# Patient Record
Sex: Male | Born: 1960 | Race: White | Hispanic: No | Marital: Married | Smoking: Never smoker
Health system: Southern US, Community
[De-identification: ages and names within clinical notes are randomized; demographics above are authoritative.]

## PROBLEM LIST (undated history)

## (undated) DIAGNOSIS — F419 Anxiety disorder, unspecified: Secondary | ICD-10-CM

## (undated) DIAGNOSIS — R12 Heartburn: Secondary | ICD-10-CM

## (undated) DIAGNOSIS — I214 Non-ST elevation (NSTEMI) myocardial infarction: Secondary | ICD-10-CM

## (undated) DIAGNOSIS — I6529 Occlusion and stenosis of unspecified carotid artery: Secondary | ICD-10-CM

## (undated) DIAGNOSIS — R0789 Other chest pain: Secondary | ICD-10-CM

## (undated) DIAGNOSIS — K219 Gastro-esophageal reflux disease without esophagitis: Secondary | ICD-10-CM

## (undated) DIAGNOSIS — E7849 Other hyperlipidemia: Secondary | ICD-10-CM

## (undated) DIAGNOSIS — E785 Hyperlipidemia, unspecified: Secondary | ICD-10-CM

## (undated) HISTORY — DX: Other chest pain: R07.89

## (undated) HISTORY — DX: Gastro-esophageal reflux disease without esophagitis: K21.9

## (undated) HISTORY — PX: HAND SURGERY: SHX662

## (undated) HISTORY — PX: TONSILLECTOMY: SUR1361

## (undated) HISTORY — DX: Occlusion and stenosis of unspecified carotid artery: I65.29

## (undated) HISTORY — DX: Anxiety disorder, unspecified: F41.9

## (undated) HISTORY — DX: Non-ST elevation (NSTEMI) myocardial infarction: I21.4

## (undated) HISTORY — PX: CERVICAL SPINE SURGERY: SHX589

---

## 2008-04-10 ENCOUNTER — Emergency Department (HOSPITAL_COMMUNITY): Admission: EM | Admit: 2008-04-10 | Discharge: 2008-04-10 | Payer: Self-pay | Admitting: Emergency Medicine

## 2009-09-24 ENCOUNTER — Encounter: Admission: RE | Admit: 2009-09-24 | Discharge: 2009-09-24 | Payer: Self-pay | Admitting: Chiropractor

## 2009-10-05 ENCOUNTER — Ambulatory Visit (HOSPITAL_COMMUNITY): Admission: RE | Admit: 2009-10-05 | Discharge: 2009-10-06 | Payer: Self-pay | Admitting: Neurological Surgery

## 2010-02-20 ENCOUNTER — Encounter: Payer: Self-pay | Admitting: Chiropractor

## 2010-04-14 LAB — COMPREHENSIVE METABOLIC PANEL
ALT: 71 U/L — ABNORMAL HIGH (ref 0–53)
Alkaline Phosphatase: 45 U/L (ref 39–117)
BUN: 16 mg/dL (ref 6–23)
CO2: 26 mEq/L (ref 19–32)
Chloride: 103 mEq/L (ref 96–112)
Creatinine, Ser: 1.05 mg/dL (ref 0.4–1.5)
Glucose, Bld: 95 mg/dL (ref 70–99)
Sodium: 136 mEq/L (ref 135–145)
Total Bilirubin: 1 mg/dL (ref 0.3–1.2)
Total Protein: 6.5 g/dL (ref 6.0–8.3)

## 2010-04-14 LAB — CBC
Hemoglobin: 15.6 g/dL (ref 13.0–17.0)
Platelets: 302 10*3/uL (ref 150–400)
RBC: 5.14 MIL/uL (ref 4.22–5.81)
RDW: 12.9 % (ref 11.5–15.5)
WBC: 10.5 10*3/uL (ref 4.0–10.5)

## 2010-05-12 LAB — COMPREHENSIVE METABOLIC PANEL
AST: 33 U/L (ref 0–37)
Albumin: 4.3 g/dL (ref 3.5–5.2)
Alkaline Phosphatase: 64 U/L (ref 39–117)
BUN: 20 mg/dL (ref 6–23)
CO2: 27 mEq/L (ref 19–32)
Calcium: 9.5 mg/dL (ref 8.4–10.5)
Chloride: 104 mEq/L (ref 96–112)
GFR calc Af Amer: >60 mL/min (ref >60)
GFR calc non Af Amer: >60 mL/min (ref >60)
Glucose, Bld: 113 mg/dL — ABNORMAL HIGH (ref 70–99)
Potassium: 4.4 mEq/L (ref 3.5–5.1)
Total Bilirubin: 0.9 mg/dL (ref 0.3–1.2)
Total Protein: 6.9 g/dL (ref 6.0–8.3)

## 2010-05-12 LAB — CBC
HCT: 44.5 % (ref 39.0–52.0)
Platelets: 270 10*3/uL (ref 150–400)
RBC: 5 MIL/uL (ref 4.22–5.81)
WBC: 5 10*3/uL (ref 4.0–10.5)

## 2010-05-12 LAB — DIFFERENTIAL
Eosinophils Absolute: 0.1 10*3/uL (ref 0.0–0.7)
Lymphs Abs: 1.6 10*3/uL (ref 0.7–4.0)
Monocytes Relative: 9 % (ref 3–12)
Neutro Abs: 2.8 10*3/uL (ref 1.7–7.7)
Neutrophils Relative %: 57 % (ref 43–77)

## 2013-01-10 ENCOUNTER — Encounter (HOSPITAL_COMMUNITY): Payer: Self-pay | Admitting: Emergency Medicine

## 2013-01-10 ENCOUNTER — Inpatient Hospital Stay (HOSPITAL_COMMUNITY)
Admission: EM | Admit: 2013-01-10 | Discharge: 2013-01-20 | DRG: 234 | Disposition: A | Discharge status: Home / Self Care | Payer: BC Managed Care – PPO | Attending: Cardiothoracic Surgery | Admitting: Cardiothoracic Surgery

## 2013-01-10 ENCOUNTER — Emergency Department (HOSPITAL_COMMUNITY): Payer: BC Managed Care – PPO

## 2013-01-10 DIAGNOSIS — I200 Unstable angina: Secondary | ICD-10-CM

## 2013-01-10 DIAGNOSIS — F411 Generalized anxiety disorder: Secondary | ICD-10-CM | POA: Diagnosis present

## 2013-01-10 DIAGNOSIS — E871 Hypo-osmolality and hyponatremia: Secondary | ICD-10-CM | POA: Diagnosis present

## 2013-01-10 DIAGNOSIS — D62 Acute posthemorrhagic anemia: Secondary | ICD-10-CM | POA: Diagnosis not present

## 2013-01-10 DIAGNOSIS — R0789 Other chest pain: Secondary | ICD-10-CM

## 2013-01-10 DIAGNOSIS — Z79899 Other long term (current) drug therapy: Secondary | ICD-10-CM

## 2013-01-10 DIAGNOSIS — D696 Thrombocytopenia, unspecified: Secondary | ICD-10-CM | POA: Diagnosis not present

## 2013-01-10 DIAGNOSIS — Z951 Presence of aortocoronary bypass graft: Secondary | ICD-10-CM

## 2013-01-10 DIAGNOSIS — I214 Non-ST elevation (NSTEMI) myocardial infarction: Principal | ICD-10-CM | POA: Diagnosis present

## 2013-01-10 DIAGNOSIS — F419 Anxiety disorder, unspecified: Secondary | ICD-10-CM

## 2013-01-10 DIAGNOSIS — I2510 Atherosclerotic heart disease of native coronary artery without angina pectoris: Secondary | ICD-10-CM | POA: Diagnosis present

## 2013-01-10 DIAGNOSIS — E8779 Other fluid overload: Secondary | ICD-10-CM | POA: Diagnosis not present

## 2013-01-10 DIAGNOSIS — K219 Gastro-esophageal reflux disease without esophagitis: Secondary | ICD-10-CM | POA: Diagnosis present

## 2013-01-10 DIAGNOSIS — E7849 Other hyperlipidemia: Secondary | ICD-10-CM

## 2013-01-10 DIAGNOSIS — E785 Hyperlipidemia, unspecified: Secondary | ICD-10-CM | POA: Diagnosis present

## 2013-01-10 DIAGNOSIS — Z789 Other specified health status: Secondary | ICD-10-CM

## 2013-01-10 DIAGNOSIS — Z7982 Long term (current) use of aspirin: Secondary | ICD-10-CM

## 2013-01-10 DIAGNOSIS — A6000 Herpesviral infection of urogenital system, unspecified: Secondary | ICD-10-CM | POA: Diagnosis present

## 2013-01-10 HISTORY — DX: Heartburn: R12

## 2013-01-10 HISTORY — DX: Other hyperlipidemia: E78.49

## 2013-01-10 HISTORY — DX: Other specified health status: Z78.9

## 2013-01-10 HISTORY — DX: Hyperlipidemia, unspecified: E78.5

## 2013-01-10 LAB — BASIC METABOLIC PANEL
BUN: 17 mg/dL (ref 6–23)
GFR calc Af Amer: 76 mL/min — ABNORMAL LOW (ref >90)

## 2013-01-10 LAB — CBC
MCV: 88 fL (ref 78.0–100.0)
WBC: 7.3 10*3/uL (ref 4.0–10.5)

## 2013-01-10 LAB — PRO B NATRIURETIC PEPTIDE: Pro B Natriuretic peptide (BNP): 25.7 pg/mL (ref 0–125)

## 2013-01-10 LAB — TROPONIN I: Troponin I: 1.62 ng/mL (ref <0.30)

## 2013-01-10 LAB — PROTIME-INR
INR: 1.03 (ref 0.00–1.49)
Prothrombin Time: 13.3 seconds (ref 11.6–15.2)

## 2013-01-10 LAB — POCT I-STAT TROPONIN I: Troponin i, poc: 0.03 ng/mL (ref 0.00–0.08)

## 2013-01-10 MED ORDER — ASPIRIN 81 MG PO CHEW
324.0000 mg | CHEWABLE_TABLET | Freq: Once | ORAL | Status: AC
  Administered 2013-01-10: 324 mg via ORAL
  Filled 2013-01-10: qty 4

## 2013-01-10 MED ORDER — ASPIRIN EC 325 MG PO TBEC
325.0000 mg | DELAYED_RELEASE_TABLET | Freq: Every day | ORAL | Status: DC
  Administered 2013-01-11: 325 mg via ORAL
  Filled 2013-01-10: qty 1

## 2013-01-10 MED ORDER — HEPARIN (PORCINE) IN NACL 100-0.45 UNIT/ML-% IJ SOLN
1200.0000 [IU]/h | INTRAMUSCULAR | Status: DC
  Administered 2013-01-11: 1200 [IU]/h via INTRAVENOUS
  Filled 2013-01-10 (×2): qty 250

## 2013-01-10 MED ORDER — ONDANSETRON HCL 4 MG PO TABS: 4.0000 mg | ORAL_TABLET | Freq: Four times a day (QID) | ORAL | Status: DC | PRN

## 2013-01-10 MED ORDER — HEPARIN BOLUS VIA INFUSION
4000.0000 [IU] | Freq: Once | INTRAVENOUS | Status: AC
  Administered 2013-01-10: 4000 [IU] via INTRAVENOUS
  Filled 2013-01-10: qty 4000

## 2013-01-10 MED ORDER — NITROGLYCERIN 0.4 MG SL SUBL
0.4000 mg | SUBLINGUAL_TABLET | SUBLINGUAL | Status: DC | PRN
  Administered 2013-01-10: 0.4 mg via SUBLINGUAL
  Filled 2013-01-10: qty 25

## 2013-01-10 MED ORDER — LORATADINE 10 MG PO TABS
10.0000 mg | ORAL_TABLET | Freq: Every day | ORAL | Status: DC
  Administered 2013-01-11 – 2013-01-14 (×4): 10 mg via ORAL
  Filled 2013-01-10 (×5): qty 1

## 2013-01-10 MED ORDER — SODIUM CHLORIDE 0.9 % IJ SOLN
3.0000 mL | Freq: Two times a day (BID) | INTRAMUSCULAR | Status: DC
  Administered 2013-01-13: 3 mL via INTRAVENOUS

## 2013-01-10 MED ORDER — ACYCLOVIR 200 MG PO CAPS
400.0000 mg | ORAL_CAPSULE | Freq: Every day | ORAL | Status: DC
  Administered 2013-01-11 – 2013-01-15 (×5): 400 mg via ORAL
  Filled 2013-01-10 (×7): qty 2

## 2013-01-10 MED ORDER — ENOXAPARIN SODIUM 40 MG/0.4ML ~~LOC~~ SOLN: 40.0000 mg | SUBCUTANEOUS | Status: DC

## 2013-01-10 MED ORDER — SODIUM CHLORIDE 0.9 % IV SOLN: 250.0000 mL | INTRAVENOUS | Status: DC | PRN

## 2013-01-10 MED ORDER — CLONAZEPAM 0.5 MG PO TABS
0.5000 mg | ORAL_TABLET | Freq: Every day | ORAL | Status: DC
  Administered 2013-01-11 – 2013-01-15 (×5): 0.5 mg via ORAL
  Filled 2013-01-10 (×5): qty 1

## 2013-01-10 MED ORDER — PANTOPRAZOLE SODIUM 40 MG PO TBEC
40.0000 mg | DELAYED_RELEASE_TABLET | Freq: Every day | ORAL | Status: DC
  Administered 2013-01-10 – 2013-01-14 (×5): 40 mg via ORAL
  Filled 2013-01-10 (×5): qty 1

## 2013-01-10 MED ORDER — ONDANSETRON HCL 4 MG/2ML IJ SOLN: 4.0000 mg | Freq: Four times a day (QID) | INTRAMUSCULAR | Status: DC | PRN

## 2013-01-10 MED ORDER — MORPHINE SULFATE 2 MG/ML IJ SOLN
2.0000 mg | INTRAMUSCULAR | Status: DC | PRN
  Administered 2013-01-13: 2 mg via INTRAVENOUS
  Filled 2013-01-10: qty 1

## 2013-01-10 MED ORDER — SODIUM CHLORIDE 0.9 % IJ SOLN
3.0000 mL | INTRAMUSCULAR | Status: DC | PRN
  Administered 2013-01-12 (×2): 3 mL via INTRAVENOUS

## 2013-01-10 MED ORDER — SODIUM CHLORIDE 0.9 % IJ SOLN
3.0000 mL | Freq: Two times a day (BID) | INTRAMUSCULAR | Status: DC
  Administered 2013-01-12 – 2013-01-13 (×2): 3 mL via INTRAVENOUS
  Administered 2013-01-14: 09:00:00 via INTRAVENOUS

## 2013-01-10 NOTE — Progress Notes (Signed)
CRITICAL VALUE ALERT  Critical value received:  Troponin 1.62  Date of notification:  01/10/13  Time of notification:  1025  Critical value read back:yes  Nurse who received alert:  Brylie Sneath, Lavone Orn, RN  MD notified (1st page):  Lynch  Time of first page:  1027  MD notified (2nd page):  Time of second page:  Responding MD:  Burnadette Peter  Time MD responded:  1029

## 2013-01-10 NOTE — Consult Note (Signed)
Admit date: 01/10/2013 Referring Physician  Dr. Blinda Leatherwood Primary Physician  Dr. Cam Hai   Primary Cardiologist  None Reason for Consultation  Chest pain and SOB  HPI: This is a 52yo WM with a history of heartburn who presented to the ER with complaints of chest pain.  His discomfort started at 3pm while out riding his bike on a mountain bike trail. He said that about 20 minutes into it he started to feel bad with SOB but he rode another 25 minutes but his chest started burning with some pain and it became  worse and he finally had to stop.  He sat down and the chest burning continued and actually got worse.  He described it has a burning sensation across his chest and felt he had a lot of acid and was spitting a lot.  This burning sensation moved into his left arm along with some numbness.  He says that for about 2-3 weeks he has had some pressure on his chest with cough when lying down in certain positions.  He has known GERD treated by ENT Dr. Pollyann Kennedy.  He denies any SOB or DOE prior to today.  He is very active and is an avid biker.  He was seen by his PCP a few weeks ago and they though he was having pleurisy and was coughing some phlegm.       PMH:   Past Medical History  Diagnosis Date  . Heart burn Dyslipidemia      PSH:   Past Surgical History  Procedure Laterality Date  . Cervical spine surgery  approx 2011    C3,C4, C5 diskectomy  . Tonsillectomy      Allergies:  Review of patient's allergies indicates no known allergies. Prior to Admit Meds:   (Not in a hospital admission) Fam HX:    Family History  Problem Relation Age of Onset  . Heart attack Father   . Heart disease Father   . Hypertension Father   . Hyperlipidemia Father    Social HX:    History   Social History  . Marital Status: Married    Spouse Name: N/A    Number of Children: N/A  . Years of Education: N/A   Occupational History  . Not on file.   Social History Main Topics  . Smoking status:  Never Smoker   . Smokeless tobacco: Never Used  . Alcohol Use: Yes     Comment: occasional  . Drug Use: No  . Sexual Activity: Not on file   Other Topics Concern  . Not on file   Social History Narrative  . No narrative on file     ROS:  All 11 ROS were addressed and are negative except what is stated in the HPI  Physical Exam: Blood pressure 126/82, pulse 59, temperature 98 F (36.7 C), temperature source Oral, resp. rate 14, SpO2 99.00%.    General: Well developed, well nourished, in no acute distress Head: Eyes PERRLA, No xanthomas.   Normal cephalic and atramatic  Lungs:   Clear bilaterally to auscultation and percussion. Heart:   HRRR S1 S2 Pulses are 2+ & equal.            No carotid bruit. No JVD.  No abdominal bruits. No femoral bruits. Abdomen: Bowel sounds are positive, abdomen soft and non-tender without masses  Extremities:   No clubbing, cyanosis or edema.  DP +1 Neuro: Alert and oriented X 3. Psych:  Good affect, responds appropriately  Labs:   Lab Results  Component Value Date   WBC 7.3 01/10/2013   HGB 14.7 01/10/2013   HCT 42.4 01/10/2013   MCV 88.0 01/10/2013   PLT 268 01/10/2013     Recent Labs Lab 01/10/13 1707  NA 132*  K 3.9  CL 95*  CO2 25  BUN 17  CREATININE 1.22  CALCIUM 9.4  GLUCOSE 125*       Radiology:  Dg Chest Port 1 View  01/10/2013   CLINICAL DATA:  Chest pain  EXAM: PORTABLE CHEST - 1 VIEW  COMPARISON:  12/23/2012  FINDINGS: The heart size and mediastinal contours are within normal limits. Both lungs are clear. The visualized skeletal structures are unremarkable.  IMPRESSION: No active disease.   Electronically Signed   By: Alcide Clever M.D.   On: 01/10/2013 17:31    EKG: NSR with diffuse ST segment depression on initial   ASSESSMENT:  1.  Unstable angina with negative troponin x 1.  His initial EKG showed diffuse ST depression but second EKG showed resolution of ST changes - CRF include age, male sex, dyslipidemia  and family history of CAD.  Other differential diagnosis includes pericarditis, GERD or musculoskeletal pain.  His pain was somewhat exacerbated by bending forward and was improved with SL NTG and he has a history of GERD with chronic cough.   2.  dyslipidemia  PLAN:   1.  Continue to cycle cardiac enzymes 2.  IV Heparin gtt until rule out complete 3.  2D echo in am to assess LVF and rule out pericardial effusion 4.  NPO after midnight 5.  If he rules out for MI then Stress myoview in am 6.  Increase Nexium to 40mg  BID  Quintella Reichert, MD  01/10/2013  8:30 PM

## 2013-01-10 NOTE — Progress Notes (Signed)
ANTICOAGULATION CONSULT NOTE - Initial Consult  Pharmacy Consult for IV Heparin Indication: chest pain/ACS  No Known Allergies  Patient Measurements: Height: 5\' 11"  (180.3 cm) Weight: 166 lb 14.2 oz (75.7 kg) IBW/kg (Calculated) : 75.3 Heparin Dosing Weight: 75.7 kg  Vital Signs: Temp: 98.7 F (37.1 C) (12/12 2125) Temp src: Oral (12/12 2125) BP: 142/90 mmHg (12/12 2125) Pulse Rate: 59 (12/12 2125)  Labs:  Recent Labs  01/10/13 1707  HGB 14.7  HCT 42.4  PLT 268  CREATININE 1.22    Estimated Creatinine Clearance: 73.7 ml/min (by C-G formula based on Cr of 1.22).   Medical History: Past Medical History  Diagnosis Date  . Heart burn   . Hyperlipidemia     statin intolerant    Medications:  Scheduled:  . acyclovir  400 mg Oral QHS  . [START ON 01/11/2013] aspirin EC  325 mg Oral Daily  . clonazePAM  0.5 mg Oral QHS  . heparin  4,000 Units Intravenous Once  . [START ON 01/11/2013] loratadine  10 mg Oral Daily  . pantoprazole  40 mg Oral Daily  . sodium chloride  3 mL Intravenous Q12H  . sodium chloride  3 mL Intravenous Q12H   Infusions:  . heparin      Assessment: 52 yo hx heartburn c/o chest pain.  Initial troponin= 0.03.  Cardiology ordering IV heparin per Rx until rule out complete. Goal of Therapy:  Heparin level 0.3-0.7 units/ml Monitor platelets by anticoagulation protocol: Yes   Plan:   Baseline coags/wt/ht stat  Heparin 4000 units IV x1  Start drip @ 900 units/hr  Daily CBC/HL  Check 1st HL 0500 12/13  Lorenza Evangelist 01/10/2013,9:33 PM

## 2013-01-10 NOTE — ED Notes (Signed)
Attempted to call report to floor, accepting nurse in patient room Call back number given

## 2013-01-10 NOTE — H&P (Signed)
[facility] History and Physical  Andrew Sampson:[id number] DOB: 1961-01-10    PCP  Cam Hai.  Chief Complaint: chest pain radiating to left shoulder.  HPI: Andrew Sampson is an 52 y.o. male with hx of GERD, cervical diskectomy, hx of hyperlipidemia with statin intolerance, genital herpes, presents to the ER with slight substernal pressure and left shoulder radiation.  He didn't have shortness of breath.  There was no pleuritic component to his chest pain.  He denied any history of exertional chest pain and has been very active with riding his mountain bike three times per week.  He denied tobacco use nor drug use, but has a stressful job as an Pensions consultant.  Work up in the ER included a clear CXR, EKG with slight ST depression in the precordial lead, and T wave inversion on the inferolateral leads.  His initial troponin was negative.  His renal fx was normal.  Cardiology was consulted, and hospitalist was asked to admit him.  Rewiew of Systems:  Constitutional: Negative for malaise, fever and chills. No significant weight loss or weight gain Eyes: Negative for eye pain, redness and discharge, diplopia, visual changes, or flashes of light. ENMT: Negative for ear pain, hoarseness, nasal congestion, sinus pressure and sore throat. No headaches; tinnitus, drooling, or problem swallowing. Cardiovascular: Negative for palpitations, diaphoresis, dyspnea and peripheral edema. ; No orthopnea, PND Respiratory: Negative for cough, hemoptysis, wheezing and stridor. No pleuritic chestpain. Gastrointestinal: Negative for nausea, vomiting, diarrhea, constipation, abdominal pain, melena, blood in stool, hematemesis, jaundice and rectal bleeding.    Genitourinary: Negative for frequency, dysuria, incontinence,flank pain and hematuria; Musculoskeletal: Negative for back pain and neck pain. Negative for swelling and trauma.;  Skin: . Negative for pruritus, rash, abrasions, bruising and skin lesion.;  ulcerations Neuro: Negative for headache, lightheadedness and neck stiffness. Negative for weakness, altered level of consciousness , altered mental status, extremity weakness, burning feet, involuntary movement, seizure and syncope.  Psych: negative for  depression, insomnia, tearfulness, panic attacks, hallucinations, paranoia, suicidal or homicidal ideation    Past Medical History  Diagnosis Date  . Heart burn     Past Surgical History  Procedure Laterality Date  . Cervical spine surgery  approx 2011    C3,C4, C5 diskectomy    Medications:  HOME MEDS: Prior to Admission medications   Medication Sig Start Date End Date Taking? Authorizing Provider  acyclovir (ZOVIRAX) 200 MG capsule Take 400 mg by mouth at bedtime.   Yes Historical Provider, MD  azithromycin (ZITHROMAX) 250 MG tablet Take 250 mg by mouth daily. z-pack. 01/04/13  Yes Historical Provider, MD  clonazePAM (KLONOPIN) 0.5 MG tablet Take 0.5 mg by mouth at bedtime.   Yes Historical Provider, MD  esomeprazole (NEXIUM) 40 MG capsule Take 40 mg by mouth daily at 12 noon.   Yes Historical Provider, MD  loratadine (CLARITIN) 10 MG tablet Take 10 mg by mouth daily.   Yes Historical Provider, MD     Allergies:  No Known Allergies  Social History:   reports that he has never smoked. He has never used smokeless tobacco. He reports that he drinks alcohol. He reports that he does not use illicit drugs.  Family History: History reviewed. No pertinent family history.   Physical Exam: Filed Vitals:   01/10/13 1642 01/10/13 1933 01/10/13 1937  BP: 163/107 126/82   Pulse: 75 59   Temp: 97.8 F (36.6 C)  98 F (36.7 C)  TempSrc: Oral    Resp: 16 14  SpO2: 99% 99%    Blood pressure 126/82, pulse 59, temperature 98 F (36.7 C), temperature source Oral, resp. rate 14, SpO2 99.00%.  GEN:  Pleasant patient lying in the stretcher in no acute distress; cooperative with exam. PSYCH:  alert and oriented x4; does not appear  anxious or depressed; affect is appropriate. HEENT: Mucous membranes pink and anicteric; PERRLA; EOM intact; no cervical lymphadenopathy nor thyromegaly or carotid bruit; no JVD; There were no stridor. Neck is very supple. Breasts:: Not examined CHEST WALL: No tenderness CHEST: Normal respiration, clear to auscultation bilaterally.  HEART: Regular rate and rhythm.  There are no murmur, rub, or gallops.   BACK: No kyphosis or scoliosis; no CVA tenderness ABDOMEN: soft and non-tender; no masses, no organomegaly, normal abdominal bowel sounds; no pannus; no intertriginous candida. There is no rebound and no distention. Rectal Exam: Not done EXTREMITIES: No bone or joint deformity; age-appropriate arthropathy of the hands and knees; no edema; no ulcerations.  There is no calf tenderness. Genitalia: not examined PULSES: 2+ and symmetric SKIN: Normal hydration no rash or ulceration CNS: Cranial nerves 2-12 grossly intact no focal lateralizing neurologic deficit.  Speech is fluent; uvula elevated with phonation, facial symmetry and tongue midline. DTR are normal bilaterally, cerebella exam is intact, barbinski is negative and strengths are equaled bilaterally.  No sensory loss.   Labs on Admission:  Basic Metabolic Panel:  Recent Labs Lab 01/10/13 1707  NA 132*  K 3.9  CL 95*  CO2 25  GLUCOSE 125*  BUN 17  CREATININE 1.22  CALCIUM 9.4   Liver Function Tests: No results found for this basename: AST, ALT, ALKPHOS, BILITOT, PROT, ALBUMIN,  in the last 168 hours No results found for this basename: LIPASE, AMYLASE,  in the last 168 hours No results found for this basename: AMMONIA,  in the last 168 hours CBC:  Recent Labs Lab 01/10/13 1707  WBC 7.3  HGB 14.7  HCT 42.4  MCV 88.0  PLT 268   Cardiac Enzymes: No results found for this basename: CKTOTAL, CKMB, CKMBINDEX, TROPONINI,  in the last 168 hours  CBG: No results found for this basename: GLUCAP,  in the last 168  hours   Radiological Exams on Admission: Dg Chest Port 1 View  01/10/2013   CLINICAL DATA:  Chest pain  EXAM: PORTABLE CHEST - 1 VIEW  COMPARISON:  12/23/2012  FINDINGS: The heart size and mediastinal contours are within normal limits. Both lungs are clear. The visualized skeletal structures are unremarkable.  IMPRESSION: No active disease.   Electronically Signed   By: Alcide Clever M.D.   On: 01/10/2013 17:31    EKG: Independently reviewed. NRS with nonspecific ST-T changes.   Assessment/Plan Present on Admission:  . Atypical chest pain . Statin intolerance . GERD (gastroesophageal reflux disease) . Anxiety  PLAN:  Will admit him for atypical chest pain with increase CRFs including stressful life style and hx of hyperlipidemia, and slight EKG change.  His BP and HR is in good range at this time.  He is pain free.  Will give ASA, along with continuation of his PPI.  Cardiology was consulted and Dr Mayford Knife is seeing patient now. I suspect he would benefit getting a cardiac catherization, but will defer to cardiology.  I have ordered an ECHO and fasting lipid profile.  He is stable, full code, and was admitted to [facility] service.  I did discuss cardiac risk modifications.  Thank you for asking me to participate in his care.  Other plans as per orders.  Code Status: FULL Unk Lightning, MD. [facility] Pager [phone] 7pm to 7am.  01/10/2013, 8:21 PM

## 2013-01-10 NOTE — ED Notes (Signed)
Assumed care of patient Introduced self to patient and pt's family Patient reports that left chest pain/discomfort was relieved after admin of 1 tab SL NTG, see MAR Patient currently denies pain, but states "I can tell a slight difference between my left side and my right." Patient states that he believes symptoms could be r/t "I didn't take my Nexium today." LCTA and patient appears in NAD VS updated and stable Patient denies further needs at this time Side rails up, call bell in reach

## 2013-01-10 NOTE — ED Notes (Signed)
Pt was riding his mountain bike today and started having shob and left side chest pain and numbness.  Pt states that he normally is an easy ride for him.  Pt states that he has been having intermittent chest pain for couple weeks and seen his PCP about it and think related to heartburn and been taking his meds as prescribed.

## 2013-01-10 NOTE — ED Provider Notes (Addendum)
CSN: [id number]     Arrival date & time 01/10/13  1635 History   First MD Initiated Contact with Patient 01/10/13 1708     Chief Complaint  Patient presents with  . Chest Pain  . Shortness of Breath   (Consider location/radiation/quality/duration/timing/severity/associated sxs/prior Treatment) HPI Comments: Patient presents to the ER for evaluation of difficulty breathing and chest pain. Patient reports that he was smiling when the symptoms began. Patient is very active and bikes 3 times a week. In the last couple of weeks, however, he has not been feeling well. He has been having some intermittent episodes of chest discomfort which was felt to be secondary to heartburn. He has been riding much because of these symptoms. He went out today and about 40 minutes into his ride, which he normally does for 2 hours, he started feeling very short of breath. This was followed by pain in the left side of his chest which went to the left arm. It improved when he stopped biking. He has never had these symptoms before.  Patient reports previous history of high cholesterol, untreated because he does not tolerate statins. He has a family history of heart disease in both his parents, but in later years. He also reports that he does not have a history of high blood pressure, except the last couple of weeks his blood pressure has been somewhat high.  Patient is a 52 y.o. male presenting with chest pain and shortness of breath.  Chest Pain Associated symptoms: shortness of breath   Shortness of Breath Associated symptoms: chest pain     Past Medical History  Diagnosis Date  . Heart burn    Past Surgical History  Procedure Laterality Date  . Back surgery     No family history on file. History  Substance Use Topics  . Smoking status: Never Smoker   . Smokeless tobacco: Never Used  . Alcohol Use: No    Review of Systems  Respiratory: Positive for shortness of breath.   Cardiovascular: Positive for  chest pain.  All other systems reviewed and are negative.    Allergies  Review of patient's allergies indicates no known allergies.  Home Medications  No current outpatient prescriptions on file. BP 163/107  Pulse 75  Temp(Src) 97.8 F (36.6 C) (Oral)  Resp 16  SpO2 99% Physical Exam  Constitutional: He is oriented to person, place, and time. He appears well-developed and well-nourished. No distress.  HENT:  Head: Normocephalic and atraumatic.  Right Ear: Hearing normal.  Left Ear: Hearing normal.  Nose: Nose normal.  Mouth/Throat: Oropharynx is clear and moist and mucous membranes are normal.  Eyes: Conjunctivae and EOM are normal. Pupils are equal, round, and reactive to light.  Neck: Normal range of motion. Neck supple.  Cardiovascular: Regular rhythm, S1 normal and S2 normal.  Exam reveals no gallop and no friction rub.   No murmur heard. Pulmonary/Chest: Effort normal and breath sounds normal. No respiratory distress. He exhibits no tenderness.  Abdominal: Soft. Normal appearance and bowel sounds are normal. There is no hepatosplenomegaly. There is no tenderness. There is no rebound, no guarding, no tenderness at McBurney's point and negative Murphy's sign. No hernia.  Musculoskeletal: Normal range of motion.  Neurological: He is alert and oriented to person, place, and time. He has normal strength. No cranial nerve deficit or sensory deficit. Coordination normal. GCS eye subscore is 4. GCS verbal subscore is 5. GCS motor subscore is 6.  Skin: Skin is warm, dry and  intact. No rash noted. No cyanosis.  Psychiatric: He has a normal mood and affect. His speech is normal and behavior is normal. Thought content normal.    ED Course  Procedures (including critical care time) Labs Review Labs Reviewed  BASIC METABOLIC PANEL - Abnormal; Notable for the following:    Sodium 132 (*)    Chloride 95 (*)    Glucose, Bld 125 (*)    GFR calc non Af Amer 66 (*)    GFR calc Af Amer  76 (*)    All other components within normal limits  CBC  PRO B NATRIURETIC PEPTIDE  POCT I-STAT TROPONIN I   Imaging Review Dg Chest Port 1 View  01/10/2013   CLINICAL DATA:  Chest pain  EXAM: PORTABLE CHEST - 1 VIEW  COMPARISON:  12/23/2012  FINDINGS: The heart size and mediastinal contours are within normal limits. Both lungs are clear. The visualized skeletal structures are unremarkable.  IMPRESSION: No active disease.   Electronically Signed   By: Alcide Clever M.D.   On: 01/10/2013 17:31    EKG Interpretation    Date/Time:  Friday January 10 2013 16:58:53 EST Ventricular Rate:  75 PR Interval:  121 QRS Duration: 89 QT Interval:  383 QTC Calculation: 428 R Axis:   32 Text Interpretation:  Ectopic atrial rhythm ST segment depression in inf-lateral leads            EKG Interpretation    Date/Time:  Friday January 10 2013 18:33:49 EST Ventricular Rate:  68 PR Interval:  130 QRS Duration: 83 QT Interval:  398 QTC Calculation: 423 R Axis:   32 Text Interpretation:  Ectopic atrial rhythm ST segments now at baseline, compared to prior depression Confirmed by Samie Barclift  MD, Maeola Mchaney ([id number]) on 01/10/2013 6:43:23 PM            MDM  Diagnosis: Chest Pain  Patient presents to the ER for evaluation of exertional chest pain. Patient had not been feeling well for the last several weeks, thinking that he had problems with acid reflux. He was bicycling today and had onset of shortness of breath followed by left-sided chest pain, including radiation to the left arm. The symptoms improve with rest. He is extremely active, not mics 3 times a week for 2 hours at a time. He has never had similar symptoms with exertion before. Patient does have cardiac risk factors. He has history and also untreated hypercholesterolemia. The patient's EKG arrival did show inferior lateral sagging T-wave depressions. His troponin was negative. After the patient was pain-free, repeat EKG shows  normal ST segments. Patient will require further evaluation for possible cardiac etiology to his chest pain.    Gilda Crease, MD 01/10/13 1842  Gilda Crease, MD 01/10/13 [phone]

## 2013-01-11 ENCOUNTER — Encounter (HOSPITAL_COMMUNITY)
Admission: EM | Disposition: A | Discharge status: Home / Self Care | Payer: Self-pay | Source: Home / Self Care | Attending: Cardiothoracic Surgery

## 2013-01-11 DIAGNOSIS — E871 Hypo-osmolality and hyponatremia: Secondary | ICD-10-CM

## 2013-01-11 DIAGNOSIS — Z888 Allergy status to other drugs, medicaments and biological substances status: Secondary | ICD-10-CM

## 2013-01-11 DIAGNOSIS — I359 Nonrheumatic aortic valve disorder, unspecified: Secondary | ICD-10-CM

## 2013-01-11 DIAGNOSIS — I214 Non-ST elevation (NSTEMI) myocardial infarction: Secondary | ICD-10-CM

## 2013-01-11 DIAGNOSIS — I2510 Atherosclerotic heart disease of native coronary artery without angina pectoris: Secondary | ICD-10-CM

## 2013-01-11 HISTORY — PX: LEFT HEART CATH: SHX5478

## 2013-01-11 LAB — TROPONIN I
Troponin I: 8.31 ng/mL (ref <0.30)
Troponin I: 14.58 ng/mL (ref <0.30)

## 2013-01-11 LAB — LIPID PANEL
Cholesterol: 273 mg/dL — ABNORMAL HIGH (ref 0–200)
HDL: 17 mg/dL — ABNORMAL LOW (ref >39)
LDL Cholesterol: 214 mg/dL — ABNORMAL HIGH (ref 0–99)
Triglycerides: 209 mg/dL — ABNORMAL HIGH (ref <150)

## 2013-01-11 LAB — TSH: TSH: 0.729 u[IU]/mL (ref 0.350–4.500)

## 2013-01-11 LAB — MRSA PCR SCREENING: MRSA by PCR: NEGATIVE

## 2013-01-11 SURGERY — LEFT HEART CATH
Anesthesia: LOCAL

## 2013-01-11 MED ORDER — METOPROLOL TARTRATE 12.5 MG HALF TABLET
12.5000 mg | ORAL_TABLET | ORAL | Status: DC
  Filled 2013-01-11: qty 1

## 2013-01-11 MED ORDER — FENTANYL CITRATE 0.05 MG/ML IJ SOLN
INTRAMUSCULAR | Status: AC
  Filled 2013-01-11: qty 2

## 2013-01-11 MED ORDER — SODIUM CHLORIDE 0.9 % IJ SOLN: 3.0000 mL | INTRAMUSCULAR | Status: DC | PRN

## 2013-01-11 MED ORDER — PRASUGREL HCL 10 MG PO TABS: 10.0000 mg | ORAL_TABLET | Freq: Every day | ORAL | Status: DC

## 2013-01-11 MED ORDER — ASPIRIN EC 81 MG PO TBEC
81.0000 mg | DELAYED_RELEASE_TABLET | Freq: Every day | ORAL | Status: DC
  Administered 2013-01-12 – 2013-01-14 (×3): 81 mg via ORAL
  Filled 2013-01-11 (×4): qty 1

## 2013-01-11 MED ORDER — MIDAZOLAM HCL 2 MG/2ML IJ SOLN
INTRAMUSCULAR | Status: AC
  Filled 2013-01-11: qty 2

## 2013-01-11 MED ORDER — ACETAMINOPHEN 325 MG PO TABS
650.0000 mg | ORAL_TABLET | ORAL | Status: DC | PRN
  Administered 2013-01-11 – 2013-01-14 (×5): 650 mg via ORAL
  Filled 2013-01-11 (×5): qty 2

## 2013-01-11 MED ORDER — IBUPROFEN 400 MG PO TABS
400.0000 mg | ORAL_TABLET | Freq: Once | ORAL | Status: AC
  Administered 2013-01-11: 400 mg via ORAL
  Filled 2013-01-11: qty 1

## 2013-01-11 MED ORDER — SODIUM CHLORIDE 0.9 % IV SOLN: INTRAVENOUS | Status: AC

## 2013-01-11 MED ORDER — DIPHENHYDRAMINE HCL 50 MG/ML IJ SOLN
INTRAMUSCULAR | Status: AC
  Filled 2013-01-11: qty 1

## 2013-01-11 MED ORDER — BIVALIRUDIN 250 MG IV SOLR
INTRAVENOUS | Status: AC
  Filled 2013-01-11: qty 250

## 2013-01-11 MED ORDER — HEPARIN (PORCINE) IN NACL 2-0.9 UNIT/ML-% IJ SOLN
INTRAMUSCULAR | Status: AC
  Filled 2013-01-11: qty 1000

## 2013-01-11 MED ORDER — HEPARIN (PORCINE) IN NACL 100-0.45 UNIT/ML-% IJ SOLN
1200.0000 [IU]/h | INTRAMUSCULAR | Status: DC
  Administered 2013-01-12: 1200 [IU]/h via INTRAVENOUS
  Filled 2013-01-11: qty 250

## 2013-01-11 MED ORDER — SODIUM CHLORIDE 0.9 % IV SOLN: 250.0000 mL | INTRAVENOUS | Status: DC | PRN

## 2013-01-11 MED ORDER — HEPARIN BOLUS VIA INFUSION
2300.0000 [IU] | Freq: Once | INTRAVENOUS | Status: AC
  Administered 2013-01-11: 2300 [IU] via INTRAVENOUS
  Filled 2013-01-11: qty 2300

## 2013-01-11 MED ORDER — NITROGLYCERIN 0.2 MG/ML ON CALL CATH LAB
INTRAVENOUS | Status: AC
  Filled 2013-01-11: qty 1

## 2013-01-11 MED ORDER — PRASUGREL HCL 10 MG PO TABS
60.0000 mg | ORAL_TABLET | Freq: Once | ORAL | Status: AC
  Administered 2013-01-11: 60 mg via ORAL
  Filled 2013-01-11: qty 6

## 2013-01-11 MED ORDER — CARVEDILOL 3.125 MG PO TABS
3.1250 mg | ORAL_TABLET | Freq: Two times a day (BID) | ORAL | Status: DC
  Administered 2013-01-11 – 2013-01-14 (×8): 3.125 mg via ORAL
  Filled 2013-01-11 (×12): qty 1

## 2013-01-11 MED ORDER — LIDOCAINE HCL (PF) 1 % IJ SOLN
INTRAMUSCULAR | Status: AC
  Filled 2013-01-11: qty 30

## 2013-01-11 MED ORDER — SODIUM CHLORIDE 0.9 % IJ SOLN: 3.0000 mL | Freq: Two times a day (BID) | INTRAMUSCULAR | Status: DC

## 2013-01-11 MED ORDER — ASPIRIN 81 MG PO CHEW: 81.0000 mg | CHEWABLE_TABLET | ORAL | Status: DC

## 2013-01-11 NOTE — Interval H&P Note (Signed)
History and Physical Interval Note:  01/11/2013 3:39 PM  Andrew Sampson  has presented today for cardiac cath  with the diagnosis of NSTEMI. The various methods of treatment have been discussed with the patient and family. After consideration of risks, benefits and other options for treatment, the patient has consented to  Procedure(s): LEFT HEART CATH (N/A) as a surgical intervention .  The patient's history has been reviewed, patient examined, no change in status, stable for surgery.  I have reviewed the patient's chart and labs.  Questions were answered to the patient's satisfaction.    Cath Lab Visit (complete for each Cath Lab visit)  Clinical Evaluation Leading to the Procedure:   ACS: yes  Non-ACS:    Anginal Classification: CCS IV  Anti-ischemic medical therapy: No Therapy  Non-Invasive Test Results: No non-invasive testing performed  Prior CABG: No previous CABG         [name]

## 2013-01-11 NOTE — Progress Notes (Signed)
Back from the cath lab awake and alert. Instructed not to bend right knee and bedrest emphasized.

## 2013-01-11 NOTE — Progress Notes (Signed)
To the cath lab by bed. 

## 2013-01-11 NOTE — Progress Notes (Signed)
Patient ID: Andrew Sampson, male   DOB: [birth year], 52 y.o.   MRN: [id number]    Subjective:  Denies SSCP, palpitations or Dyspnea Discussed positive troponins with him  Objective:  Filed Vitals:   01/10/13 1933 01/10/13 1937 01/10/13 2125 01/11/13 0726  BP: 126/82  142/90 110/61  Pulse: 59  59 72  Temp:  98 F (36.7 C) 98.7 F (37.1 C) 98.3 F (36.8 C)  TempSrc:   Oral Oral  Resp: 14  16 16   Height:   5\' 11"  (1.803 m)   Weight:   166 lb 14.2 oz (75.7 kg)   SpO2: 99%  100% 97%    Intake/Output from previous day:  Intake/Output Summary (Last 24 hours) at 01/11/13 7829 Last data filed at 01/11/13 5621  Gross per 24 hour  Intake  70.95 ml  Output    620 ml  Net -549.05 ml    Physical Exam: Affect appropriate Healthy:  appears stated age HEENT: normal Neck supple with no adenopathy JVP normal no bruits no thyromegaly Lungs clear with no wheezing and good diaphragmatic motion Heart:  S1/S2 no murmur, no rub, gallop or click PMI normal Abdomen: benighn, BS positve, no tenderness, no AAA no bruit.  No HSM or HJR Distal pulses intact with no bruits No edema Neuro non-focal Skin warm and dry No muscular weakness   Lab Results: Basic Metabolic Panel:  Recent Labs  30/86/57 1707  NA 132*  K 3.9  CL 95*  CO2 25  GLUCOSE 125*  BUN 17  CREATININE 1.22  CALCIUM 9.4   CBC:  Recent Labs  01/10/13 1707  WBC 7.3  HGB 14.7  HCT 42.4  MCV 88.0  PLT 268   Cardiac Enzymes:  Recent Labs  01/10/13 2108 01/11/13 0246  TROPONINI 1.62* 8.31*   Fasting Lipid Panel:  Recent Labs  01/11/13 0246  CHOL 273*  HDL 17*  LDLCALC 214*  TRIG 209*  CHOLHDL 16.1   Thyroid Function Tests:  Recent Labs  01/10/13 2108  TSH 0.729    Imaging: Dg Chest Port 1 View  01/10/2013   CLINICAL DATA:  Chest pain  EXAM: PORTABLE CHEST - 1 VIEW  COMPARISON:  12/23/2012  FINDINGS: The heart size and mediastinal contours are within normal limits. Both lungs are clear. The  visualized skeletal structures are unremarkable.  IMPRESSION: No active disease.   Electronically Signed   By: Alcide Clever M.D.   On: 01/10/2013 17:31    Cardiac Studies:  ECG:  SR normal    Telemetry:  NSR 65-72  Echo: pending  Medications:   . acyclovir  400 mg Oral QHS  . aspirin EC  325 mg Oral Daily  . carvedilol  3.125 mg Oral BID WC  . clonazePAM  0.5 mg Oral QHS  . loratadine  10 mg Oral Daily  . pantoprazole  40 mg Oral Daily  . prasugrel  10 mg Oral Daily  . prasugrel  60 mg Oral Once  . sodium chloride  3 mL Intravenous Q12H  . sodium chloride  3 mL Intravenous Q12H     . heparin 900 Units/hr (01/10/13 2259)    Assessment/Plan:  Chest pain:  Troponin over 8 Transfer to [facility] Continue heparin.  Start beta blocker and effient cath on Monday GERD:  Continue pantoprazole  Discussed cath with patient He is anxious orders written and placed on board at [facility] 01/11/2013, 8:24 AM

## 2013-01-11 NOTE — Progress Notes (Signed)
[facility] PROGRESS NOTE  Andrew Sampson GEX:[id number] DOB: 1960/08/21 DOA: 01/10/2013 PCP: [name], MD  Assessment/Plan: . Non-ST elevation MI/chest pain -Troponins elevated 1.62, 8.3, 14.58 -Continue aspirin, heparin drip, as well as carvedilol and Effient started this a.m. per cardiology -[facility] as per cardiology recommendation; H. and discussed with Dr. Clent Ridges was the accepting hospitalist at [facility] -He is chest pain-free  -Discussed patient's request for cath this weekend with Dr.Nishan  and called [facility]-Talked with Dr. Anner Crete on-call who states their facility does not do non-emergent/elective caths on weekends either>> discussed with patient and he is now agreeable to being transferred to [facility]. -Patient is intolerant to statins so will avoid . GERD (gastroesophageal reflux disease) -Continue PPI  . Anxiety -Continue clonazepam when necessary. . Hyponatremia -mild, Follow recheck in a.m. and further treat accordingly Code Status: full Family Communication: directly with pt at bedside Disposition Plan: transfer to [facility]   Consultants:  Cardiology  Procedures:  Cath planned per cardiology on Monday  Antibiotics:  NONE  HPI/Subjective: Pt denies chest pain and no shortness of breath. Requesting his cath can be done this weekend or if he can be transferred to [facility] it to be done this weekend.  Objective: Filed Vitals:   01/11/13 0726  BP: 110/61  Pulse: 72  Temp: 98.3 F (36.8 C)  Resp: 16    Intake/Output Summary (Last 24 hours) at 01/11/13 1156 Last data filed at 01/11/13 0102  Gross per 24 hour  Intake  70.95 ml  Output    620 ml  Net -549.05 ml   Filed Weights   01/10/13 2125  Weight: 75.7 kg (166 lb 14.2 oz)    Exam:  General: alert & oriented x 3 In NAD Cardiovascular: RRR, nl S1 s2 Respiratory: CTAB Abdomen: soft +BS NT/ND, no masses palpable Extremities: No cyanosis and no edema     Data  Reviewed: Basic Metabolic Panel:  Recent Labs Lab 01/10/13 1707  NA 132*  K 3.9  CL 95*  CO2 25  GLUCOSE 125*  BUN 17  CREATININE 1.22  CALCIUM 9.4   Liver Function Tests: No results found for this basename: AST, ALT, ALKPHOS, BILITOT, PROT, ALBUMIN,  in the last 168 hours No results found for this basename: LIPASE, AMYLASE,  in the last 168 hours No results found for this basename: AMMONIA,  in the last 168 hours CBC:  Recent Labs Lab 01/10/13 1707  WBC 7.3  HGB 14.7  HCT 42.4  MCV 88.0  PLT 268   Cardiac Enzymes:  Recent Labs Lab 01/10/13 2108 01/11/13 0246 01/11/13 0843  TROPONINI 1.62* 8.31* 14.58*   BNP (last 3 results)  Recent Labs  01/10/13 1642  PROBNP 25.7   CBG: No results found for this basename: GLUCAP,  in the last 168 hours  No results found for this or any previous visit (from the past 240 hour(s)).   Studies: Dg Chest Port 1 View  01/10/2013   CLINICAL DATA:  Chest pain  EXAM: PORTABLE CHEST - 1 VIEW  COMPARISON:  12/23/2012  FINDINGS: The heart size and mediastinal contours are within normal limits. Both lungs are clear. The visualized skeletal structures are unremarkable.  IMPRESSION: No active disease.   Electronically Signed   By: Alcide Clever M.D.   On: 01/10/2013 17:31    Scheduled Meds: . acyclovir  400 mg Oral QHS  . aspirin EC  325 mg Oral Daily  . carvedilol  3.125 mg Oral BID WC  .  clonazePAM  0.5 mg Oral QHS  . loratadine  10 mg Oral Daily  . pantoprazole  40 mg Oral Daily  . [START ON 01/12/2013] prasugrel  10 mg Oral Daily  . sodium chloride  3 mL Intravenous Q12H  . sodium chloride  3 mL Intravenous Q12H   Continuous Infusions: . heparin 1,200 Units/hr (01/11/13 1016)    Principal Problem:   Atypical chest pain Active Problems:   Hyperlipidemia   Statin intolerance   GERD (gastroesophageal reflux disease)   Anxiety   SEMI (subendocardial myocardial infarction)    Time spent: 35    [facility]  C  [facility] Pager [phone]. If 7PM-7AM, please contact night-coverage at [email or web address], password [facility] 01/11/2013, 11:56 AM  LOS: 1 day

## 2013-01-11 NOTE — CV Procedure (Signed)
Cardiac Catheterization Operative Report  Andrew Sampson [id number] 12/13/20143:49 PM [name], MD  Procedure Performed:  1. Left Heart Catheterization 2. Selective Coronary Angiography 3. Left ventricular angiography  Operator: Verne Carrow, MD  Arterial access site:  Right femoral artery.    Indication: 52 yo male with personal history of hyperlipidemia admitted with chest pain. He has ruled in for MI with elevated troponin. Recurrent pain today. Given NTG and became hypotensive followed by syncopal episode. Urgent cardiac cath today given recurrent chest pain and rising troponin.                                Procedure Details: The risks, benefits, complications, treatment options, and expected outcomes were discussed with the patient. The patient and/or family concurred with the proposed plan, giving informed consent. The patient was brought to the cath lab after IV hydration was begun and oral premedication was given. The patient was further sedated with Versed and Fentanyl. The right groin was prepped and draped and a sterile fashion. 1% lidocaine was used for local anesthesia. Using the modified Seldinger access technique, a 6 French sheath was placed in the right femoral artery. Standard diagnostic catheters were used to perform selective coronary angiography. A pigtail catheter was used to perform a left ventricular angiogram.    There were no immediate complications. The patient was taken to the recovery area in stable condition.   Hemodynamic Findings: Central aortic pressure: 143/87 Left ventricular pressure: 133/6/12  Angiographic Findings:  Left main: Normal caliber vessel with mild luminal irregularity. This vessel bifurcates into the LAD and Circumflex.   Left Anterior Descending Artery: Large caliber vessel that courses to the apex. The proximal vessel has diffuse 50% stenosis with haziness just before the first diagonal branch. The mid vessel has  a 70% stenosis just beyond the first diagonal branch. The more distal segment of the mid vessel has a long 60% stenosis followed by a smooth 95% stenosis. The first diagonal branch is moderate in caliber and has a focal 99% stenosis in the proximal segment with TIMI-1 flow down the vessel.   Circumflex Artery: Large caliber vessel with two obtuse marginal branches and large left sided posterolateral branch. The moderate caliber first obtuse marginal branch has a focal 99% stenosis. The second obtuse marginal branch and the posterolateral branch are moderate in caliber with mild luminal irregularities.   Right Coronary Artery: Moderate caliber non-dominant vessel with 100% total occlusion in the mid vessel. The mid and distal vessel fills from bridging right to right collaterals.   Left Ventricular Angiogram: LvEF=45% with hypokinesis of the anterolateral wall.   Impression: 1. Severe triple vessel CAD 2. Mild segmental LV systolic dysfunction 3. NSTEMI. The culprit vessel is likely the moderate caliber diagonal branch. He has diffuse multi-vessel CAD so PCI in not the preferred method for revascularization.   Recommendations: He has multi-vessel CAD. Given the diffuse disease in the proximal to mid LAD, mid to distal LAD along with the high grade disease in other branch vessels, CABG is the recommended revascularization procedure. Will consult CT surgery for CABG. He has been loaded with Effient this am so CABG will be delayed to allow washout. If we pursued percutaneous revascularization, would require stenting of the Diagonal, proximal/mid LAD, mid to distal LAD, obtuse marginal. The RCA is a non-dominant vessel. Will start heparin drip 8 hours post sheath pull.  Complications:  None. The patient tolerated the procedure well.

## 2013-01-11 NOTE — Progress Notes (Signed)
ANTICOAGULATION CONSULT NOTE - Follow Up Consult  Pharmacy Consult for Heparin Indication: chest pain/ACS  No Known Allergies  Patient Measurements: Height: 5\' 11"  (180.3 cm) Weight: 166 lb 14.2 oz (75.7 kg) IBW/kg (Calculated) : 75.3 Heparin Dosing Weight:   Vital Signs: Temp: 98.7 F (37.1 C) (12/12 2125) Temp src: Oral (12/12 2125) BP: 142/90 mmHg (12/12 2125) Pulse Rate: 59 (12/12 2125)  Labs:  Recent Labs  01/10/13 1707 01/10/13 2108 01/11/13 0246  HGB 14.7  --   --   HCT 42.4  --   --   PLT 268  --   --   APTT  --  26  --   LABPROT  --  13.3  --   INR  --  1.03  --   HEPARINUNFRC  --   --  0.33  CREATININE 1.22  --   --   TROPONINI  --  1.62* 8.31*    Estimated Creatinine Clearance: 73.7 ml/min (by C-G formula based on Cr of 1.22).   Medications:  Infusions:  . heparin 900 Units/hr (01/10/13 2259)    Assessment: Patient with heparin level at goal but level drawn early.  No issues per RN.  Goal of Therapy:  Heparin level 0.3-0.7 units/ml Monitor platelets by anticoagulation protocol: Yes   Plan:  Continue heparin at current rate. Recheck level at 0800  Darlina Guys, Jacquenette Shone Crowford 01/11/2013,5:48 AM

## 2013-01-11 NOTE — H&P (View-Only) (Signed)
Patient ID: Andrew Sampson, male   DOB: 08/02/1960, 52 y.o.   MRN: [id number]    Subjective:  Denies SSCP, palpitations or Dyspnea Discussed positive troponins with him  Objective:  Filed Vitals:   01/10/13 1933 01/10/13 1937 01/10/13 2125 01/11/13 0726  BP: 126/82  142/90 110/61  Pulse: 59  59 72  Temp:  98 F (36.7 C) 98.7 F (37.1 C) 98.3 F (36.8 C)  TempSrc:   Oral Oral  Resp: 14  16 16  Height:   5' 11" (1.803 m)   Weight:   166 lb 14.2 oz (75.7 kg)   SpO2: 99%  100% 97%    Intake/Output from previous day:  Intake/Output Summary (Last 24 hours) at 01/11/13 0824 Last data filed at 01/11/13 0652  Gross per 24 hour  Intake  70.95 ml  Output    620 ml  Net -549.05 ml    Physical Exam: Affect appropriate Healthy:  appears stated age HEENT: normal Neck supple with no adenopathy JVP normal no bruits no thyromegaly Lungs clear with no wheezing and good diaphragmatic motion Heart:  S1/S2 no murmur, no rub, gallop or click PMI normal Abdomen: benighn, BS positve, no tenderness, no AAA no bruit.  No HSM or HJR Distal pulses intact with no bruits No edema Neuro non-focal Skin warm and dry No muscular weakness   Lab Results: Basic Metabolic Panel:  Recent Labs  01/10/13 1707  NA 132*  K 3.9  CL 95*  CO2 25  GLUCOSE 125*  BUN 17  CREATININE 1.22  CALCIUM 9.4   CBC:  Recent Labs  01/10/13 1707  WBC 7.3  HGB 14.7  HCT 42.4  MCV 88.0  PLT 268   Cardiac Enzymes:  Recent Labs  01/10/13 2108 01/11/13 0246  TROPONINI 1.62* 8.31*   Fasting Lipid Panel:  Recent Labs  01/11/13 0246  CHOL 273*  HDL 17*  LDLCALC 214*  TRIG 209*  CHOLHDL 16.1   Thyroid Function Tests:  Recent Labs  01/10/13 2108  TSH 0.729    Imaging: Dg Chest Port 1 View  01/10/2013   CLINICAL DATA:  Chest pain  EXAM: PORTABLE CHEST - 1 VIEW  COMPARISON:  12/23/2012  FINDINGS: The heart size and mediastinal contours are within normal limits. Both lungs are clear. The  visualized skeletal structures are unremarkable.  IMPRESSION: No active disease.   Electronically Signed   By: Mark  Lukens M.D.   On: 01/10/2013 17:31    Cardiac Studies:  ECG:  SR normal    Telemetry:  NSR 65-72  Echo: pending  Medications:   . acyclovir  400 mg Oral QHS  . aspirin EC  325 mg Oral Daily  . carvedilol  3.125 mg Oral BID WC  . clonazePAM  0.5 mg Oral QHS  . loratadine  10 mg Oral Daily  . pantoprazole  40 mg Oral Daily  . prasugrel  10 mg Oral Daily  . prasugrel  60 mg Oral Once  . sodium chloride  3 mL Intravenous Q12H  . sodium chloride  3 mL Intravenous Q12H     . heparin 900 Units/hr (01/10/13 2259)    Assessment/Plan:  Chest pain:  Troponin over 8 Transfer to [facility] Continue heparin.  Start beta blocker and effient cath on Monday GERD:  Continue pantoprazole  Discussed cath with patient He is anxious orders written and placed [facility]  Janese Radabaugh 01/11/2013, 8:24 AM     

## 2013-01-11 NOTE — Progress Notes (Signed)
ANTICOAGULATION CONSULT NOTE - Follow Up Consult  Pharmacy Consult for heparin Indication: chest pain/ACS  No Known Allergies  Patient Measurements: Height: 5\' 11"  (180.3 cm) Weight: 166 lb 14.2 oz (75.7 kg) IBW/kg (Calculated) : 75.3 Heparin Dosing Weight: 75kg  Vital Signs: Temp: 97.4 F (36.3 C) (12/13 1718) Temp src: Oral (12/13 1718) BP: 135/87 mmHg (12/13 1718) Pulse Rate: 74 (12/13 1718)  Labs:  Recent Labs  01/10/13 1707 01/10/13 2108 01/11/13 0246 01/11/13 0843  HGB 14.7  --   --   --   HCT 42.4  --   --   --   PLT 268  --   --   --   APTT  --  26  --   --   LABPROT  --  13.3  --   --   INR  --  1.03  --   --   HEPARINUNFRC  --   --  0.33 0.13*  CREATININE 1.22  --   --   --   TROPONINI  --  1.62* 8.31* 14.58*    Estimated Creatinine Clearance: 73.7 ml/min (by C-G formula based on Cr of 1.22).   Assessment: 71 YOM s/p cath to continue heparin 8 hours post sheath removal for CABG plans. Sheath removed ~1645. Heparin level was low this morning at [facility] on 900units/hr- rate was increased to 1200units/hr but no level has been checked since. hgb 14.7, plts 268 last evening. No excessive bleeding noted from cath.  Goal of Therapy:  Heparin level 0.3-0.7 units/ml Monitor platelets by anticoagulation protocol: Yes   Plan:  1. Restart heparin 1200units/hr at 0100 2. Check 6 hour heparin level after resumption 3. Daily heparin level and CBC 4. Follow for s/s bleeding  Khalise Billard D. Koehn Salehi, PharmD, BCPS Clinical Pharmacist Pager: [phone] 01/11/2013 5:27 PM

## 2013-01-11 NOTE — Progress Notes (Signed)
Patient ID: Andrew Sampson, male   DOB: Oct 06, 1960, 52 y.o.   MRN: [id number] Patient arrived at [facility] On transport had SSCP and dropped his pressure with syncope Hydrated  Discussed with Dr Clifton James  Given inability to Rx pain and high troponin  Will proceed with cath urgently F/U ECG with no ST elevation  [name]

## 2013-01-11 NOTE — Consult Note (Signed)
[address]       [place] [address]             [phone]        GREER KOEPPEN [facility] Medical Record #[id number] Date of Birth: 11/11/1960  Referring: Dr Estrella Myrtle Primary Care: [name], MD  Chief Complaint:    Chief Complaint  Patient presents with  . Chest Pain  . Shortness of Breath    History of Present Illness:     Patient is a 52 year old male with no previous history of coronary artery disease who presents with three-week history of increasing episodes of substernal and neck burning, made worse by eating. On the day of admission he was riding his mountain bike on our [employer] [place]. He had increasing symptoms of chest discomfort, making in, near syncope and numbness in his left arm. He drove himself to [facility] long hospital was admitted, serial troponins demonstrated evidence of myocardial infarction and patient was transferred to [place]. Cardiac catheterization was performed the following day, and demonstrated 3 vessel coronary artery disease, not amenable to angioplasty. Patient was loaded with Effient.   Current Activity/ Functional Status: Patient is independent with mobility/ambulation, transfers, ADL's, IADL's.   Zubrod Score: At the time of surgery this patient's most appropriate activity status/level should be described as: []  Normal activity, no symptoms [x]  Symptoms, fully ambulatory []  Symptoms, in bed less than or equal to 50% of the time []  Symptoms, in bed greater than 50% of the time but less than 100% []  Bedridden []  Moribund  Past Medical History  Diagnosis Date  . Heart burn   . Hyperlipidemia     statin intolerant    Past Surgical History  Procedure Laterality Date  . Cervical spine surgery  approx 2011    C3,C4, C5 diskectomy  . Tonsillectomy      History  Smoking status  . Never Smoker   Smokeless tobacco  . Never Used    History  Alcohol Use  . Yes    Comment: occasional    History    Social History  . Marital Status: Married    Spouse Name: N/A    Number of Children: N/A  . Years of Education: Legal degree   Occupational History  . lawer- self employed   Social History Main Topics  . Smoking status: Never Smoker   . Smokeless tobacco: Never Used  . Alcohol Use: Yes     Comment: occasional  . Drug Use: No  .      No Known Allergies  Current Facility-Administered Medications  Medication Dose Route Frequency Provider Last Rate Last Dose  . 0.9 %  sodium chloride infusion  250 mL Intravenous PRN Houston Siren, MD      . 0.9 %  sodium chloride infusion   Intravenous Continuous [name], MD 75 mL/hr at 01/11/13 1715    . acetaminophen (TYLENOL) tablet 650 mg  650 mg Oral Q4H PRN [name], MD      . acyclovir (ZOVIRAX) 200 MG capsule 400 mg  400 mg Oral QHS Houston Siren, MD   400 mg at 01/11/13 0235  . aspirin EC tablet 325 mg  325 mg Oral Daily Houston Siren, MD   325 mg at 01/11/13 0932  . carvedilol (COREG) tablet 3.125 mg  3.125 mg Oral BID WC [name], MD   3.125 mg at 01/11/13 0932  . clonazePAM (KLONOPIN) tablet 0.5 mg  0.5 mg Oral QHS Houston Siren, MD   0.5 mg at 01/11/13 0235  . [START ON 01/12/2013] heparin ADULT infusion 100 units/mL (25000 units/250 mL)  1,200 Units/hr Intravenous Continuous Lauren Bajbus, RPH      . loratadine (CLARITIN) tablet 10 mg  10 mg Oral Daily Houston Siren, MD   10 mg at 01/11/13 0932  . morphine 2 MG/ML injection 2 mg  2 mg Intravenous Q1H PRN Houston Siren, MD      . ondansetron [facility]) tablet 4 mg  4 mg Oral Q6H PRN Houston Siren, MD       Or  . ondansetron [facility]) injection 4 mg  4 mg Intravenous Q6H PRN Houston Siren, MD      . pantoprazole (PROTONIX) EC tablet 40 mg  40 mg Oral Daily Houston Siren, MD   40 mg at 01/11/13 0932  . sodium chloride 0.9 % injection 3 mL  3 mL Intravenous Q12H Houston Siren, MD      . sodium chloride 0.9 % injection 3 mL  3 mL Intravenous Q12H Houston Siren, MD      . sodium chloride 0.9 % injection 3 mL   3 mL Intravenous PRN Houston Siren, MD        Prescriptions prior to admission  Medication Sig Dispense Refill  . acyclovir (ZOVIRAX) 200 MG capsule Take 400 mg by mouth at bedtime.      Marland Kitchen azithromycin (ZITHROMAX) 250 MG tablet Take 250 mg by mouth daily. z-pack.      . clonazePAM (KLONOPIN) 0.5 MG tablet Take 0.5 mg by mouth at bedtime.      Marland Kitchen esomeprazole (NEXIUM) 40 MG capsule Take 40 mg by mouth daily at 12 noon.      . loratadine (CLARITIN) 10 MG tablet Take 10 mg by mouth daily.      Marland Kitchen omega-3 acid ethyl esters (LOVAZA) 1 G capsule Take 2 g by mouth daily.        Family History  Problem Relation Age of Onset  . Heart attack Father   . Heart disease Father   . Hypertension Father   . Hyperlipidemia Father      Review of Systems:     Cardiac Review of Systems: Y or N  Chest Pain [  y  ]  Resting SOB [ n  ] Exertional SOB  [ y ]  Pollyann Kennedy Milo.Brash  ]   Pedal Edema [ n  ]    Palpitations [n  ] Syncope  [ n ]   Presyncope [ y  ]  General Review of Systems: [Y] = yes [  ]=no Constitional: recent weight change [  ]n; anorexia [n  ]; fatigue [ y ]; nausea [  ]; night sweats [  n]; fever [ n ]; or chills [  n]                                                               Dental: poor dentition[ n ]; Last Dentist visit:   Eye : blurred vision [  ]; diplopia [   ]; vision changes [  ];  Amaurosis fugax[n  ]; Resp: cough [ n ];  wheezing[ n ];  hemoptysis[ n ]; shortness of breath[  ]; paroxysmal nocturnal dyspnea[  ];  dyspnea on exertion[ y ]; or orthopnea[  ];  GI:  gallstones[  ], vomiting[  ];  dysphagia[  ]; melena[  ];  hematochezia [  ]; heartburn[  ];   Hx of  Colonoscopy[ n ]; GU: kidney stones [  ]; hematuria[ n ];   dysuria [ n ];  nocturia[ n ];  history of     obstruction [n  ]; urinary frequency [ n ]             Skin: rash, swelling[  ];, hair loss[  ];  peripheral edema[  ];  or itching[  ]; Musculosketetal: myalgias[  ];  joint swelling[  ];  joint erythema[  ];  joint pain[  ];   back pain[  ];  Heme/Lymph: bruising[  ];  bleeding[  ];  anemia[  ];  Neuro: TIA[  n];  headaches[  ];  stroke[ n ];  vertigo[  ];  seizures[n  ];   paresthesias[  ];  difficulty walking[  ];  Psych:depression[  ]; anxiety[  ];  Endocrine: diabetes[ n ];  thyroid dysfunction[  ];  Immunizations: Flu [  ]; Pneumococcal[  ];  Other:  Physical Exam: BP 114/76  Pulse 72  Temp(Src) 97.4 F (36.3 C) (Oral)  Resp 10  Ht 5\' 11"  (1.803 m)  Wt 166 lb 14.2 oz (75.7 kg)  BMI 23.29 kg/m2  SpO2 99%  General appearance: alert, cooperative, appears stated age and no distress Neurologic: intact Heart: regular rate and rhythm, S1, S2 normal, no murmur, click, rub or gallop Lungs: clear to auscultation bilaterally Abdomen: soft, non-tender; bowel sounds normal; no masses,  no organomegaly Extremities: extremities normal, atraumatic, no cyanosis or edema and Homans sign is negative, no sign of DVT Wound: attempted left radial cath site , rt gron cath site with dressing in place Vascular: Patient has no carotid bruits, has palpable DP and PT pulses bilaterally Patient is right-handed  Diagnostic Studies & Laboratory data:     Recent Radiology Findings:   Dg Chest Port 1 View  01/10/2013   CLINICAL DATA:  Chest pain  EXAM: PORTABLE CHEST - 1 VIEW  COMPARISON:  12/23/2012  FINDINGS: The heart size and mediastinal contours are within normal limits. Both lungs are clear. The visualized skeletal structures are unremarkable.  IMPRESSION: No active disease.   Electronically Signed   By: Alcide Clever M.D.   On: 01/10/2013 17:31      Recent Lab Findings:  Lab Results  Component Value Date   TROPONINI 14.58* 01/11/2013    Lab Results  Component Value Date   WBC 7.3 01/10/2013   HGB 14.7 01/10/2013   HCT 42.4 01/10/2013   PLT 268 01/10/2013   GLUCOSE 125* 01/10/2013   CHOL 273* 01/11/2013   TRIG 209* 01/11/2013   HDL 17* 01/11/2013   LDLCALC 214* 01/11/2013   ALT 71* 10/05/2009   AST 32  10/05/2009   NA 132* 01/10/2013   K 3.9 01/10/2013   CL 95* 01/10/2013   CREATININE 1.22 01/10/2013   BUN 17 01/10/2013   CO2 25 01/10/2013   TSH 0.729 01/10/2013   INR 1.03 01/10/2013   CATH: Procedure Performed:  1. Left Heart Catheterization 2. Selective Coronary Angiography 3. Left ventricular angiography Operator: Verne Carrow, MD  Arterial access site: Right femoral artery.  Indication: 52 yo male with personal history of hyperlipidemia admitted with chest pain. He has ruled in for MI with elevated troponin. Recurrent pain today. Given NTG and  became hypotensive followed by syncopal episode. Urgent cardiac cath today given recurrent chest pain and rising troponin.  Procedure Details:  The risks, benefits, complications, treatment options, and expected outcomes were discussed with the patient. The patient and/or family concurred with the proposed plan, giving informed consent. The patient was brought to the cath lab after IV hydration was begun and oral premedication was given. The patient was further sedated with Versed and Fentanyl. The right groin was prepped and draped and a sterile fashion. 1% lidocaine was used for local anesthesia. Using the modified Seldinger access technique, a 6 French sheath was placed in the right femoral artery. Standard diagnostic catheters were used to perform selective coronary angiography. A pigtail catheter was used to perform a left ventricular angiogram.  There were no immediate complications. The patient was taken to the recovery area in stable condition.  Hemodynamic Findings:  Central aortic pressure: 143/87  Left ventricular pressure: 133/6/12  Angiographic Findings:  Left main: Normal caliber vessel with mild luminal irregularity. This vessel bifurcates into the LAD and Circumflex.  Left Anterior Descending Artery: Large caliber vessel that courses to the apex. The proximal vessel has diffuse 50% stenosis with haziness just before the  first diagonal branch. The mid vessel has a 70% stenosis just beyond the first diagonal branch. The more distal segment of the mid vessel has a long 60% stenosis followed by a smooth 95% stenosis. The first diagonal branch is moderate in caliber and has a focal 99% stenosis in the proximal segment with TIMI-1 flow down the vessel.  Circumflex Artery: Large caliber vessel with two obtuse marginal branches and large left sided posterolateral branch. The moderate caliber first obtuse marginal branch has a focal 99% stenosis. The second obtuse marginal branch and the posterolateral branch are moderate in caliber with mild luminal irregularities.  Right Coronary Artery: Moderate caliber non-dominant vessel with 100% total occlusion in the mid vessel. The mid and distal vessel fills from bridging right to right collaterals.  Left Ventricular Angiogram: LvEF=45% with hypokinesis of the anterolateral wall.  Impression:  1. Severe triple vessel CAD  2. Mild segmental LV systolic dysfunction  3. NSTEMI. The culprit vessel is likely the moderate caliber diagonal branch. He has diffuse multi-vessel CAD so PCI in not the preferred method for revascularization.  Recommendations: He has multi-vessel CAD. Given the diffuse disease in the proximal to mid LAD, mid to distal LAD along with the high grade disease in other branch vessels, CABG is the recommended revascularization procedure. Will consult CT surgery for CABG. He has been loaded with Effient this am so CABG will be delayed to allow washout. If we pursued percutaneous revascularization, would require stenting of the Diagonal, proximal/mid LAD, mid to distal LAD, obtuse marginal. The RCA is a non-dominant vessel. Will start heparin drip 8 hours post sheath pull.  Complications: None. The patient tolerated the procedure well.      ECHO: Transthoracic Echocardiography  Patient: Andrew Sampson, Beg MR #: [id number] Study Date: 01/11/2013 Gender: M Age: 36 Height:  180.3cm Weight: 75.3kg BSA: 1.54m^2 Pt. Status: Room: 62  ORDERING Armanda Magic, MD REFERRING Armanda Magic, MD PERFORMING [facility] ATTENDING [place], Adeline ADMITTING Leia Alf, Sherilyn Cooter SONOGRAPHER Nestor Ramp cc:  ------------------------------------------------------------ LV EF: 50% - 55%  ------------------------------------------------------------ History: PMH: Chest pain. Risk factors: Former tobacco use. Hypercholesterolemia.  ------------------------------------------------------------ Study Conclusions  - Left ventricle: Possible inferolateral wall hypokinesis but image quality is poor Wall thickness was increased in a pattern of mild LVH. Systolic function was  normal. The estimated ejection fraction was in the range of 50% to 55%. - Aortic valve: Mild regurgitation. - Atrial septum: No defect or patent foramen ovale was identified. Transthoracic echocardiography. M-mode, complete 2D, spectral Doppler, and color Doppler. Height: Height: 180.3cm. Height: 71in. Weight: Weight: 75.3kg. Weight: 165.7lb. Body mass index: BMI: 23.2kg/m^2. Body surface area: BSA: 1.42m^2. Blood pressure: 110/61. Patient status: Inpatient. Location: Bedside.  ------------------------------------------------------------  ------------------------------------------------------------ Left ventricle: Possible inferolateral wall hypokinesis but image quality is poor Wall thickness was increased in a pattern of mild LVH. Systolic function was normal. The estimated ejection fraction was in the range of 50% to 55%.  ------------------------------------------------------------ Aortic valve: Mildly thickened, mildly calcified leaflets. Doppler: Mild regurgitation.  ------------------------------------------------------------ Aorta: Aortic root: The aortic root was normal in size. Ascending aorta: The ascending aorta was normal in  size.  ------------------------------------------------------------ Mitral valve: Mildly thickened leaflets . Doppler: No significant regurgitation.  ------------------------------------------------------------ Left atrium: The atrium was at the upper limits of normal in size.  ------------------------------------------------------------ Atrial septum: No defect or patent foramen ovale was identified.  ------------------------------------------------------------ Right ventricle: The cavity size was normal. Wall thickness was normal. Systolic function was normal.  ------------------------------------------------------------ Pulmonic valve: Structurally normal valve. Cusp separation was normal. Doppler: Transvalvular velocity was within the normal range. Trivial regurgitation.  ------------------------------------------------------------ Tricuspid valve: Structurally normal valve. Leaflet separation was normal. Doppler: Transvalvular velocity was within the normal range. Trivial regurgitation.  ------------------------------------------------------------ Right atrium: The atrium was normal in size.  ------------------------------------------------------------ Pericardium: The pericardium was normal in appearance.  ------------------------------------------------------------  2D measurements Normal Doppler Normal Left ventricle measurements LVID ED, 45.6 mm 43-52 Left ventricle chord, Ea, lat 8.64 cm/ ------- PLAX ann, tiss s LVID ES, 32.7 mm 23-38 DP chord, E/Ea, lat 6.13 ------- PLAX ann, tiss FS, chord, 28 % >29 DP PLAX Ea, med 5.17 cm/ ------- LVPW, ED 10.9 mm ------ ann, tiss s IVS/LVPW 1.07 <1.3 DP ratio, ED E/Ea, med 10.25 ------- Ventricular septum ann, tiss IVS, ED 11.7 mm ------ DP Aorta Mitral valve Root diam, 34 mm ------ Peak E vel 53 cm/ ------- ED s Left atrium Peak A vel 68.1 cm/ ------- AP dim 36 mm ------ s AP dim 1.85 cm/m^2 <2.2 Deceleratio 222  ms 150-230 index n time Vol, S 51 ml ------ Peak E/A 0.8 ------- Vol index, 26.2 ml/m^2 ------ ratio S Systemic veins Estimated 5 mm ------- CVP Hg  ------------------------------------------------------------ Prepared and Electronically Authenticated by  [name] 2014-12-13T12:59:34.053          Assessment / Plan:   #1 non-STEMI acute myocardial infarction with three-vessel coronary artery disease as described above preserved LV function #2 hyperlipidemia with marked elevation of LDH  #3 has been loaded with Effient- P2 Y 12 testing pending  I reviewed the patient's history films and discussed with he and his wife the option of coronary artery bypass grafting as the best treatment for relief of his symptoms and preservation of LV function. Risks and options are discussed in detail, including the potential risk of bleeding. Patient is currently stable, likely will need time for Effient washout. I've ordered P2Y12 testing. I recommended to him that we proceed with coronary artery bypass grafting on this admission.  I spent 60 minutes counseling the patient face to face. The total time spent in the consult was 80 minutes.    [name] MD      [address] [place] [place] [address] Office [phone]   Beeper [phone]  01/11/2013 7:11 PM

## 2013-01-11 NOTE — Progress Notes (Signed)
ANTICOAGULATION CONSULT NOTE - Follow Up Consult  Pharmacy Consult for Heparin Indication: chest pain/ACS  No Known Allergies  Patient Measurements: Height: 5\' 11"  (180.3 cm) Weight: 166 lb 14.2 oz (75.7 kg) IBW/kg (Calculated) : 75.3 Heparin Dosing Weight: 75.7 kg  Vital Signs: Temp: 98.3 F (36.8 C) (12/13 0726) Temp src: Oral (12/13 0726) BP: 110/61 mmHg (12/13 0726) Pulse Rate: 72 (12/13 0726)  Labs:  Recent Labs  01/10/13 1707 01/10/13 2108 01/11/13 0246 01/11/13 0843  HGB 14.7  --   --   --   HCT 42.4  --   --   --   PLT 268  --   --   --   APTT  --  26  --   --   LABPROT  --  13.3  --   --   INR  --  1.03  --   --   HEPARINUNFRC  --   --  0.33 0.13*  CREATININE 1.22  --   --   --   TROPONINI  --  1.62* 8.31*  --     Estimated Creatinine Clearance: 73.7 ml/min (by C-G formula based on Cr of 1.22).   Medications:  Scheduled:  . acyclovir  400 mg Oral QHS  . aspirin EC  325 mg Oral Daily  . carvedilol  3.125 mg Oral BID WC  . clonazePAM  0.5 mg Oral QHS  . loratadine  10 mg Oral Daily  . pantoprazole  40 mg Oral Daily  . [START ON 01/12/2013] prasugrel  10 mg Oral Daily  . prasugrel  60 mg Oral Once  . sodium chloride  3 mL Intravenous Q12H  . sodium chloride  3 mL Intravenous Q12H   Infusions:  . heparin 900 Units/hr (01/10/13 2259)    Assessment: 52 yo M admitted 12/12 with chest pain.  Pharmacy was consulted to dose IV heparin for r/o ACS.  Troponin: 1.62, 8.31  Lipids elevated (chol 273, LDL 214.)  Pt with known hx of hyperlipidemia with statin intolerance (reaction not documented) Concurrent medications:  Aspirin, Prasugrel (watch for increased risk of bleeding)  Baseline coags wnl  CBC:  Baseline wnl with Hgb 14.7 and Plt 268 (12/12)  Heparin level = 0.13 is below goal range after first heparin level was therapeutic.  RN confirms heparin infusion at 9 ml/hr and no interruptions, complications, or bleeding.   Goal of Therapy:   Heparin level 0.3-0.7 units/ml Monitor platelets by anticoagulation protocol: Yes   Plan:   Heparin 2300 units bolus IV x 1  Increase to heparin IV infusion at 1200 units/hr (12 ml/hr)  Heparin level 6 hours after rate change.  Daily heparin level and CBC  Continue to monitor H&H and platelets  Lynann Beaver PharmD, BCPS Pager [phone] 01/11/2013 9:29 AM

## 2013-01-11 NOTE — Progress Notes (Signed)
  Echocardiogram 2D Echocardiogram has been performed.  Andrew Sampson M 01/11/2013, 10:28 AM

## 2013-01-11 NOTE — Progress Notes (Signed)
Admission from [place] brought  In by [facility] awake and alert. Still  With slight chestpain,  MD aware, continue to monitor.

## 2013-01-12 ENCOUNTER — Encounter (HOSPITAL_COMMUNITY): Payer: Self-pay | Admitting: Cardiology

## 2013-01-12 DIAGNOSIS — Z01810 Encounter for preprocedural cardiovascular examination: Secondary | ICD-10-CM

## 2013-01-12 DIAGNOSIS — E7849 Other hyperlipidemia: Secondary | ICD-10-CM | POA: Diagnosis present

## 2013-01-12 DIAGNOSIS — E785 Hyperlipidemia, unspecified: Secondary | ICD-10-CM

## 2013-01-12 LAB — BASIC METABOLIC PANEL
CO2: 26 mEq/L (ref 19–32)
Calcium: 8.9 mg/dL (ref 8.4–10.5)
Chloride: 105 mEq/L (ref 96–112)
Creatinine, Ser: 1.2 mg/dL (ref 0.50–1.35)
Glucose, Bld: 103 mg/dL — ABNORMAL HIGH (ref 70–99)
Sodium: 141 mEq/L (ref 135–145)

## 2013-01-12 LAB — PLATELET INHIBITION P2Y12: Platelet Function  P2Y12: 10 [PRU] — ABNORMAL LOW (ref 194–418)

## 2013-01-12 LAB — CBC
Hemoglobin: 13.8 g/dL (ref 13.0–17.0)
MCH: 30.5 pg (ref 26.0–34.0)
MCV: 89.6 fL (ref 78.0–100.0)
RBC: 4.53 MIL/uL (ref 4.22–5.81)
WBC: 7.9 10*3/uL (ref 4.0–10.5)

## 2013-01-12 LAB — HEPARIN LEVEL (UNFRACTIONATED): Heparin Unfractionated: 0.27 IU/mL — ABNORMAL LOW (ref 0.30–0.70)

## 2013-01-12 MED ORDER — ATORVASTATIN CALCIUM 40 MG PO TABS
40.0000 mg | ORAL_TABLET | Freq: Every day | ORAL | Status: DC
  Administered 2013-01-12 – 2013-01-14 (×3): 40 mg via ORAL
  Filled 2013-01-12 (×4): qty 1

## 2013-01-12 MED ORDER — HEPARIN (PORCINE) IN NACL 100-0.45 UNIT/ML-% IJ SOLN
1500.0000 [IU]/h | INTRAMUSCULAR | Status: DC
  Administered 2013-01-12: 1350 [IU]/h via INTRAVENOUS
  Administered 2013-01-13 – 2013-01-15 (×4): 1500 [IU]/h via INTRAVENOUS
  Filled 2013-01-12 (×5): qty 250

## 2013-01-12 NOTE — Progress Notes (Signed)
ANTICOAGULATION CONSULT NOTE - Follow Up Consult  Pharmacy Consult for Heparin Indication: chest pain/ACS  No Known Allergies  Patient Measurements: Height: 5\' 11"  (180.3 cm) Weight: 166 lb 14.2 oz (75.7 kg) IBW/kg (Calculated) : 75.3 Heparin Dosing Weight: 76kg  Vital Signs: Temp: 98 F (36.7 C) (12/14 1208) Temp src: Oral (12/14 1208) BP: 127/80 mmHg (12/14 1208) Pulse Rate: 93 (12/14 1208)  Labs:  Recent Labs  01/10/13 1707 01/10/13 2108  01/11/13 0246 01/11/13 0843 01/12/13 0508 01/12/13 0705  HGB 14.7  --   --   --   --   --  13.8  HCT 42.4  --   --   --   --   --  40.6  PLT 268  --   --   --   --   --  248  APTT  --  26  --   --   --   --   --   LABPROT  --  13.3  --   --   --   --   --   INR  --  1.03  --   --   --   --   --   HEPARINUNFRC  --   --   < > 0.33 0.13* 0.45 0.27*  CREATININE 1.22  --   --   --   --   --  1.20  TROPONINI  --  1.62*  --  8.31* 14.58*  --   --   < > = values in this interval not displayed.  Estimated Creatinine Clearance: 75 ml/min (by C-G formula based on Cr of 1.2).   Medications:  Heparin @ 1350 units/hr  Assessment: 52yom continues on IV heparin for severe 3v CAD awaiting CABG next week. Heparin level is now therapeutic after rate increase this morning.   Goal of Therapy:  Heparin level 0.3-0.7 units/ml Monitor platelets by anticoagulation protocol: Yes   Plan:  1) Continue heparin at 1350 units/hr 2) Follow up heparin level and CBC in AM  Fredrik Rigger 01/12/2013,4:04 PM

## 2013-01-12 NOTE — Progress Notes (Signed)
[facility] [place] TEAM 1 - Stepdown/ICU TEAM  Chart reviewed in detail.  Discussed case w/ Dr. Anne Fu.  In that all active issues at the moment are cardiac in nature, Dr. Anne Fu has agreed for the pt to be transitioned to the Cardiology service while he is awaiting his CABG.    [facility] will sign off.  Please call if we can be of assistance in the care of this gentleman in any way.  Lonia Blood, MD [facility] Office  [phone] Pager [phone]  On-Call/Text Page:      [email or web address]      password [facility]

## 2013-01-12 NOTE — Progress Notes (Addendum)
VASCULAR LAB PRELIMINARY  PRELIMINARY  PRELIMINARY  PRELIMINARY  Pre-op Cardiac Surgery  Carotid Findings: Right 40% to 59% ICA stenosis in the bulb lower end of range. Left :  1-39% ICA stenosis. Bilateral -  Vertebral artery flow is antegrade.     Upper Extremity Right Left  Brachial Pressures 119 Triphasic 121 Triphasic  Radial Waveforms Triphasic Triphasic  Ulnar Waveforms Triphasic Triphasic  Palmar Arch (Allen's Test) Normal Normal   Findings:  Doppler waveforms remained normal with both radial and ulnar compressions bilaterally    Lower  Extremity Right Left  Dorsalis Pedis    Anterior Tibial    Posterior Tibial    Ankle/Brachial Indices      Findings:  Pedal pulses are palpable   Andrew Sampson, RVS 01/12/2013, 12:06 PM

## 2013-01-12 NOTE — Progress Notes (Signed)
    Subjective:  No pain currently, no SOB. Cath site normal.   Objective:  Vital Signs in the last 24 hours: Temp:  [97.4 F (36.3 C)-98.3 F (36.8 C)] 98.2 F (36.8 C) (12/14 0510) Pulse Rate:  [59-80] 59 (12/13 2350) Resp:  [8-20] 20 (12/13 2100) BP: (105-135)/(64-87) 106/64 mmHg (12/14 0510) SpO2:  [94 %-99 %] 99 % (12/13 2100)  Intake/Output from previous day: 12/13 0701 - 12/14 0700 In: 886.6 [I.V.:886.6] Out: -    Physical Exam: General: Well developed, well nourished, in no acute distress. Head:  Normocephalic and atraumatic. Lungs: Clear to auscultation and percussion. Heart: Normal S1 and S2.  No murmur, rubs or gallops.  Abdomen: soft, non-tender, positive bowel sounds. Extremities: No clubbing or cyanosis. No edema. Cath site c/d/i, no hematoma.  Neurologic: Alert and oriented x 3. Mildly anxious.    Lab Results:  Recent Labs  01/10/13 1707 01/12/13 0705  WBC 7.3 7.9  HGB 14.7 13.8  PLT 268 248    Recent Labs  01/10/13 1707 01/12/13 0705  NA 132* 141  K 3.9 3.9  CL 95* 105  CO2 25 26  GLUCOSE 125* 103*  BUN 17 16  CREATININE 1.22 1.20    Recent Labs  01/11/13 0246 01/11/13 0843  TROPONINI 8.31* 14.58*   Hepatic Function Panel No results found for this basename: PROT, ALBUMIN, AST, ALT, ALKPHOS, BILITOT, BILIDIR, IBILI,  in the last 72 hours  Recent Labs  01/11/13 0246  CHOL 273*   No results found for this basename: PROTIME,  in the last 72 hours  Imaging: Dg Chest Port 1 View  01/10/2013   CLINICAL DATA:  Chest pain  EXAM: PORTABLE CHEST - 1 VIEW  COMPARISON:  12/23/2012  FINDINGS: The heart size and mediastinal contours are within normal limits. Both lungs are clear. The visualized skeletal structures are unremarkable.  IMPRESSION: No active disease.   Electronically Signed   By: Alcide Clever M.D.   On: 01/10/2013 17:31   Personally viewed.   Telemetry: 7 beats of WCT.  Personally viewed.   EKG:  12/12 - ectopic atrial  rhythm, NSSTW changes.   Cardiac Studies:   CATH - 3v CAD (reviewed Dr. Erlinda Hong note),   ECHO -  - Left ventricle: Possible inferolateral wall hypokinesis but image quality is poor Wall thickness was increased in a pattern of mild LVH. Systolic function was normal. The estimated ejection fraction was in the range of 50% to 55%. - Aortic valve: Mild regurgitation. - Atrial septum: No defect or patent foramen ovale was identified.   Assessment/Plan:  Principal Problem:   Atypical chest pain Active Problems:   Hyperlipidemia   Statin intolerance   GERD (gastroesophageal reflux disease)   Anxiety   SEMI (subendocardial myocardial infarction)  1) NSTEMI - severe 3 v CAD. Dr. [name] discussed last evening with Andrew Sampson. Effient 60mg  given 01/11/13. Planned CABG when PLT's allow. Heparin IV. Troponin 14. Platelet function 10 at 0705 12/14.   2) CAD - beta blocker. BP does not allow up titration at this point. Coreg 3.125 BID. ASA. EF normal.   3) Familial Hyperipidemia - Prior statin intolerance. Tried several. Leg cramps. Avid mountain biker. Will retry. I will give atorvastatin 40mg  PO QD. LDL 214.   Await CABG.   [name], Andrew Sampson 01/12/2013, 8:18 AM

## 2013-01-12 NOTE — Progress Notes (Signed)
ANTICOAGULATION CONSULT NOTE - Follow Up Consult  Pharmacy Consult:  Heparin Indication: chest pain/ACS  No Known Allergies  Patient Measurements: Height: 5\' 11"  (180.3 cm) Weight: 166 lb 14.2 oz (75.7 kg) IBW/kg (Calculated) : 75.3 Heparin Dosing Weight: 76 kg  Vital Signs: Temp: 98.2 F (36.8 C) (12/14 0510) Temp src: Oral (12/14 0510) BP: 106/64 mmHg (12/14 0510) Pulse Rate: 59 (12/13 2350)  Labs:  Recent Labs  01/10/13 1707 01/10/13 2108 01/11/13 0246 01/11/13 0843 01/12/13 0705  HGB 14.7  --   --   --  13.8  HCT 42.4  --   --   --  40.6  PLT 268  --   --   --  248  APTT  --  26  --   --   --   LABPROT  --  13.3  --   --   --   INR  --  1.03  --   --   --   HEPARINUNFRC  --   --  0.33 0.13* 0.27*  CREATININE 1.22  --   --   --   --   TROPONINI  --  1.62* 8.31* 14.58*  --     Estimated Creatinine Clearance: 73.7 ml/min (by C-G formula based on Cr of 1.22).      Assessment: 48 YOM admitted to [facility] with complaint of chest pain.  Patient transferred to [facility] on 01/11/13 for cath.  He is not s/p cath and is awaiting CABG.  Patient continues on IV heparin and heparin level is slightly sub-therapeutic.  No issues with infusion and no bleeding/hematoma per RN.   Goal of Therapy:  Heparin level 0.3-0.7 units/ml Monitor platelets by anticoagulation protocol: Yes    Plan:  - Increase heparin gtt to 1350 units/hr - Check 6 hr HL - Daily HL / CBC    Taegen Delker D. Laney Potash, PharmD, BCPS Pager:  [phone] 01/12/2013, 8:03 AM

## 2013-01-12 NOTE — Progress Notes (Signed)
[address]       [place] [address]             [phone]                 1 Day Post-Op Procedure(s) (LRB): LEFT HEART CATH (N/A)  LOS: 2 days   Subjective: No chest pain, in chair reading   Objective: Vital signs in last 24 hours: Patient Vitals for the past 24 hrs:  BP Temp Temp src Pulse Resp SpO2  01/12/13 1208 127/80 mmHg 98 F (36.7 C) Oral 93 18 98 %  01/12/13 0800 120/75 mmHg 98.5 F (36.9 C) Oral 65 14 98 %  01/12/13 0510 106/64 mmHg 98.2 F (36.8 C) Oral - - -  01/11/13 2350 118/64 mmHg 98.3 F (36.8 C) Oral 59 - -  01/11/13 2100 129/80 mmHg 98.2 F (36.8 C) Axillary 78 20 99 %  01/11/13 2000 127/81 mmHg 98.2 F (36.8 C) Axillary 69 11 98 %  01/11/13 1900 114/76 mmHg - - 72 10 99 %  01/11/13 1830 120/75 mmHg - - 66 15 99 %  01/11/13 1800 116/83 mmHg - - 68 8 98 %  01/11/13 1745 113/77 mmHg - - 65 13 98 %  01/11/13 1730 125/79 mmHg - - 71 13 98 %  01/11/13 1718 135/87 mmHg 97.4 F (36.3 C) Oral 74 14 96 %  01/11/13 1715 135/87 mmHg - - 66 14 94 %  01/11/13 1547 - - - 79 - -  01/11/13 1515 109/78 mmHg - - 62 12 98 %  01/11/13 1500 116/83 mmHg - - 63 16 98 %  01/11/13 1457 105/67 mmHg 97.6 F (36.4 C) Oral 67 20 97 %    Filed Weights   01/10/13 2125  Weight: 166 lb 14.2 oz (75.7 kg)    Hemodynamic parameters for last 24 hours:    Intake/Output from previous day: 12/13 0701 - 12/14 0700 In: 886.6 [I.V.:886.6] Out: -  Intake/Output this shift: Total I/O In: 567.5 [P.O.:400; I.V.:167.5] Out: -   Scheduled Meds: . acyclovir  400 mg Oral QHS  . aspirin EC  81 mg Oral Daily  . atorvastatin  40 mg Oral q1800  . carvedilol  3.125 mg Oral BID WC  . clonazePAM  0.5 mg Oral QHS  . loratadine  10 mg Oral Daily  . pantoprazole  40 mg Oral Daily  . sodium chloride  3 mL Intravenous Q12H  . sodium chloride  3 mL Intravenous Q12H   Continuous Infusions: . heparin 1,350 Units/hr (01/12/13 0815)   PRN Meds:.sodium chloride,  acetaminophen, morphine injection, ondansetron (ZOFRAN) IV, ondansetron, sodium chloride  General appearance: alert and cooperative Neurologic: intact Heart: regular rate and rhythm, S1, S2 normal, no murmur, click, rub or gallop Lungs: clear to auscultation bilaterally Abdomen: soft, non-tender; bowel sounds normal; no masses,  no organomegaly Extremities: extremities normal, atraumatic, no cyanosis or edema and Homans sign is negative, no sign of DVT  Lab Results: CBC: Recent Labs  01/10/13 1707 01/12/13 0705  WBC 7.3 7.9  HGB 14.7 13.8  HCT 42.4 40.6  PLT 268 248   BMET:  Recent Labs  01/10/13 1707 01/12/13 0705  NA 132* 141  K 3.9 3.9  CL 95* 105  CO2 25 26  GLUCOSE 125* 103*  BUN 17 16  CREATININE 1.22 1.20  CALCIUM 9.4 8.9    PT/INR:  Recent Labs  01/10/13 2108  LABPROT 13.3  INR 1.03  P2Y12: 10    Radiology Dg Chest Port 1 View  01/10/2013   CLINICAL DATA:  Chest pain  EXAM: PORTABLE CHEST - 1 VIEW  COMPARISON:  12/23/2012  FINDINGS: The heart size and mediastinal contours are within normal limits. Both lungs are clear. The visualized skeletal structures are unremarkable.  IMPRESSION: No active disease.   Electronically Signed   By: Alcide Clever M.D.   On: 01/10/2013 17:31     Assessment/Plan: S/P Procedure(s) (LRB): LEFT HEART CATH (N/A) platelet function very inhibited Wait for washout, CABG later in week depending on P2Y12   [name] MD 01/12/2013 1:52 PM

## 2013-01-13 ENCOUNTER — Other Ambulatory Visit: Payer: Self-pay | Admitting: *Deleted

## 2013-01-13 ENCOUNTER — Inpatient Hospital Stay (HOSPITAL_COMMUNITY): Payer: BC Managed Care – PPO

## 2013-01-13 DIAGNOSIS — I2510 Atherosclerotic heart disease of native coronary artery without angina pectoris: Secondary | ICD-10-CM

## 2013-01-13 LAB — PULMONARY FUNCTION TEST
FEF 25-75 Post: 2.66 L/sec
FEF 25-75 Pre: 2.47 L/sec
FEF2575-%Change-Post: 7 %
FEF2575-%Pred-Post: 79 %
FEF2575-%Pred-Pre: 73 %
FEV1-%Change-Post: 1 %
FEV1-%Pred-Post: 82 %
FEV1-%Pred-Pre: 81 %
FEV1-Post: 3.25 L
FEV1-Pre: 3.19 L
FEV1FVC-%Change-Post: 1 %
FEV1FVC-%Pred-Pre: 97 %
FEV6-%Change-Post: 0 %
FEV6-%Pred-Post: 86 %
FEV6-%Pred-Pre: 85 %
FEV6-Post: 4.25 L
FEV6-Pre: 4.22 L
FEV6FVC-%Change-Post: 0 %
FEV6FVC-%Pred-Post: 103 %
FEV6FVC-%Pred-Pre: 103 %
FVC-%Change-Post: 0 %
FVC-%Pred-Post: 83 %
FVC-%Pred-Pre: 83 %
FVC-Post: 4.27 L
FVC-Pre: 4.25 L
Post FEV1/FVC ratio: 76 %
Post FEV6/FVC ratio: 100 %
Pre FEV1/FVC ratio: 75 %
Pre FEV6/FVC Ratio: 99 %

## 2013-01-13 LAB — CBC
HCT: 41.3 % (ref 39.0–52.0)
Hemoglobin: 14 g/dL (ref 13.0–17.0)
MCH: 30.7 pg (ref 26.0–34.0)
MCHC: 33.9 g/dL (ref 30.0–36.0)

## 2013-01-13 LAB — PLATELET INHIBITION P2Y12: Platelet Function  P2Y12: 68 [PRU] — ABNORMAL LOW (ref 194–418)

## 2013-01-13 MED ORDER — ALPRAZOLAM 0.25 MG PO TABS
0.2500 mg | ORAL_TABLET | Freq: Two times a day (BID) | ORAL | Status: DC | PRN
  Administered 2013-01-15: 0.25 mg via ORAL
  Filled 2013-01-13: qty 1

## 2013-01-13 MED ORDER — ALBUTEROL SULFATE (5 MG/ML) 0.5% IN NEBU
2.5000 mg | INHALATION_SOLUTION | Freq: Once | RESPIRATORY_TRACT | Status: AC
  Administered 2013-01-13: 2.5 mg via RESPIRATORY_TRACT

## 2013-01-13 MED ORDER — ALUM & MAG HYDROXIDE-SIMETH 200-200-20 MG/5ML PO SUSP
30.0000 mL | Freq: Four times a day (QID) | ORAL | Status: DC | PRN
  Administered 2013-01-13: 30 mL via ORAL
  Filled 2013-01-13 (×2): qty 30

## 2013-01-13 MED ORDER — SODIUM CHLORIDE 0.9 % IV SOLN
INTRAVENOUS | Status: DC
  Administered 2013-01-13: 07:00:00 via INTRAVENOUS

## 2013-01-13 NOTE — Care Management Note (Signed)
  Page 1 of 1   01/13/2013     9:42:09 AM   CARE MANAGEMENT NOTE 01/13/2013  Patient:  Andrew Sampson, Andrew Sampson   Account Number:  [id number]  Date Initiated:  01/13/2013  Documentation initiated by:  Kobi Mario  Subjective/Objective Assessment:   adm with dx of [place]; lives with spouse, independent PTA    PCP  Dr [name]     DC Planning Services  CM consult      Per UR Regulation:  Reviewed for med. necessity/level of care/duration of stay

## 2013-01-13 NOTE — Progress Notes (Signed)
ANTICOAGULATION CONSULT NOTE - Follow Up Consult  Pharmacy Consult for Heparin Indication: chest pain/ACS  No Known Allergies  Patient Measurements: Height: 5\' 11"  (180.3 cm) Weight: 166 lb 14.2 oz (75.7 kg) IBW/kg (Calculated) : 75.3 Heparin Dosing Weight: 76kg  Vital Signs: Temp: 98.2 F (36.8 C) (12/15 0500) Temp src: Oral (12/15 0500) BP: 106/58 mmHg (12/15 0500)  Labs:  Recent Labs  01/10/13 1707 01/10/13 2108  01/11/13 0246 01/11/13 0843 01/12/13 0508 01/12/13 0705 01/13/13 0540  HGB 14.7  --   --   --   --   --  13.8 14.0  HCT 42.4  --   --   --   --   --  40.6 41.3  PLT 268  --   --   --   --   --  248 246  APTT  --  26  --   --   --   --   --   --   LABPROT  --  13.3  --   --   --   --   --   --   INR  --  1.03  --   --   --   --   --   --   HEPARINUNFRC  --   --   < > 0.33 0.13* 0.45 0.27* 0.30  CREATININE 1.22  --   --   --   --   --  1.20  --   TROPONINI  --  1.62*  --  8.31* 14.58*  --   --   --   < > = values in this interval not displayed.  Estimated Creatinine Clearance: 75 ml/min (by C-G formula based on Cr of 1.2).   Medications:  Heparin @ 1350 units/hr  Assessment: 52yom continues on IV heparin for severe 3v CAD awaiting CABG once Effient washout complete.  Heparin level is at the low end of therapeutic this morning at 0.3.  No bleeding reported. CBC stable.   Goal of Therapy:  Heparin level 0.3-0.7 units/ml Monitor platelets by anticoagulation protocol: Yes   Plan:  1) increase heparin to 1500 units/hr 2) daily heparin level and CBC  Herby Abraham, Pharm.D. [phone] 01/13/2013 8:48 AM

## 2013-01-13 NOTE — Progress Notes (Signed)
[address]       [place] [address]             [phone]                   2 Days Post-Op Procedure(s) (LRB): LEFT HEART CATH (N/A)  LOS: 3 days   Subjective: No chest pain, some upper throat discomfort drinking orange juice  Objective: Vital signs in last 24 hours: Patient Vitals for the past 24 hrs:  BP Temp Temp src Pulse Resp SpO2  01/13/13 1006 112/75 mmHg - - 76 - -  01/13/13 0800 100/71 mmHg 98.3 F (36.8 C) Oral - - 97 %  01/13/13 0500 106/58 mmHg 98.2 F (36.8 C) Oral - - 98 %  01/13/13 0400 - 97.9 F (36.6 C) Oral - - -  01/13/13 0000 103/58 mmHg 97.9 F (36.6 C) Oral - - 96 %  01/12/13 2347 - 97.9 F (36.6 C) Oral - - 96 %  01/12/13 2000 110/63 mmHg 98 F (36.7 C) Oral 66 - 98 %  01/12/13 1949 - 98 F (36.7 C) Oral - - 96 %  01/12/13 1654 117/85 mmHg 97.9 F (36.6 C) Oral 93 20 98 %  01/12/13 1208 127/80 mmHg 98 F (36.7 C) Oral 93 18 98 %    Filed Weights   01/10/13 2125  Weight: 166 lb 14.2 oz (75.7 kg)    Hemodynamic parameters for last 24 hours:    Intake/Output from previous day: 12/14 0701 - 12/15 0700 In: 1283.5 [P.O.:550; I.V.:733.5] Out: -  Intake/Output this shift: Total I/O In: 43.5 [I.V.:43.5] Out: -   Scheduled Meds: . acyclovir  400 mg Oral QHS  . aspirin EC  81 mg Oral Daily  . atorvastatin  40 mg Oral q1800  . carvedilol  3.125 mg Oral BID WC  . clonazePAM  0.5 mg Oral QHS  . loratadine  10 mg Oral Daily  . pantoprazole  40 mg Oral Daily  . sodium chloride  3 mL Intravenous Q12H  . sodium chloride  3 mL Intravenous Q12H   Continuous Infusions: . sodium chloride    . heparin 1,500 Units/hr (01/13/13 1008)   PRN Meds:.sodium chloride, acetaminophen, morphine injection, ondansetron (ZOFRAN) IV, ondansetron, sodium chloride  General appearance: alert and cooperative Neurologic: intact Heart: regular rate and rhythm, S1, S2 normal, no murmur, click, rub or gallop Lungs: clear to  auscultation bilaterally Abdomen: soft, non-tender; bowel sounds normal; no masses,  no organomegaly Extremities: extremities normal, atraumatic, no cyanosis or edema and Homans sign is negative, no sign of DVT Some bruising left radial, rt groin ok  Lab Results: CBC:  Recent Labs  01/12/13 0705 01/13/13 0540  WBC 7.9 8.3  HGB 13.8 14.0  HCT 40.6 41.3  PLT 248 246   BMET:   Recent Labs  01/10/13 1707 01/12/13 0705  NA 132* 141  K 3.9 3.9  CL 95* 105  CO2 25 26  GLUCOSE 125* 103*  BUN 17 16  CREATININE 1.22 1.20  CALCIUM 9.4 8.9    PT/INR:   Recent Labs  01/10/13 2108  LABPROT 13.3  INR 1.03   P2Y12: 10 yesterday, now 5   Radiology No results found.   Assessment/Plan: S/P Procedure(s) (LRB): LEFT HEART CATH (N/A) platelet function very inhibited Wait for washout, CABG Wednesday depending on p2y12 Trouble with cath through left radial, patient prefers not to use radial   [name] MD 01/13/2013 10:43  AM

## 2013-01-13 NOTE — Progress Notes (Signed)
CARDIAC REHAB PHASE I   PRE:  Rate/Rhythm: 69SR  BP:  Supine:   Sitting: 112/75  Standing:    SaO2:   MODE:  Ambulation: 700 ft   POST:  Rate/Rhythm: 76SR  BP:  Supine:   Sitting: 113/71  Standing:    SaO2:  1045-1135 Pt walked 700 ft with steady gait. No increase in any shoulder or chest discomfort. Pt stated he walked in hall yesterday and he likes to walk to get out of room. Encouraged him not to walk more than 3 times a day, rest 3-4 hours between, and not go farther than we did. Pre op ed completed with pt. He has OHS booklet, has seen pre op video, and has IS. Can get to 2500 ml on IS. Discussed sternal precautions and pt demonstrated how to get up and down without use of arms. Discussed importance of mobility after surgery which pt states will not be a problem for him as he is very active. Pt anxious, emotional support given.   Luetta Nutting, RN BSN  01/13/2013 11:32 AM

## 2013-01-13 NOTE — Progress Notes (Addendum)
     Subjective:  Anxious. Feels a bit negative at the current moment. Mild left upper shoulder pain. Had syncope previously with NTG. Cath site normal.   Objective:  Vital Signs in the last 24 hours: Temp:  [97.9 F (36.6 C)-98.3 F (36.8 C)] 98.3 F (36.8 C) (12/15 0800) Pulse Rate:  [66-93] 66 (12/14 2000) Resp:  [18-20] 20 (12/14 1654) BP: (100-127)/(58-85) 100/71 mmHg (12/15 0800) SpO2:  [96 %-98 %] 97 % (12/15 0800)  Intake/Output from previous day: 12/14 0701 - 12/15 0700 In: 1283.5 [P.O.:550; I.V.:733.5] Out: -    Physical Exam: General: Well developed, well nourished, in no acute distress. Head:  Normocephalic and atraumatic. Lungs: Clear to auscultation and percussion. Heart: Normal S1 and S2.  No murmur, rubs or gallops.  Abdomen: soft, non-tender, positive bowel sounds. Extremities: No clubbing or cyanosis. No edema. Cath site c/d/i, no hematoma.  Neurologic: Alert and oriented x 3. Mildly anxious.    Lab Results:  Recent Labs  01/12/13 0705 01/13/13 0540  WBC 7.9 8.3  HGB 13.8 14.0  PLT 248 246    Recent Labs  01/10/13 1707 01/12/13 0705  NA 132* 141  K 3.9 3.9  CL 95* 105  CO2 25 26  GLUCOSE 125* 103*  BUN 17 16  CREATININE 1.22 1.20    Recent Labs  01/11/13 0246 01/11/13 0843  TROPONINI 8.31* 14.58*   Hepatic Function Panel   Recent Labs  01/11/13 0246  CHOL 273*    Imaging: No results found. Personally viewed.   Telemetry: 7 beats of WCT.  Personally viewed.   EKG:  12/12 - ectopic atrial rhythm, NSSTW changes.   Cardiac Studies:   CATH - 3v CAD (reviewed Dr. Erlinda Hong note),   ECHO -  - Left ventricle: Possible inferolateral wall hypokinesis but image quality is poor Wall thickness was increased in a pattern of mild LVH. Systolic function was normal. The estimated ejection fraction was in the range of 50% to 55%. - Aortic valve: Mild regurgitation. - Atrial septum: No defect or patent foramen ovale  was identified.   Assessment/Plan:  Principal Problem:   SEMI (subendocardial myocardial infarction) Active Problems:   Atypical chest pain   Hyperlipidemia   Statin intolerance   GERD (gastroesophageal reflux disease)   Anxiety   Familial hyperlipidemia  1) NSTEMI - severe 3 v CAD. Dr. [name] discussed plan with Andrew Sampson. Effient 60mg  given 01/11/13. Planned CABG when PLT's allow.  Heparin IV. Troponin 14. Platelet function 10 at 0705 12/14.   2) CAD - beta blocker. BP does not allow up titration at this point. Coreg 3.125 BID. ASA. EF normal. I will avoid long acting nitrates due to prior syncope.   3) Familial Hyperipidemia - Prior statin intolerance. Tried several. Leg cramps. Avid mountain biker. Will retry. I will give atorvastatin 40mg  PO QD. LDL 214.   4) Anxiety - Xanax PRN 0.25mg . BID. Tried to encourage him. He is very fortunate to have warning signs and be in this position to have revascularization. Continue to counsel.    Await CABG.     Andrew Sampson 01/13/2013, 9:53 AM

## 2013-01-14 ENCOUNTER — Encounter (HOSPITAL_COMMUNITY): Payer: Self-pay | Admitting: Certified Registered"

## 2013-01-14 LAB — URINALYSIS, ROUTINE W REFLEX MICROSCOPIC
Bilirubin Urine: NEGATIVE
Glucose, UA: NEGATIVE mg/dL
Hgb urine dipstick: NEGATIVE
Ketones, ur: NEGATIVE mg/dL
Leukocytes, UA: NEGATIVE
Nitrite: NEGATIVE
Protein, ur: NEGATIVE mg/dL

## 2013-01-14 LAB — BLOOD GAS, ARTERIAL
Acid-Base Excess: 1 mmol/L (ref 0.0–2.0)
Bicarbonate: 24.9 mEq/L — ABNORMAL HIGH (ref 20.0–24.0)
Drawn by: 296031
FIO2: 0.21 %
O2 Saturation: 96.7 %
Patient temperature: 98.6
TCO2: 26.1 mmol/L (ref 0–100)
pCO2 arterial: 38.2 mmHg (ref 35.0–45.0)
pH, Arterial: 7.429 (ref 7.350–7.450)
pO2, Arterial: 83.1 mmHg (ref 80.0–100.0)

## 2013-01-14 LAB — SURGICAL PCR SCREEN
MRSA, PCR: NEGATIVE
Staphylococcus aureus: NEGATIVE

## 2013-01-14 LAB — COMPREHENSIVE METABOLIC PANEL
ALT: 47 U/L (ref 0–53)
AST: 36 U/L (ref 0–37)
Albumin: 3.6 g/dL (ref 3.5–5.2)
Alkaline Phosphatase: 48 U/L (ref 39–117)
BUN: 12 mg/dL (ref 6–23)
CO2: 27 mEq/L (ref 19–32)
Calcium: 8.9 mg/dL (ref 8.4–10.5)
Chloride: 104 mEq/L (ref 96–112)
Creatinine, Ser: 1.15 mg/dL (ref 0.50–1.35)
GFR calc Af Amer: 82 mL/min — ABNORMAL LOW (ref >90)
GFR calc non Af Amer: 70 mL/min — ABNORMAL LOW (ref >90)
Glucose, Bld: 101 mg/dL — ABNORMAL HIGH (ref 70–99)
Potassium: 3.5 mEq/L (ref 3.5–5.1)
Sodium: 140 mEq/L (ref 135–145)
Total Bilirubin: 0.2 mg/dL — ABNORMAL LOW (ref 0.3–1.2)
Total Protein: 6.4 g/dL (ref 6.0–8.3)

## 2013-01-14 LAB — TYPE AND SCREEN
ABO/RH(D): A POS
Antibody Screen: NEGATIVE

## 2013-01-14 LAB — CBC
HCT: 39.8 % (ref 39.0–52.0)
MCHC: 33.7 g/dL (ref 30.0–36.0)
RDW: 12.7 % (ref 11.5–15.5)

## 2013-01-14 LAB — PROTIME-INR
INR: 1.11 (ref 0.00–1.49)
Prothrombin Time: 14.1 seconds (ref 11.6–15.2)

## 2013-01-14 LAB — HEPARIN LEVEL (UNFRACTIONATED): Heparin Unfractionated: 0.45 IU/mL (ref 0.30–0.70)

## 2013-01-14 LAB — ABO/RH: ABO/RH(D): A POS

## 2013-01-14 LAB — PLATELET INHIBITION P2Y12: Platelet Function  P2Y12: 122 [PRU] — ABNORMAL LOW (ref 194–418)

## 2013-01-14 MED ORDER — MAGNESIUM SULFATE 50 % IJ SOLN
40.0000 meq | INTRAMUSCULAR | Status: DC
  Filled 2013-01-14: qty 10

## 2013-01-14 MED ORDER — POTASSIUM CHLORIDE 2 MEQ/ML IV SOLN
80.0000 meq | INTRAVENOUS | Status: DC
  Filled 2013-01-14: qty 40

## 2013-01-14 MED ORDER — SODIUM CHLORIDE 0.9 % IV SOLN
INTRAVENOUS | Status: DC
  Filled 2013-01-14: qty 1

## 2013-01-14 MED ORDER — CEFUROXIME SODIUM 1.5 G IJ SOLR
1.5000 g | INTRAMUSCULAR | Status: DC
  Filled 2013-01-14 (×2): qty 1.5

## 2013-01-14 MED ORDER — NITROGLYCERIN IN D5W 200-5 MCG/ML-% IV SOLN
2.0000 ug/min | INTRAVENOUS | Status: DC
  Filled 2013-01-14: qty 250

## 2013-01-14 MED ORDER — BISACODYL 5 MG PO TBEC
5.0000 mg | DELAYED_RELEASE_TABLET | Freq: Once | ORAL | Status: AC
Start: 2013-01-14 — End: 2013-01-14
  Administered 2013-01-14: 5 mg via ORAL
  Filled 2013-01-14: qty 1

## 2013-01-14 MED ORDER — DEXTROSE 5 % IV SOLN
750.0000 mg | INTRAVENOUS | Status: DC
  Filled 2013-01-14 (×2): qty 750

## 2013-01-14 MED ORDER — DEXMEDETOMIDINE HCL IN NACL 400 MCG/100ML IV SOLN
0.1000 ug/kg/h | INTRAVENOUS | Status: DC
  Filled 2013-01-14: qty 100

## 2013-01-14 MED ORDER — HEPARIN SODIUM (PORCINE) 1000 UNIT/ML IJ SOLN
INTRAMUSCULAR | Status: DC
  Filled 2013-01-14: qty 30

## 2013-01-14 MED ORDER — DOPAMINE-DEXTROSE 3.2-5 MG/ML-% IV SOLN
2.0000 ug/kg/min | INTRAVENOUS | Status: DC
  Filled 2013-01-14: qty 250

## 2013-01-14 MED ORDER — METOPROLOL TARTRATE 12.5 MG HALF TABLET
12.5000 mg | ORAL_TABLET | Freq: Once | ORAL | Status: AC
  Administered 2013-01-15: 12.5 mg via ORAL
  Filled 2013-01-14: qty 1

## 2013-01-14 MED ORDER — PHENYLEPHRINE HCL 10 MG/ML IJ SOLN
30.0000 ug/min | INTRAVENOUS | Status: DC
  Filled 2013-01-14: qty 2

## 2013-01-14 MED ORDER — CHLORHEXIDINE GLUCONATE 4 % EX LIQD
CUTANEOUS | Status: AC
  Administered 2013-01-15: 01:00:00
  Filled 2013-01-14: qty 15

## 2013-01-14 MED ORDER — VANCOMYCIN HCL 10 G IV SOLR
1250.0000 mg | INTRAVENOUS | Status: AC
  Administered 2013-01-15: 1250 mg via INTRAVENOUS
  Filled 2013-01-14: qty 1250

## 2013-01-14 MED ORDER — PLASMA-LYTE 148 IV SOLN
INTRAVENOUS | Status: AC
  Administered 2013-01-15: 08:00:00
  Filled 2013-01-14: qty 2.5

## 2013-01-14 MED ORDER — SODIUM CHLORIDE 0.9 % IV SOLN
INTRAVENOUS | Status: DC
  Filled 2013-01-14: qty 40

## 2013-01-14 MED ORDER — DEXTROSE 5 % IV SOLN
0.5000 ug/min | INTRAVENOUS | Status: DC
  Filled 2013-01-14: qty 4

## 2013-01-14 MED ORDER — TEMAZEPAM 15 MG PO CAPS
15.0000 mg | ORAL_CAPSULE | Freq: Once | ORAL | Status: AC | PRN
  Filled 2013-01-14: qty 1

## 2013-01-14 NOTE — Progress Notes (Signed)
Patient ID: [name], male   DOB: 02-21-1960, 52 y.o.   MRN: [id number]      [address]       [place] [address]             [phone]                 3 Days Post-Op Procedure(s) (LRB): LEFT HEART CATH (N/A)  LOS: 4 days   Subjective: No chest pain, more relaxed then yesterday  Objective: Vital signs in last 24 hours: Patient Vitals for the past 24 hrs:  BP Temp Temp src Pulse Resp SpO2  01/14/13 0900 104/58 mmHg - - - - -  01/14/13 0859 104/58 mmHg - - 61 - -  01/14/13 0806 98/59 mmHg 98.5 F (36.9 C) Oral 72 18 98 %  01/14/13 0426 103/69 mmHg 98.4 F (36.9 C) Oral - - -  01/13/13 2331 101/59 mmHg - - - - -  01/13/13 2300 - 97.7 F (36.5 C) Oral - - 97 %  01/13/13 1734 136/71 mmHg - - 69 - -  01/13/13 1638 87/49 mmHg 97.3 F (36.3 C) Axillary 72 25 97 %    Filed Weights   01/10/13 2125  Weight: 166 lb 14.2 oz (75.7 kg)    Hemodynamic parameters for last 24 hours:    Intake/Output from previous day: 12/15 0701 - 12/16 0700 In: 1098.3 [P.O.:500; I.V.:598.3] Out: 400 [Urine:400] Intake/Output this shift: Total I/O In: 153 [I.V.:153] Out: -   Scheduled Meds: . acyclovir  400 mg Oral QHS  . aspirin EC  81 mg Oral Daily  . atorvastatin  40 mg Oral q1800  . carvedilol  3.125 mg Oral BID WC  . clonazePAM  0.5 mg Oral QHS  . loratadine  10 mg Oral Daily  . pantoprazole  40 mg Oral Daily  . sodium chloride  3 mL Intravenous Q12H  . sodium chloride  3 mL Intravenous Q12H   Continuous Infusions: . sodium chloride 10 mL/hr at 01/13/13 0700  . heparin 1,500 Units/hr (01/14/13 0900)   PRN Meds:.sodium chloride, acetaminophen, ALPRAZolam, alum & mag hydroxide-simeth, morphine injection, ondansetron (ZOFRAN) IV, ondansetron, sodium chloride  General appearance: alert and cooperative Neurologic: intact Heart: regular rate and rhythm, S1, S2 normal, no murmur, click, rub or gallop Lungs: clear to auscultation bilaterally Abdomen: soft,  non-tender; bowel sounds normal; no masses,  no organomegaly Extremities: extremities normal, atraumatic, no cyanosis or edema  Lab Results: CBC: Recent Labs  01/13/13 0540 01/14/13 0420  WBC 8.3 7.7  HGB 14.0 13.4  HCT 41.3 39.8  PLT 246 247   BMET:  Recent Labs  01/12/13 0705 01/14/13 0420  NA 141 140  K 3.9 3.5  CL 105 104  CO2 26 27  GLUCOSE 103* 101*  BUN 16 12  CREATININE 1.20 1.15  CALCIUM 8.9 8.9    PT/INR:  Recent Labs  01/14/13 0420  LABPROT 14.1  INR 1.11   p2y12 120  Radiology No results found.   Assessment/Plan: S/P Procedure(s) (LRB): LEFT HEART CATH (N/A) Plan to proceed with CABG in am. The goals risks and alternatives of the planned surgical procedure CABG  have been discussed with the patient in detail. The risks of the procedure including death, infection, stroke, myocardial infarction, bleeding, blood transfusion have all been discussed specifically.  I have quoted Andrew Sampson a 2 % of perioperative mortality and a complication rate as high as 15 %. The patient's questions have been  answered.Andrew Sampson is willing  to proceed with the planned procedure.   Andrew Ovens MD 01/14/2013 1:57 PM

## 2013-01-14 NOTE — Progress Notes (Signed)
CARDIAC REHAB PHASE I   PRE:  Rate/Rhythm: 79SR  BP:  Supine:   Sitting: 108/48  Standing:    SaO2: 94%RA  MODE:  Ambulation: 1050 ft   POST:  Rate/Rhythm: 82SR  BP:  Supine:   Sitting: 112/90  Standing:    SaO2: 98%RA 1124-1200 Pt walked 1050 ft on RA with steady gait. Tolerated well. No CP. Will follow up after surgery.   Luetta Nutting, RN BSN  01/14/2013 11:55 AM

## 2013-01-14 NOTE — Progress Notes (Signed)
SUBJECTIVE:  No complaints.  Anxious about upcoming surgery  OBJECTIVE:   Vitals:   Filed Vitals:   01/14/13 0426 01/14/13 0806 01/14/13 0859 01/14/13 0900  BP: 103/69 98/59 104/58 104/58  Pulse:  72 61   Temp: 98.4 F (36.9 C) 98.5 F (36.9 C)    TempSrc: Oral Oral    Resp:  18    Height:      Weight:      SpO2:  98%     I&O's:   Intake/Output Summary (Last 24 hours) at 01/14/13 1312 Last data filed at 01/14/13 1100  Gross per 24 hour  Intake    553 ml  Output    400 ml  Net    153 ml   TELEMETRY: Reviewed telemetry pt in NSR:     PHYSICAL EXAM General: Well developed, well nourished, in no acute distress Head: Eyes PERRLA, No xanthomas.   Normal cephalic and atramatic  Lungs:   Clear bilaterally to auscultation and percussion. Heart:   HRRR S1 S2 Pulses are 2+ & equal. Abdomen: Bowel sounds are positive, abdomen soft and non-tender without masses  Extremities:   No clubbing, cyanosis or edema.  DP +1 Neuro: Alert and oriented X 3. Psych:  Good affect, responds appropriately   LABS: Basic Metabolic Panel:  Recent Labs  78/29/56 0705 01/14/13 0420  NA 141 140  K 3.9 3.5  CL 105 104  CO2 26 27  GLUCOSE 103* 101*  BUN 16 12  CREATININE 1.20 1.15  CALCIUM 8.9 8.9   Liver Function Tests:  Recent Labs  01/14/13 0420  AST 36  ALT 47  ALKPHOS 48  BILITOT 0.2*  PROT 6.4  ALBUMIN 3.6   No results found for this basename: LIPASE, AMYLASE,  in the last 72 hours CBC:  Recent Labs  01/13/13 0540 01/14/13 0420  WBC 8.3 7.7  HGB 14.0 13.4  HCT 41.3 39.8  MCV 90.6 90.5  PLT 246 247   Cardiac Enzymes: No results found for this basename: CKTOTAL, CKMB, CKMBINDEX, TROPONINI,  in the last 72 hours BNP: No components found with this basename: POCBNP,  D-Dimer: No results found for this basename: DDIMER,  in the last 72 hours Hemoglobin A1C: No results found for this basename: HGBA1C,  in the last 72 hours Fasting Lipid Panel: No results found  for this basename: CHOL, HDL, LDLCALC, TRIG, CHOLHDL, LDLDIRECT,  in the last 72 hours Thyroid Function Tests: No results found for this basename: TSH, T4TOTAL, FREET3, T3FREE, THYROIDAB,  in the last 72 hours Anemia Panel: No results found for this basename: VITAMINB12, FOLATE, FERRITIN, TIBC, IRON, RETICCTPCT,  in the last 72 hours Coag Panel:   Lab Results  Component Value Date   INR 1.11 01/14/2013   INR 1.03 01/10/2013    RADIOLOGY: Dg Chest Port 1 View  01/10/2013   CLINICAL DATA:  Chest pain  EXAM: PORTABLE CHEST - 1 VIEW  COMPARISON:  12/23/2012  FINDINGS: The heart size and mediastinal contours are within normal limits. Both lungs are clear. The visualized skeletal structures are unremarkable.  IMPRESSION: No active disease.   Electronically Signed   By: Alcide Clever M.D.   On: 01/10/2013 17:31   Assessment/Plan:  Principal Problem:  SEMI (subendocardial myocardial infarction)  Active Problems:  Atypical chest pain  Hyperlipidemia  Statin intolerance  GERD (gastroesophageal reflux disease)  Anxiety  Familial hyperlipidemia   1) NSTEMI - severe 3 v CAD. Dr. [name] discussed plan with Andrew Sampson. Effient 60mg   given 01/11/13. Planned CABG tomorrow.  Platelet function today 122.  Heparin IV. Troponin 14.   2) CAD - beta blocker. BP does not allow up titration at this point. Coreg 3.125 BID. ASA. EF low normal. I will avoid long acting nitrates due to prior syncope.  3) Familial Hyperipidemia - Prior statin intolerance. Tried several. Leg cramps. Avid mountain biker. Will retry. I will give atorvastatin 40mg  PO QD. LDL 214.  4) Anxiety - Xanax PRN 0.25mg . BID. Tried to encourage him. He is very fortunate to have warning signs and be in this position to have revascularization. Continue to counsel.   Await CABG.        Andrew Reichert, MD  01/14/2013  1:12 PM

## 2013-01-14 NOTE — Progress Notes (Signed)
ANTICOAGULATION CONSULT NOTE - Follow Up Consult  Pharmacy Consult for Heparin Indication: chest pain/ACS  No Known Allergies  Patient Measurements: Height: 5\' 11"  (180.3 cm) Weight: 166 lb 14.2 oz (75.7 kg) IBW/kg (Calculated) : 75.3 Heparin Dosing Weight: 76kg  Vital Signs: Temp: 98.5 F (36.9 C) (12/16 0806) Temp src: Oral (12/16 0806) BP: 104/58 mmHg (12/16 0859) Pulse Rate: 61 (12/16 0859)  Labs:  Recent Labs  01/12/13 0705 01/13/13 0540 01/14/13 0420  HGB 13.8 14.0 13.4  HCT 40.6 41.3 39.8  PLT 248 246 247  LABPROT  --   --  14.1  INR  --   --  1.11  HEPARINUNFRC 0.27* 0.30 0.45  CREATININE 1.20  --  1.15    Estimated Creatinine Clearance: 78.2 ml/min (by C-G formula based on Cr of 1.15).   Medications:  Heparin @ 1350 units/hr  Assessment: 52yom admitted 01/10/2013 with CP.  Pending P2Y12 wash out for CABG for severe 3v CAD  PMH: CAD, Anxiety, hyperlipidemia, GERD, statin intolerance  Anticoagulation: Heparin for ACS, s/p cath (multi-vessel dz), awaiting Effient wash-out for CABG, HL at goal,  ID: Acyclovir from PTA - afebrile, WBC WNL, also on azithromycin PTA  Cardiovascular: HLD (statin intolerance, ??rxn) LDL 214, TC 273, TG 209, troponin trending up - on ASA, Coreg (Lovaza PTA)  Endocrinology: TSH WNL. No hx DM - AM glucose WNL  GI: GERD - heart diet, PPI PO  Nephrology:  CrCL ~ 75-80 ml/min,   Neuro: clonazepam QHS  Pulmonary: 2L [place] >> RA  Hematology / Oncology: H/H/plts WNL  Best Practices: heparin gtt, home meds addressed  Goal of Therapy:  Heparin level 0.3-0.7 units/ml Monitor platelets by anticoagulation protocol: Yes   Plan:  1) Continue heparin at 1500 units/hr 2) daily heparin level and CBC    Thank you for allowing pharmacy to be a part of this patients care team.  Lovenia Kim Pharm.D., BCPS Clinical Pharmacist 01/14/2013 10:52 AM Pager: [phone] Phone: [phone]

## 2013-01-15 ENCOUNTER — Encounter (HOSPITAL_COMMUNITY): Payer: Self-pay | Admitting: Anesthesiology

## 2013-01-15 ENCOUNTER — Encounter (HOSPITAL_COMMUNITY)
Admission: EM | Disposition: A | Discharge status: Home / Self Care | Payer: BC Managed Care – PPO | Source: Home / Self Care | Attending: Cardiothoracic Surgery

## 2013-01-15 ENCOUNTER — Encounter (HOSPITAL_COMMUNITY): Payer: BC Managed Care – PPO | Admitting: Certified Registered"

## 2013-01-15 ENCOUNTER — Inpatient Hospital Stay (HOSPITAL_COMMUNITY): Payer: BC Managed Care – PPO

## 2013-01-15 ENCOUNTER — Inpatient Hospital Stay (HOSPITAL_COMMUNITY): Payer: BC Managed Care – PPO | Admitting: Certified Registered"

## 2013-01-15 DIAGNOSIS — Z951 Presence of aortocoronary bypass graft: Secondary | ICD-10-CM

## 2013-01-15 DIAGNOSIS — I2510 Atherosclerotic heart disease of native coronary artery without angina pectoris: Secondary | ICD-10-CM

## 2013-01-15 HISTORY — PX: CORONARY ARTERY BYPASS GRAFT: SHX141

## 2013-01-15 HISTORY — PX: INTRAOPERATIVE TRANSESOPHAGEAL ECHOCARDIOGRAM: SHX5062

## 2013-01-15 HISTORY — DX: Presence of aortocoronary bypass graft: Z95.1

## 2013-01-15 LAB — POCT I-STAT 3, ART BLOOD GAS (G3+)
Acid-Base Excess: 3 mmol/L — ABNORMAL HIGH (ref 0.0–2.0)
Acid-Base Excess: 1 mmol/L (ref 0.0–2.0)
Acid-Base Excess: 1 mmol/L (ref 0.0–2.0)
Bicarbonate: 28.3 mEq/L — ABNORMAL HIGH (ref 20.0–24.0)
Bicarbonate: 27.2 mEq/L — ABNORMAL HIGH (ref 20.0–24.0)
Bicarbonate: 26.9 mEq/L — ABNORMAL HIGH (ref 20.0–24.0)
Bicarbonate: 24.1 mEq/L — ABNORMAL HIGH (ref 20.0–24.0)
O2 Saturation: 100 %
O2 Saturation: 100 %
O2 Saturation: 98 %
O2 Saturation: 98 %
Patient temperature: 100.4
TCO2: 30 mmol/L (ref 0–100)
TCO2: 28 mmol/L (ref 0–100)
TCO2: 27 mmol/L (ref 0–100)
pCO2 arterial: 45.3 mmHg — ABNORMAL HIGH (ref 35.0–45.0)
pCO2 arterial: 41.1 mmHg (ref 35.0–45.0)
pCO2 arterial: 46 mmHg — ABNORMAL HIGH (ref 35.0–45.0)
pH, Arterial: 7.398 (ref 7.350–7.450)
pH, Arterial: 7.441 (ref 7.350–7.450)
pH, Arterial: 7.363 (ref 7.350–7.450)
pH, Arterial: 7.414 (ref 7.350–7.450)
pH, Arterial: 7.331 — ABNORMAL LOW (ref 7.350–7.450)
pO2, Arterial: 446 mmHg — ABNORMAL HIGH (ref 80.0–100.0)
pO2, Arterial: 94 mmHg (ref 80.0–100.0)

## 2013-01-15 LAB — POCT I-STAT 4, (NA,K, GLUC, HGB,HCT)
Glucose, Bld: 103 mg/dL — ABNORMAL HIGH (ref 70–99)
Glucose, Bld: 92 mg/dL (ref 70–99)
Glucose, Bld: 134 mg/dL — ABNORMAL HIGH (ref 70–99)
Glucose, Bld: 138 mg/dL — ABNORMAL HIGH (ref 70–99)
Glucose, Bld: 108 mg/dL — ABNORMAL HIGH (ref 70–99)
HCT: 35 % — ABNORMAL LOW (ref 39.0–52.0)
HCT: 26 % — ABNORMAL LOW (ref 39.0–52.0)
Hemoglobin: 13.6 g/dL (ref 13.0–17.0)
Hemoglobin: 8.8 g/dL — ABNORMAL LOW (ref 13.0–17.0)
Potassium: 3.9 mEq/L (ref 3.5–5.1)
Potassium: 4 mEq/L (ref 3.5–5.1)
Potassium: 4.5 mEq/L (ref 3.5–5.1)
Potassium: 3.9 mEq/L (ref 3.5–5.1)
Sodium: 142 mEq/L (ref 135–145)
Sodium: 138 mEq/L (ref 135–145)
Sodium: 134 mEq/L — ABNORMAL LOW (ref 135–145)
Sodium: 138 mEq/L (ref 135–145)
Sodium: 141 mEq/L (ref 135–145)

## 2013-01-15 LAB — GLUCOSE, CAPILLARY
Glucose-Capillary: 82 mg/dL (ref 70–99)
Glucose-Capillary: 91 mg/dL (ref 70–99)
Glucose-Capillary: 127 mg/dL — ABNORMAL HIGH (ref 70–99)
Glucose-Capillary: 132 mg/dL — ABNORMAL HIGH (ref 70–99)
Glucose-Capillary: 120 mg/dL — ABNORMAL HIGH (ref 70–99)

## 2013-01-15 LAB — POCT I-STAT, CHEM 8
Chloride: 103 mEq/L (ref 96–112)
Creatinine, Ser: 1.1 mg/dL (ref 0.50–1.35)
Glucose, Bld: 137 mg/dL — ABNORMAL HIGH (ref 70–99)
HCT: 31 % — ABNORMAL LOW (ref 39.0–52.0)
Hemoglobin: 10.5 g/dL — ABNORMAL LOW (ref 13.0–17.0)
Sodium: 140 mEq/L (ref 135–145)
TCO2: 23 mmol/L (ref 0–100)

## 2013-01-15 LAB — CBC
HCT: 32.6 % — ABNORMAL LOW (ref 39.0–52.0)
HCT: 29.7 % — ABNORMAL LOW (ref 39.0–52.0)
Hemoglobin: 11.5 g/dL — ABNORMAL LOW (ref 13.0–17.0)
Hemoglobin: 10.1 g/dL — ABNORMAL LOW (ref 13.0–17.0)
MCH: 30.4 pg (ref 26.0–34.0)
MCHC: 34 g/dL (ref 30.0–36.0)
MCV: 88.1 fL (ref 78.0–100.0)
MCV: 89.5 fL (ref 78.0–100.0)
Platelets: 243 10*3/uL (ref 150–400)
Platelets: 162 10*3/uL (ref 150–400)
RBC: 3.32 MIL/uL — ABNORMAL LOW (ref 4.22–5.81)
RDW: 12.5 % (ref 11.5–15.5)
RDW: 12.3 % (ref 11.5–15.5)
RDW: 12.4 % (ref 11.5–15.5)
WBC: 7.9 10*3/uL (ref 4.0–10.5)
WBC: 12.5 10*3/uL — ABNORMAL HIGH (ref 4.0–10.5)
WBC: 11.8 10*3/uL — ABNORMAL HIGH (ref 4.0–10.5)

## 2013-01-15 LAB — BASIC METABOLIC PANEL
BUN: 14 mg/dL (ref 6–23)
CO2: 25 mEq/L (ref 19–32)
Calcium: 9.3 mg/dL (ref 8.4–10.5)
Chloride: 103 mEq/L (ref 96–112)
Creatinine, Ser: 1.25 mg/dL (ref 0.50–1.35)
GFR calc Af Amer: 74 mL/min — ABNORMAL LOW (ref >90)
GFR calc non Af Amer: 64 mL/min — ABNORMAL LOW (ref >90)
Glucose, Bld: 103 mg/dL — ABNORMAL HIGH (ref 70–99)
Potassium: 4 mEq/L (ref 3.5–5.1)
Sodium: 140 mEq/L (ref 135–145)

## 2013-01-15 LAB — PROTIME-INR
INR: 1.37 (ref 0.00–1.49)
Prothrombin Time: 16.5 seconds — ABNORMAL HIGH (ref 11.6–15.2)

## 2013-01-15 LAB — CREATININE, SERUM
Creatinine, Ser: 1.11 mg/dL (ref 0.50–1.35)
GFR calc Af Amer: 85 mL/min — ABNORMAL LOW (ref >90)
GFR calc non Af Amer: 74 mL/min — ABNORMAL LOW (ref >90)

## 2013-01-15 LAB — MAGNESIUM: Magnesium: 2.8 mg/dL — ABNORMAL HIGH (ref 1.5–2.5)

## 2013-01-15 LAB — PLATELET COUNT: Platelets: 162 10*3/uL (ref 150–400)

## 2013-01-15 LAB — HEMOGLOBIN AND HEMATOCRIT, BLOOD
HCT: 28.1 % — ABNORMAL LOW (ref 39.0–52.0)
Hemoglobin: 9.8 g/dL — ABNORMAL LOW (ref 13.0–17.0)

## 2013-01-15 LAB — HEMOGLOBIN A1C
Hgb A1c MFr Bld: 5.7 % — ABNORMAL HIGH (ref <5.7)
Mean Plasma Glucose: 117 mg/dL — ABNORMAL HIGH (ref <117)

## 2013-01-15 LAB — HEPARIN LEVEL (UNFRACTIONATED): Heparin Unfractionated: 0.41 IU/mL (ref 0.30–0.70)

## 2013-01-15 LAB — APTT: aPTT: 28 seconds (ref 24–37)

## 2013-01-15 SURGERY — CORONARY ARTERY BYPASS GRAFTING (CABG)
Anesthesia: General | Site: Chest

## 2013-01-15 MED ORDER — 0.9 % SODIUM CHLORIDE (POUR BTL) OPTIME
TOPICAL | Status: DC | PRN
  Administered 2013-01-15: 6000 mL

## 2013-01-15 MED ORDER — ROCURONIUM BROMIDE 100 MG/10ML IV SOLN
INTRAVENOUS | Status: DC | PRN
  Administered 2013-01-15: 100 mg via INTRAVENOUS

## 2013-01-15 MED ORDER — INSULIN REGULAR BOLUS VIA INFUSION
0.0000 [IU] | Freq: Three times a day (TID) | INTRAVENOUS | Status: DC
  Filled 2013-01-15: qty 10

## 2013-01-15 MED ORDER — MIDAZOLAM HCL 2 MG/2ML IJ SOLN: 2.0000 mg | INTRAMUSCULAR | Status: DC | PRN

## 2013-01-15 MED ORDER — ATORVASTATIN CALCIUM 40 MG PO TABS
40.0000 mg | ORAL_TABLET | Freq: Every day | ORAL | Status: DC
  Administered 2013-01-16 – 2013-01-19 (×3): 40 mg via ORAL
  Filled 2013-01-15 (×5): qty 1

## 2013-01-15 MED ORDER — SODIUM CHLORIDE 0.9 % IV SOLN
INTRAVENOUS | Status: DC
  Administered 2013-01-15: 15:00:00 via INTRAVENOUS

## 2013-01-15 MED ORDER — DEXMEDETOMIDINE HCL IN NACL 200 MCG/50ML IV SOLN
INTRAVENOUS | Status: DC | PRN
  Administered 2013-01-15: 0.3 ug/kg/h via INTRAVENOUS

## 2013-01-15 MED ORDER — HEPARIN SODIUM (PORCINE) 1000 UNIT/ML IJ SOLN
INTRAMUSCULAR | Status: DC | PRN
  Administered 2013-01-15: 38000 [IU] via INTRAVENOUS

## 2013-01-15 MED ORDER — ASPIRIN EC 325 MG PO TBEC
325.0000 mg | DELAYED_RELEASE_TABLET | Freq: Every day | ORAL | Status: DC
  Administered 2013-01-16 – 2013-01-18 (×3): 325 mg via ORAL
  Filled 2013-01-15 (×3): qty 1

## 2013-01-15 MED ORDER — PROPOFOL 10 MG/ML IV BOLUS
INTRAVENOUS | Status: DC | PRN
  Administered 2013-01-15: 50 mg via INTRAVENOUS

## 2013-01-15 MED ORDER — BISACODYL 10 MG RE SUPP: 10.0000 mg | Freq: Every day | RECTAL | Status: DC

## 2013-01-15 MED ORDER — SODIUM CHLORIDE 0.9 % IV SOLN
10.0000 g | INTRAVENOUS | Status: DC | PRN
  Administered 2013-01-15: 5 g/h via INTRAVENOUS

## 2013-01-15 MED ORDER — METOPROLOL TARTRATE 1 MG/ML IV SOLN: 2.5000 mg | INTRAVENOUS | Status: DC | PRN

## 2013-01-15 MED ORDER — PANTOPRAZOLE SODIUM 40 MG PO TBEC
40.0000 mg | DELAYED_RELEASE_TABLET | Freq: Every day | ORAL | Status: DC
  Administered 2013-01-17: 40 mg via ORAL
  Filled 2013-01-15: qty 1

## 2013-01-15 MED ORDER — ASPIRIN 81 MG PO CHEW
324.0000 mg | CHEWABLE_TABLET | Freq: Every day | ORAL | Status: DC
  Filled 2013-01-15 (×2): qty 4

## 2013-01-15 MED ORDER — MAGNESIUM SULFATE 40 MG/ML IJ SOLN
4.0000 g | Freq: Once | INTRAMUSCULAR | Status: AC
  Administered 2013-01-15: 4 g via INTRAVENOUS
  Filled 2013-01-15: qty 100

## 2013-01-15 MED ORDER — LACTATED RINGERS IV SOLN
INTRAVENOUS | Status: DC
  Administered 2013-01-15: 15:00:00 via INTRAVENOUS

## 2013-01-15 MED ORDER — SODIUM CHLORIDE 0.9 % IJ SOLN
3.0000 mL | Freq: Two times a day (BID) | INTRAMUSCULAR | Status: DC
  Administered 2013-01-16 – 2013-01-18 (×4): 3 mL via INTRAVENOUS

## 2013-01-15 MED ORDER — ACETAMINOPHEN 160 MG/5ML PO SOLN
1000.0000 mg | Freq: Four times a day (QID) | ORAL | Status: DC
  Filled 2013-01-15: qty 40

## 2013-01-15 MED ORDER — SODIUM CHLORIDE 0.9 % IV SOLN
100.0000 [IU] | INTRAVENOUS | Status: DC | PRN
  Administered 2013-01-15: 1 [IU]/h via INTRAVENOUS

## 2013-01-15 MED ORDER — ALBUMIN HUMAN 5 % IV SOLN
250.0000 mL | INTRAVENOUS | Status: AC | PRN
  Administered 2013-01-15 – 2013-01-16 (×4): 250 mL via INTRAVENOUS
  Filled 2013-01-15: qty 250

## 2013-01-15 MED ORDER — SODIUM CHLORIDE 0.9 % IJ SOLN
OROMUCOSAL | Status: DC | PRN
  Administered 2013-01-15 (×3): via TOPICAL

## 2013-01-15 MED ORDER — SODIUM CHLORIDE 0.45 % IV SOLN
INTRAVENOUS | Status: DC
  Administered 2013-01-15: 15:00:00 via INTRAVENOUS

## 2013-01-15 MED ORDER — FAMOTIDINE IN NACL 20-0.9 MG/50ML-% IV SOLN
20.0000 mg | Freq: Two times a day (BID) | INTRAVENOUS | Status: AC
  Administered 2013-01-15: 20 mg via INTRAVENOUS
  Filled 2013-01-15: qty 50

## 2013-01-15 MED ORDER — DEXMEDETOMIDINE HCL IN NACL 200 MCG/50ML IV SOLN
0.1000 ug/kg/h | INTRAVENOUS | Status: DC
  Administered 2013-01-15: 0.7 ug/kg/h via INTRAVENOUS
  Filled 2013-01-15: qty 50

## 2013-01-15 MED ORDER — MORPHINE SULFATE 2 MG/ML IJ SOLN
1.0000 mg | INTRAMUSCULAR | Status: AC | PRN
  Administered 2013-01-15: 2 mg via INTRAVENOUS
  Administered 2013-01-15: 1 mg via INTRAVENOUS
  Filled 2013-01-15: qty 1

## 2013-01-15 MED ORDER — ACYCLOVIR 200 MG PO CAPS
400.0000 mg | ORAL_CAPSULE | Freq: Every day | ORAL | Status: DC
  Administered 2013-01-16 – 2013-01-18 (×2): 400 mg via ORAL
  Filled 2013-01-15 (×5): qty 2

## 2013-01-15 MED ORDER — DEXTROSE 5 % IV SOLN
1.5000 g | Freq: Two times a day (BID) | INTRAVENOUS | Status: AC
  Administered 2013-01-15 – 2013-01-17 (×4): 1.5 g via INTRAVENOUS
  Filled 2013-01-15 (×4): qty 1.5

## 2013-01-15 MED ORDER — NITROGLYCERIN IN D5W 200-5 MCG/ML-% IV SOLN
INTRAVENOUS | Status: DC | PRN
  Administered 2013-01-15: 5 ug/min via INTRAVENOUS

## 2013-01-15 MED ORDER — METOPROLOL TARTRATE 12.5 MG HALF TABLET
12.5000 mg | ORAL_TABLET | Freq: Two times a day (BID) | ORAL | Status: DC
  Filled 2013-01-15 (×5): qty 1

## 2013-01-15 MED ORDER — VECURONIUM BROMIDE 10 MG IV SOLR
INTRAVENOUS | Status: DC | PRN
  Administered 2013-01-15: 4 mg via INTRAVENOUS
  Administered 2013-01-15: 3 mg via INTRAVENOUS
  Administered 2013-01-15: 2 mg via INTRAVENOUS
  Administered 2013-01-15: 3 mg via INTRAVENOUS
  Administered 2013-01-15: 5 mg via INTRAVENOUS

## 2013-01-15 MED ORDER — ACETAMINOPHEN 650 MG RE SUPP
650.0000 mg | Freq: Once | RECTAL | Status: AC
  Administered 2013-01-15: 650 mg via RECTAL

## 2013-01-15 MED ORDER — MIDAZOLAM HCL 5 MG/5ML IJ SOLN
INTRAMUSCULAR | Status: DC | PRN
  Administered 2013-01-15: 2 mg via INTRAVENOUS
  Administered 2013-01-15: 3 mg via INTRAVENOUS
  Administered 2013-01-15: 2 mg via INTRAVENOUS
  Administered 2013-01-15: 1 mg via INTRAVENOUS
  Administered 2013-01-15 (×3): 2 mg via INTRAVENOUS

## 2013-01-15 MED ORDER — ALBUMIN HUMAN 5 % IV SOLN
INTRAVENOUS | Status: DC | PRN
  Administered 2013-01-15: 13:00:00 via INTRAVENOUS

## 2013-01-15 MED ORDER — OXYCODONE HCL 5 MG PO TABS
5.0000 mg | ORAL_TABLET | ORAL | Status: DC | PRN
  Administered 2013-01-16 – 2013-01-17 (×2): 10 mg via ORAL
  Filled 2013-01-15 (×3): qty 2

## 2013-01-15 MED ORDER — BISACODYL 5 MG PO TBEC
10.0000 mg | DELAYED_RELEASE_TABLET | Freq: Every day | ORAL | Status: DC
  Administered 2013-01-18: 10 mg via ORAL
  Filled 2013-01-15 (×3): qty 2

## 2013-01-15 MED ORDER — SODIUM CHLORIDE 0.9 % IJ SOLN: 3.0000 mL | INTRAMUSCULAR | Status: DC | PRN

## 2013-01-15 MED ORDER — VANCOMYCIN HCL IN DEXTROSE 1-5 GM/200ML-% IV SOLN
1000.0000 mg | Freq: Once | INTRAVENOUS | Status: AC
  Administered 2013-01-15: 1000 mg via INTRAVENOUS
  Filled 2013-01-15: qty 200

## 2013-01-15 MED ORDER — ARTIFICIAL TEARS OP OINT
TOPICAL_OINTMENT | OPHTHALMIC | Status: DC | PRN
  Administered 2013-01-15: 1 via OPHTHALMIC

## 2013-01-15 MED ORDER — HEMOSTATIC AGENTS (NO CHARGE) OPTIME
TOPICAL | Status: DC | PRN
  Administered 2013-01-15: 1 via TOPICAL

## 2013-01-15 MED ORDER — POTASSIUM CHLORIDE 10 MEQ/50ML IV SOLN
10.0000 meq | INTRAVENOUS | Status: AC
  Administered 2013-01-15 (×3): 10 meq via INTRAVENOUS

## 2013-01-15 MED ORDER — ACETAMINOPHEN 500 MG PO TABS
1000.0000 mg | ORAL_TABLET | Freq: Four times a day (QID) | ORAL | Status: DC
  Administered 2013-01-16 – 2013-01-18 (×10): 1000 mg via ORAL
  Filled 2013-01-15 (×14): qty 2

## 2013-01-15 MED ORDER — PROTAMINE SULFATE 10 MG/ML IV SOLN
INTRAVENOUS | Status: DC | PRN
  Administered 2013-01-15: 280 mg via INTRAVENOUS
  Administered 2013-01-15: 25 mg via INTRAVENOUS

## 2013-01-15 MED ORDER — SODIUM CHLORIDE 0.9 % IV SOLN
20.0000 mg | INTRAVENOUS | Status: DC | PRN
  Administered 2013-01-15: 10 ug/min via INTRAVENOUS

## 2013-01-15 MED ORDER — DOCUSATE SODIUM 100 MG PO CAPS
200.0000 mg | ORAL_CAPSULE | Freq: Every day | ORAL | Status: DC
Start: 2013-01-16 — End: 2013-01-18
  Administered 2013-01-18: 200 mg via ORAL
  Filled 2013-01-15 (×3): qty 2

## 2013-01-15 MED ORDER — ONDANSETRON HCL 4 MG/2ML IJ SOLN
4.0000 mg | Freq: Four times a day (QID) | INTRAMUSCULAR | Status: DC | PRN
  Administered 2013-01-15 – 2013-01-17 (×4): 4 mg via INTRAVENOUS
  Filled 2013-01-15 (×4): qty 2

## 2013-01-15 MED ORDER — SODIUM CHLORIDE 0.9 % IV SOLN: 250.0000 mL | INTRAVENOUS | Status: DC

## 2013-01-15 MED ORDER — PHENYLEPHRINE HCL 10 MG/ML IJ SOLN
0.0000 ug/min | INTRAMUSCULAR | Status: DC
  Administered 2013-01-15: 13.333 ug/min via INTRAVENOUS
  Administered 2013-01-16: 10 ug/min via INTRAVENOUS
  Filled 2013-01-15 (×3): qty 2

## 2013-01-15 MED ORDER — FENTANYL CITRATE 0.05 MG/ML IJ SOLN
INTRAMUSCULAR | Status: DC | PRN
  Administered 2013-01-15: 150 ug via INTRAVENOUS
  Administered 2013-01-15: 100 ug via INTRAVENOUS
  Administered 2013-01-15: 50 ug via INTRAVENOUS
  Administered 2013-01-15: 100 ug via INTRAVENOUS
  Administered 2013-01-15: 900 ug via INTRAVENOUS
  Administered 2013-01-15: 150 ug via INTRAVENOUS
  Administered 2013-01-15: 50 ug via INTRAVENOUS

## 2013-01-15 MED ORDER — TEMAZEPAM 15 MG PO CAPS
15.0000 mg | ORAL_CAPSULE | Freq: Every day | ORAL | Status: AC
  Administered 2013-01-15: 15 mg via ORAL

## 2013-01-15 MED ORDER — ACETAMINOPHEN 160 MG/5ML PO SOLN: 650.0000 mg | Freq: Once | ORAL | Status: AC

## 2013-01-15 MED ORDER — KETOROLAC TROMETHAMINE 15 MG/ML IJ SOLN
15.0000 mg | Freq: Four times a day (QID) | INTRAMUSCULAR | Status: AC
  Administered 2013-01-15 – 2013-01-17 (×7): 15 mg via INTRAVENOUS
  Filled 2013-01-15 (×10): qty 1

## 2013-01-15 MED ORDER — SODIUM CHLORIDE 0.9 % IV SOLN
INTRAVENOUS | Status: DC
  Filled 2013-01-15: qty 1

## 2013-01-15 MED ORDER — NITROGLYCERIN IN D5W 200-5 MCG/ML-% IV SOLN: 0.0000 ug/min | INTRAVENOUS | Status: DC

## 2013-01-15 MED ORDER — MORPHINE SULFATE 2 MG/ML IJ SOLN
2.0000 mg | INTRAMUSCULAR | Status: DC | PRN
  Administered 2013-01-16 (×3): 2 mg via INTRAVENOUS
  Filled 2013-01-15: qty 2
  Filled 2013-01-15: qty 1

## 2013-01-15 MED ORDER — DEXTROSE 5 % IV SOLN
1.5000 g | INTRAVENOUS | Status: DC | PRN
  Administered 2013-01-15: .75 g via INTRAVENOUS
  Administered 2013-01-15: 1.5 g via INTRAVENOUS

## 2013-01-15 MED ORDER — LACTATED RINGERS IV SOLN
INTRAVENOUS | Status: DC | PRN
  Administered 2013-01-15 (×3): via INTRAVENOUS

## 2013-01-15 MED ORDER — LACTATED RINGERS IV SOLN: 500.0000 mL | Freq: Once | INTRAVENOUS | Status: AC | PRN

## 2013-01-15 MED ORDER — METOPROLOL TARTRATE 25 MG/10 ML ORAL SUSPENSION
12.5000 mg | Freq: Two times a day (BID) | ORAL | Status: DC
Start: 2013-01-15 — End: 2013-01-17
  Administered 2013-01-16: 12.5 mg
  Filled 2013-01-15 (×5): qty 5

## 2013-01-15 SURGICAL SUPPLY — 78 items
ATTRACTOMAT 16X20 MAGNETIC DRP (DRAPES) ×3 IMPLANT
BAG DECANTER FOR FLEXI CONT (MISCELLANEOUS) ×3 IMPLANT
BANDAGE ELASTIC 4 VELCRO ST LF (GAUZE/BANDAGES/DRESSINGS) ×3 IMPLANT
BANDAGE ELASTIC 6 VELCRO ST LF (GAUZE/BANDAGES/DRESSINGS) ×3 IMPLANT
BANDAGE GAUZE ELAST BULKY 4 IN (GAUZE/BANDAGES/DRESSINGS) ×3 IMPLANT
BLADE STERNUM SYSTEM 6 (BLADE) ×3 IMPLANT
BUR CROSS CUT FISSURE 1.6 (BURR) IMPLANT
CANISTER SUCTION 2500CC (MISCELLANEOUS) ×3 IMPLANT
CANN PRFSN .5XCNCT 15X34-48 (MISCELLANEOUS) ×2
CANNULA AORTIC HI-FLOW 6.5M20F (CANNULA) ×3 IMPLANT
CANNULA PRFSN .5XCNCT 15X34-48 (MISCELLANEOUS) ×2 IMPLANT
CANNULA VEN 2 STAGE (MISCELLANEOUS) ×3
CANNULA VESSEL 3MM 2 BLNT TIP (CANNULA) ×2 IMPLANT
CATH CPB KIT GERHARDT (MISCELLANEOUS) ×3 IMPLANT
CATH THORACIC 28FR (CATHETERS) ×3 IMPLANT
COVER SURGICAL LIGHT HANDLE (MISCELLANEOUS) ×3 IMPLANT
CRADLE DONUT ADULT HEAD (MISCELLANEOUS) ×3 IMPLANT
DRAIN CHANNEL 28F RND 3/8 FF (WOUND CARE) ×3 IMPLANT
DRAPE CARDIOVASCULAR INCISE (DRAPES) ×3
DRAPE SLUSH/WARMER DISC (DRAPES) ×3 IMPLANT
DRAPE SRG 135X102X78XABS (DRAPES) ×2 IMPLANT
DRSG AQUACEL AG ADV 3.5X14 (GAUZE/BANDAGES/DRESSINGS) ×3 IMPLANT
ELECT BLADE 4.0 EZ CLEAN MEGAD (MISCELLANEOUS) ×3
ELECT CAUTERY BLADE 6.4 (BLADE) ×2 IMPLANT
ELECT REM PT RETURN 9FT ADLT (ELECTROSURGICAL) ×6
ELECTRODE BLDE 4.0 EZ CLN MEGD (MISCELLANEOUS) ×2 IMPLANT
ELECTRODE REM PT RTRN 9FT ADLT (ELECTROSURGICAL) ×4 IMPLANT
GLOVE BIO SURGEON STRL SZ 6.5 (GLOVE) ×15 IMPLANT
GLOVE BIO SURGEON STRL SZ7 (GLOVE) ×2 IMPLANT
GLOVE BIO SURGEON STRL SZ7.5 (GLOVE) ×2 IMPLANT
GLOVE BIOGEL PI IND STRL 6 (GLOVE) IMPLANT
GLOVE BIOGEL PI IND STRL 6.5 (GLOVE) IMPLANT
GLOVE BIOGEL PI IND STRL 7.0 (GLOVE) IMPLANT
GLOVE BIOGEL PI INDICATOR 6 (GLOVE) ×2
GLOVE BIOGEL PI INDICATOR 6.5 (GLOVE) ×2
GLOVE BIOGEL PI INDICATOR 7.0 (GLOVE) ×1
GOWN EXTRA PROTECTION XL (GOWNS) ×1 IMPLANT
GOWN STRL NON-REIN LRG LVL3 (GOWN DISPOSABLE) ×16 IMPLANT
HEMOSTAT POWDER SURGIFOAM 1G (HEMOSTASIS) ×9 IMPLANT
HEMOSTAT SURGICEL 2X14 (HEMOSTASIS) ×3 IMPLANT
KIT BASIN OR (CUSTOM PROCEDURE TRAY) ×3 IMPLANT
KIT ROOM TURNOVER OR (KITS) ×3 IMPLANT
KIT SUCTION CATH 14FR (SUCTIONS) ×6 IMPLANT
KIT VASOVIEW W/TROCAR VH 2000 (KITS) ×3 IMPLANT
LEAD PACING MYOCARDI (MISCELLANEOUS) ×3 IMPLANT
MARKER GRAFT CORONARY BYPASS (MISCELLANEOUS) ×9 IMPLANT
NS IRRIG 1000ML POUR BTL (IV SOLUTION) ×17 IMPLANT
PACK OPEN HEART (CUSTOM PROCEDURE TRAY) ×3 IMPLANT
PAD ARMBOARD 7.5X6 YLW CONV (MISCELLANEOUS) ×6 IMPLANT
PAD ELECT DEFIB RADIOL ZOLL (MISCELLANEOUS) ×3 IMPLANT
PENCIL BUTTON HOLSTER BLD 10FT (ELECTRODE) ×3 IMPLANT
PUNCH AORTIC ROT 4.0MM RCL 40 (MISCELLANEOUS) ×2 IMPLANT
SET CARDIOPLEGIA MPS 5001102 (MISCELLANEOUS) ×1 IMPLANT
SPONGE GAUZE 4X4 12PLY (GAUZE/BANDAGES/DRESSINGS) ×6 IMPLANT
SPONGE LAP 18X18 X RAY DECT (DISPOSABLE) ×5 IMPLANT
SUT BONE WAX W31G (SUTURE) ×3 IMPLANT
SUT PROLENE 3 0 SH1 36 (SUTURE) ×3 IMPLANT
SUT PROLENE 4 0 TF (SUTURE) ×6 IMPLANT
SUT PROLENE 6 0 CC (SUTURE) ×8 IMPLANT
SUT PROLENE 7 0 BV1 MDA (SUTURE) ×7 IMPLANT
SUT PROLENE 7.0 RB 3 (SUTURE) ×4 IMPLANT
SUT PROLENE 8 0 BV175 6 (SUTURE) ×10 IMPLANT
SUT STEEL 6MS V (SUTURE) ×5 IMPLANT
SUT STEEL SZ 6 DBL 3X14 BALL (SUTURE) ×3 IMPLANT
SUT VIC AB 1 CTX 18 (SUTURE) ×6 IMPLANT
SUT VIC AB 2-0 CT1 27 (SUTURE) ×3
SUT VIC AB 2-0 CT1 TAPERPNT 27 (SUTURE) IMPLANT
SUT VIC AB 3-0 X1 27 (SUTURE) ×2 IMPLANT
SUTURE E-PAK OPEN HEART (SUTURE) ×3 IMPLANT
SYSTEM SAHARA CHEST DRAIN ATS (WOUND CARE) ×3 IMPLANT
TAPE CLOTH SURG 4X10 WHT LF (GAUZE/BANDAGES/DRESSINGS) ×1 IMPLANT
TOWEL OR 17X24 6PK STRL BLUE (TOWEL DISPOSABLE) ×6 IMPLANT
TOWEL OR 17X26 10 PK STRL BLUE (TOWEL DISPOSABLE) ×6 IMPLANT
TRAY FOLEY IC TEMP SENS 14FR (CATHETERS) ×3 IMPLANT
TUBE FEEDING 8FR 16IN STR KANG (MISCELLANEOUS) ×3 IMPLANT
TUBING INSUFFLATION 10FT LAP (TUBING) ×3 IMPLANT
UNDERPAD 30X30 INCONTINENT (UNDERPADS AND DIAPERS) ×3 IMPLANT
WATER STERILE IRR 1000ML POUR (IV SOLUTION) ×6 IMPLANT

## 2013-01-15 NOTE — Anesthesia Preprocedure Evaluation (Addendum)
Anesthesia Evaluation  Patient identified by MRN, date of birth, ID band Patient awake    Reviewed: Allergy & Precautions, H&P , NPO status , Patient's Chart, lab work & pertinent test results  Airway Mallampati: I TM Distance: >3 FB Neck ROM: Full    Dental no notable dental hx. (+) Teeth Intact and Dental Advisory Given   Pulmonary neg pulmonary ROS,  breath sounds clear to auscultation  Pulmonary exam normal       Cardiovascular + CAD and + Past MI negative cardio ROS  Rhythm:Regular Rate:Normal     Neuro/Psych negative neurological ROS  negative psych ROS   GI/Hepatic Neg liver ROS, GERD-  Medicated and Controlled,  Endo/Other  negative endocrine ROS  Renal/GU negative Renal ROS  negative genitourinary   Musculoskeletal   Abdominal   Peds  Hematology negative hematology ROS (+)   Anesthesia Other Findings   Reproductive/Obstetrics negative OB ROS                         Anesthesia Physical Anesthesia Plan  ASA: IV  Anesthesia Plan: General   Post-op Pain Management:    Induction: Intravenous  Airway Management Planned: Oral ETT  Additional Equipment: Arterial line, CVP, PA Cath, TEE and Ultrasound Guidance Line Placement  Intra-op Plan:   Post-operative Plan: Post-operative intubation/ventilation  Informed Consent: I have reviewed the patients History and Physical, chart, labs and discussed the procedure including the risks, benefits and alternatives for the proposed anesthesia with the patient or authorized representative who has indicated his/her understanding and acceptance.   Dental advisory given  Plan Discussed with: CRNA  Anesthesia Plan Comments:         Anesthesia Quick Evaluation

## 2013-01-15 NOTE — OR Nursing (Signed)
1324 first call made to sicu, 1400 second call made to sicu

## 2013-01-15 NOTE — Procedures (Signed)
Extubation Procedure Note  Patient Details:   Name: Andrew Sampson DOB: May 12, 1960 MRN: [id number]   Airway Documentation:   NIF -60, VC 1.3 L, + air leak around cuff  Evaluation  O2 sats: stable throughout Complications: No apparent complications Patient did tolerate procedure well. Bilateral Breath Sounds: Clear   Yes, pt able to speak.  No stridor noted.  NO distress noted, placed pt on 4 lpm [place]  Jennette Kettle 01/15/2013, 5:57 PM

## 2013-01-15 NOTE — Progress Notes (Signed)
SICU vent wean protocol initiated.   

## 2013-01-15 NOTE — Progress Notes (Signed)
T. CTS p.m. Rounds  Extubated early following multivessel CABG Hemodynamic stable Not bleeding Postop labs reviewed Complaining of musculoskeletal pain, nauseated with morphine, will add low-dose Toradol

## 2013-01-15 NOTE — Transfer of Care (Signed)
Immediate Anesthesia Transfer of Care Note  Patient: Andrew Sampson  Procedure(s) Performed: Procedure(s): CORONARY ARTERY BYPASS GRAFTING (CABG) TIMES FOUR USING LEFT INTERNAL MAMMARY ARTERY AND RIGHT SAPHENOUS LEG VEIN HARVESTED ENDOSCOPICALLY (N/A) INTRAOPERATIVE TRANSESOPHAGEAL ECHOCARDIOGRAM (N/A)  Patient Location: SICU  Anesthesia Type:General  Level of Consciousness: sedated, unresponsive and Patient remains intubated per anesthesia plan  Airway & Oxygen Therapy: Patient remains intubated per anesthesia plan and Patient placed on Ventilator (see vital sign flow sheet for setting)  Post-op Assessment: Report given to PACU RN and Post -op Vital signs reviewed and stable  Post vital signs: Reviewed and stable  Complications: No apparent anesthesia complications

## 2013-01-15 NOTE — Progress Notes (Signed)
Echocardiogram Echocardiogram Transesophageal has been performed.  Andrew Sampson 01/15/2013, 9:28 AM

## 2013-01-15 NOTE — Preoperative (Signed)
Beta Blockers   Reason not to administer Beta Blockers:Not Applicable 

## 2013-01-15 NOTE — Anesthesia Postprocedure Evaluation (Signed)
  Anesthesia Post-op Note  Patient: Andrew Sampson  Procedure(s) Performed: Procedure(s): CORONARY ARTERY BYPASS GRAFTING (CABG) TIMES FOUR USING LEFT INTERNAL MAMMARY ARTERY AND RIGHT SAPHENOUS LEG VEIN HARVESTED ENDOSCOPICALLY (N/A) INTRAOPERATIVE TRANSESOPHAGEAL ECHOCARDIOGRAM (N/A)  Patient Location: ICU  Anesthesia Type:General  Level of Consciousness: sedated and unresponsive  Airway and Oxygen Therapy: Patient remains intubated and on ventilator  Post-op Pain: none  Post-op Assessment: Post-op Vital signs reviewed, Patient's Cardiovascular Status Stable and Respiratory Function Stable  Post-op Vital Signs: Reviewed  Filed Vitals:   01/15/13 1500  BP: 102/73  Pulse: 79  Temp: 35.6 C  Resp: 14    Complications: No apparent anesthesia complications

## 2013-01-15 NOTE — Brief Op Note (Signed)
      [address]       [place] [address]             [phone]     01/10/2013 - 01/15/2013  12:15 PM  PATIENT:  [name]  52 y.o. male  PRE-OPERATIVE DIAGNOSIS:  Coronary Artery Disease  POST-OPERATIVE DIAGNOSIS:  Coronary Artery Disease  PROCEDURE:  Procedure(s): CORONARY ARTERY BYPASS GRAFTING (CABG) X4 LIMA-LAD; SVG-DIAG; SVG-OM; SVG-RCA INTRAOPERATIVE TRANSESOPHAGEAL ECHOCARDIOGRAM [facility] RIGHT THIGH  SURGEON:  Surgeon(s): [name], MD  PHYSICIAN ASSISTANT: Faren Florence PA-C  ANESTHESIA:   general  PATIENT CONDITION:  ICU - intubated and hemodynamically stable.  PRE-OPERATIVE WEIGHT: 75kg  COMPLICATIONS: NO KNOWN

## 2013-01-16 ENCOUNTER — Encounter (HOSPITAL_COMMUNITY): Payer: Self-pay | Admitting: Cardiothoracic Surgery

## 2013-01-16 ENCOUNTER — Inpatient Hospital Stay (HOSPITAL_COMMUNITY): Payer: BC Managed Care – PPO

## 2013-01-16 LAB — POCT I-STAT, CHEM 8
BUN: 12 mg/dL (ref 6–23)
Calcium, Ion: 1.18 mmol/L (ref 1.12–1.23)
Creatinine, Ser: 1.1 mg/dL (ref 0.50–1.35)
Glucose, Bld: 117 mg/dL — ABNORMAL HIGH (ref 70–99)
Hemoglobin: 8.8 g/dL — ABNORMAL LOW (ref 13.0–17.0)
Sodium: 141 mEq/L (ref 135–145)
TCO2: 23 mmol/L (ref 0–100)

## 2013-01-16 LAB — CBC
HCT: 26 % — ABNORMAL LOW (ref 39.0–52.0)
Hemoglobin: 8.8 g/dL — ABNORMAL LOW (ref 13.0–17.0)
MCH: 30.3 pg (ref 26.0–34.0)
MCV: 89.7 fL (ref 78.0–100.0)
Platelets: 158 10*3/uL (ref 150–400)
RBC: 2.9 MIL/uL — ABNORMAL LOW (ref 4.22–5.81)
RBC: 2.86 MIL/uL — ABNORMAL LOW (ref 4.22–5.81)
WBC: 9.5 10*3/uL (ref 4.0–10.5)

## 2013-01-16 LAB — MAGNESIUM: Magnesium: 2.2 mg/dL (ref 1.5–2.5)

## 2013-01-16 LAB — BASIC METABOLIC PANEL
BUN: 12 mg/dL (ref 6–23)
CO2: 24 mEq/L (ref 19–32)
Calcium: 7.8 mg/dL — ABNORMAL LOW (ref 8.4–10.5)
Chloride: 104 mEq/L (ref 96–112)
Creatinine, Ser: 1.09 mg/dL (ref 0.50–1.35)
GFR calc non Af Amer: 75 mL/min — ABNORMAL LOW (ref >90)
Glucose, Bld: 104 mg/dL — ABNORMAL HIGH (ref 70–99)

## 2013-01-16 LAB — GLUCOSE, CAPILLARY
Glucose-Capillary: 106 mg/dL — ABNORMAL HIGH (ref 70–99)
Glucose-Capillary: 98 mg/dL (ref 70–99)
Glucose-Capillary: 113 mg/dL — ABNORMAL HIGH (ref 70–99)
Glucose-Capillary: 145 mg/dL — ABNORMAL HIGH (ref 70–99)

## 2013-01-16 LAB — CREATININE, SERUM
Creatinine, Ser: 1.07 mg/dL (ref 0.50–1.35)
GFR calc Af Amer: 89 mL/min — ABNORMAL LOW (ref >90)
GFR calc non Af Amer: 77 mL/min — ABNORMAL LOW (ref >90)

## 2013-01-16 MED ORDER — INSULIN ASPART 100 UNIT/ML ~~LOC~~ SOLN: 0.0000 [IU] | SUBCUTANEOUS | Status: DC

## 2013-01-16 MED ORDER — INSULIN ASPART 100 UNIT/ML ~~LOC~~ SOLN
0.0000 [IU] | SUBCUTANEOUS | Status: DC
  Administered 2013-01-16: 2 [IU] via SUBCUTANEOUS
  Administered 2013-01-17: 4 [IU] via SUBCUTANEOUS
  Administered 2013-01-18: 2 [IU] via SUBCUTANEOUS

## 2013-01-16 MED ORDER — CLONAZEPAM 0.5 MG PO TABS
0.5000 mg | ORAL_TABLET | Freq: Every day | ORAL | Status: DC
  Administered 2013-01-16 – 2013-01-17 (×2): 0.5 mg via ORAL
  Filled 2013-01-16 (×2): qty 1

## 2013-01-16 MED FILL — Mannitol IV Soln 20%: INTRAVENOUS | Qty: 500 | Status: AC

## 2013-01-16 MED FILL — Lidocaine HCl IV Inj 20 MG/ML: INTRAVENOUS | Qty: 5 | Status: AC

## 2013-01-16 MED FILL — Sodium Bicarbonate IV Soln 8.4%: INTRAVENOUS | Qty: 50 | Status: AC

## 2013-01-16 MED FILL — Heparin Sodium (Porcine) Inj 1000 Unit/ML: INTRAMUSCULAR | Qty: 30 | Status: AC

## 2013-01-16 MED FILL — Electrolyte-R (PH 7.4) Solution: INTRAVENOUS | Qty: 5000 | Status: AC

## 2013-01-16 MED FILL — Heparin Sodium (Porcine) Inj 1000 Unit/ML: INTRAMUSCULAR | Qty: 20 | Status: AC

## 2013-01-16 MED FILL — Sodium Chloride IV Soln 0.9%: INTRAVENOUS | Qty: 2000 | Status: AC

## 2013-01-16 NOTE — Progress Notes (Signed)
VS stable and progressing. No new suggestions.

## 2013-01-16 NOTE — Progress Notes (Signed)
Patient ID: Andrew Sampson, male   DOB: [birth year], 52 y.o.   MRN: [id number] EVENING ROUNDS NOTE :     [address]       [place] [address]             [phone]                 1 Day Post-Op Procedure(s) (LRB): CORONARY ARTERY BYPASS GRAFTING (CABG) TIMES FOUR USING LEFT INTERNAL MAMMARY ARTERY AND RIGHT SAPHENOUS LEG VEIN HARVESTED ENDOSCOPICALLY (N/A) INTRAOPERATIVE TRANSESOPHAGEAL ECHOCARDIOGRAM (N/A)  Total Length of Stay:  LOS: 6 days  BP 103/66  Pulse 98  Temp(Src) 99 F (37.2 C) (Oral)  Resp 21  Ht 5\' 11"  (1.803 m)  Wt 178 lb 5.6 oz (80.9 kg)  BMI 24.89 kg/m2  SpO2 96%  .Intake/Output     12/18 0701 - 12/19 0700   I.V. (mL/kg) 574.2 (7.1)   Blood    IV Piggyback 50   Total Intake(mL/kg) 624.2 (7.7)   Urine (mL/kg/hr) 810 (0.7)   Blood    Chest Tube 20 (0)   Total Output 830   Net -205.8         . sodium chloride 20 mL/hr at 01/15/13 1430  . sodium chloride 20 mL/hr at 01/15/13 1430  . sodium chloride    . dexmedetomidine 0.1 mcg/kg/hr (01/15/13 1755)  . insulin (NOVOLIN-R) infusion 0.5 mL/hr at 01/16/13 0100  . lactated ringers 20 mL/hr at 01/15/13 1430  . nitroGLYCERIN 0 mcg/min (01/15/13 1430)  . phenylephrine (NEO-SYNEPHRINE) Adult infusion 12 mcg/min (01/16/13 2100)     Lab Results  Component Value Date   WBC 9.7 01/16/2013   HGB 8.8* 01/16/2013   HCT 26.0* 01/16/2013   PLT 165 01/16/2013   GLUCOSE 117* 01/16/2013   CHOL 273* 01/11/2013   TRIG 209* 01/11/2013   HDL 17* 01/11/2013   LDLCALC 214* 01/11/2013   ALT 47 01/14/2013   AST 36 01/14/2013   NA 141 01/16/2013   K 3.8 01/16/2013   CL 101 01/16/2013   CREATININE 1.10 01/16/2013   BUN 12 01/16/2013   CO2 24 01/16/2013   TSH 0.729 01/10/2013   INR 1.37 01/15/2013   HGBA1C 5.7* 01/15/2013   Sinus rhythm, walked in unit   [name] MD  Beeper [phone] Office [phone] 01/16/2013 9:28 PM

## 2013-01-16 NOTE — Progress Notes (Signed)
Patient ID: Andrew Sampson, male   DOB: 06/26/1960, 52 y.o.   MRN: [id number] TCTS DAILY ICU PROGRESS NOTE                   [address]            [place] [address]          [phone]   1 Day Post-Op Procedure(s) (LRB): CORONARY ARTERY BYPASS GRAFTING (CABG) TIMES FOUR USING LEFT INTERNAL MAMMARY ARTERY AND RIGHT SAPHENOUS LEG VEIN HARVESTED ENDOSCOPICALLY (N/A) INTRAOPERATIVE TRANSESOPHAGEAL ECHOCARDIOGRAM (N/A)  Total Length of Stay:  LOS: 6 days   Subjective: Awake and neuro intact,good pain control  Objective: Vital signs in last 24 hours: Temp:  [95.9 F (35.5 C)-100.8 F (38.2 C)] 99.1 F (37.3 C) (12/18 0715) Pulse Rate:  [76-90] 90 (12/18 0715) Cardiac Rhythm:  [-] Atrial paced (12/18 0700) Resp:  [10-45] 16 (12/18 0715) BP: (79-114)/(49-77) 85/51 mmHg (12/18 0700) SpO2:  [97 %-100 %] 99 % (12/18 0715) Arterial Line BP: (73-134)/(47-90) 90/57 mmHg (12/18 0715) FiO2 (%):  [40 %-50 %] 40 % (12/17 1710) Weight:  [178 lb 5.6 oz (80.9 kg)] 178 lb 5.6 oz (80.9 kg) (12/18 0500)  Filed Weights   01/10/13 2125 01/16/13 0500  Weight: 166 lb 14.2 oz (75.7 kg) 178 lb 5.6 oz (80.9 kg)    Weight change:    Hemodynamic parameters for last 24 hours: PAP: (14-45)/(5-26) 33/18 mmHg CO:  [3.4 L/min-6.1 L/min] 6.1 L/min CI:  [1.7 L/min/m2-3.1 L/min/m2] 3.1 L/min/m2  Intake/Output from previous day: 12/17 0701 - 12/18 0700 In: 4368.7 [I.V.:2891.7; Blood:467; IV Piggyback:1010] Out: 4330 [Urine:3090; Blood:800; Chest Tube:440]  Intake/Output this shift:    Current Meds: Scheduled Meds: . acetaminophen  1,000 mg Oral Q6H   Or  . acetaminophen (TYLENOL) oral liquid 160 mg/5 mL  1,000 mg Per Tube Q6H  . acyclovir  400 mg Oral QHS  . aspirin EC  325 mg Oral Daily   Or  . aspirin  324 mg Per Tube Daily  . atorvastatin  40 mg Oral q1800  . bisacodyl  10 mg Oral Daily   Or  . bisacodyl  10 mg Rectal Daily  . cefUROXime (ZINACEF)  IV  1.5 g Intravenous  Q12H  . docusate sodium  200 mg Oral Daily  . famotidine (PEPCID) IV  20 mg Intravenous Q12H  . insulin aspart  0-24 Units Subcutaneous Q4H  . insulin regular  0-10 Units Intravenous TID WC  . ketorolac  15 mg Intravenous Q6H  . metoprolol tartrate  12.5 mg Oral BID   Or  . metoprolol tartrate  12.5 mg Per Tube BID  . [START ON 01/17/2013] pantoprazole  40 mg Oral Daily  . sodium chloride  3 mL Intravenous Q12H   Continuous Infusions: . sodium chloride 20 mL/hr at 01/15/13 1430  . sodium chloride 20 mL/hr at 01/15/13 1430  . sodium chloride    . dexmedetomidine 0.1 mcg/kg/hr (01/15/13 1755)  . insulin (NOVOLIN-R) infusion 0.5 mL/hr at 01/16/13 0100  . lactated ringers 20 mL/hr at 01/15/13 1430  . nitroGLYCERIN 0 mcg/min (01/15/13 1430)  . phenylephrine (NEO-SYNEPHRINE) Adult infusion 30 mcg/min (01/16/13 0700)   PRN Meds:.metoprolol, midazolam, morphine injection, ondansetron (ZOFRAN) IV, oxyCODONE, sodium chloride  General appearance: alert, cooperative and no distress Neurologic: intact Heart: regular rate and rhythm, S1, S2 normal, no murmur, click, rub or gallop Lungs: clear to auscultation bilaterally Abdomen: soft, non-tender; bowel sounds normal; no masses,  no  organomegaly Extremities: extremities normal, atraumatic, no cyanosis or edema and Homans sign is negative, no sign of DVT Wound: sternum stable  Lab Results: CBC: Recent Labs  01/15/13 2000 01/15/13 2016 01/16/13 0400  WBC 11.8*  --  9.5  HGB 10.1* 10.5* 8.8*  HCT 29.7* 31.0* 26.0*  PLT 162  --  158   BMET:  Recent Labs  01/15/13 0615  01/15/13 2016 01/16/13 0400  NA 140  < > 140 138  K 4.0  < > 4.3 4.0  CL 103  --  103 104  CO2 25  --   --  24  GLUCOSE 103*  < > 137* 104*  BUN 14  --  11 12  CREATININE 1.25  < > 1.10 1.09  CALCIUM 9.3  --   --  7.8*  < > = values in this interval not displayed.  PT/INR:  Recent Labs  01/15/13 1435  LABPROT 16.5*  INR 1.37   Radiology: Dg Chest Portable  1 View In Am  01/16/2013   CLINICAL DATA:  Status post coronary bypass grafting  EXAM: PORTABLE CHEST - 1 VIEW  COMPARISON:  01/15/2013  FINDINGS: Swan-Ganz catheter is again noted within the right pulmonary artery. A left-sided thoracostomy catheter and mediastinal drain remain. The nasogastric catheter and endotracheal tube have been removed. Minimal platelike atelectasis remains in the right lung base. Some new atelectatic changes are noted laterally in the left lung base.  IMPRESSION: New left basilar atelectasis.  The remainder of the exam is stable.   Electronically Signed   By: Alcide Clever M.D.   On: 01/16/2013 07:52   Dg Chest Portable 1 View  01/15/2013   CLINICAL DATA:  Status post coronary bypass grafting  EXAM: PORTABLE CHEST - 1 VIEW  COMPARISON:  01/10/2013  FINDINGS: The cardiac shadow is stable. Postsurgical changes are now seen. A Swan-Ganz catheter is noted within the right pulmonary artery. A an endotracheal tube is seen 6 cm above the carina. A nasogastric catheter is coiled within the stomach. Mediastinal drain left-sided thoracostomy catheter are noted. No pneumothorax is seen. Minimal right basilar atelectasis is noted.  IMPRESSION: Support apparatus as described.  Minimal right basilar atelectasis.   Electronically Signed   By: Alcide Clever M.D.   On: 01/15/2013 15:12     Assessment/Plan: S/P Procedure(s) (LRB): CORONARY ARTERY BYPASS GRAFTING (CABG) TIMES FOUR USING LEFT INTERNAL MAMMARY ARTERY AND RIGHT SAPHENOUS LEG VEIN HARVESTED ENDOSCOPICALLY (N/A) INTRAOPERATIVE TRANSESOPHAGEAL ECHOCARDIOGRAM (N/A) Mobilize Diuresis d/c tubes/lines Continue foley due to diuresing patient, strict I&O, patient in ICU and urinary output monitoring See progression orders Expected Acute  Blood - loss Anemia    [name] B 01/16/2013 8:18 AM

## 2013-01-17 ENCOUNTER — Inpatient Hospital Stay (HOSPITAL_COMMUNITY): Payer: BC Managed Care – PPO

## 2013-01-17 DIAGNOSIS — Z951 Presence of aortocoronary bypass graft: Secondary | ICD-10-CM

## 2013-01-17 LAB — BASIC METABOLIC PANEL
BUN: 15 mg/dL (ref 6–23)
CO2: 28 mEq/L (ref 19–32)
Calcium: 7.7 mg/dL — ABNORMAL LOW (ref 8.4–10.5)
Chloride: 107 mEq/L (ref 96–112)
Creatinine, Ser: 1.18 mg/dL (ref 0.50–1.35)
GFR calc Af Amer: 79 mL/min — ABNORMAL LOW (ref >90)
GFR calc non Af Amer: 68 mL/min — ABNORMAL LOW (ref >90)
Glucose, Bld: 115 mg/dL — ABNORMAL HIGH (ref 70–99)
Potassium: 4 mEq/L (ref 3.5–5.1)
Sodium: 142 mEq/L (ref 135–145)

## 2013-01-17 LAB — CBC
HCT: 23.1 % — ABNORMAL LOW (ref 39.0–52.0)
Hemoglobin: 7.8 g/dL — ABNORMAL LOW (ref 13.0–17.0)
MCH: 30.7 pg (ref 26.0–34.0)
MCHC: 33.8 g/dL (ref 30.0–36.0)
MCV: 90.9 fL (ref 78.0–100.0)
Platelets: 139 10*3/uL — ABNORMAL LOW (ref 150–400)
RBC: 2.54 MIL/uL — ABNORMAL LOW (ref 4.22–5.81)
RDW: 13.1 % (ref 11.5–15.5)
WBC: 8.7 10*3/uL (ref 4.0–10.5)

## 2013-01-17 LAB — GLUCOSE, CAPILLARY
Glucose-Capillary: 105 mg/dL — ABNORMAL HIGH (ref 70–99)
Glucose-Capillary: 91 mg/dL (ref 70–99)
Glucose-Capillary: 108 mg/dL — ABNORMAL HIGH (ref 70–99)
Glucose-Capillary: 108 mg/dL — ABNORMAL HIGH (ref 70–99)
Glucose-Capillary: 118 mg/dL — ABNORMAL HIGH (ref 70–99)
Glucose-Capillary: 82 mg/dL (ref 70–99)

## 2013-01-17 MED ORDER — NON FORMULARY: 40.0000 mg | Freq: Every day | Status: DC

## 2013-01-17 MED ORDER — POTASSIUM CHLORIDE CRYS ER 20 MEQ PO TBCR
20.0000 meq | EXTENDED_RELEASE_TABLET | Freq: Once | ORAL | Status: AC
  Administered 2013-01-17: 20 meq via ORAL
  Filled 2013-01-17: qty 1

## 2013-01-17 MED ORDER — CALCIUM CARBONATE ANTACID 500 MG PO CHEW
1.0000 | CHEWABLE_TABLET | Freq: Every day | ORAL | Status: DC
  Filled 2013-01-17: qty 1

## 2013-01-17 MED ORDER — METOPROLOL TARTRATE 25 MG/10 ML ORAL SUSPENSION
12.5000 mg | Freq: Two times a day (BID) | ORAL | Status: DC
  Filled 2013-01-17 (×3): qty 5

## 2013-01-17 MED ORDER — ALUM & MAG HYDROXIDE-SIMETH 200-200-20 MG/5ML PO SUSP
15.0000 mL | Freq: Once | ORAL | Status: AC
  Administered 2013-01-17: 15 mL via ORAL
  Filled 2013-01-17: qty 30

## 2013-01-17 MED ORDER — METOPROLOL TARTRATE 12.5 MG HALF TABLET
12.5000 mg | ORAL_TABLET | Freq: Two times a day (BID) | ORAL | Status: DC
  Administered 2013-01-18: 12.5 mg via ORAL
  Filled 2013-01-17 (×3): qty 1

## 2013-01-17 MED ORDER — TRAMADOL HCL 50 MG PO TABS
100.0000 mg | ORAL_TABLET | Freq: Four times a day (QID) | ORAL | Status: DC | PRN
  Administered 2013-01-17 – 2013-01-19 (×3): 100 mg via ORAL
  Administered 2013-01-19 (×2): 50 mg via ORAL
  Filled 2013-01-17 (×5): qty 2

## 2013-01-17 MED ORDER — ESOMEPRAZOLE MAGNESIUM 40 MG PO CPDR
40.0000 mg | DELAYED_RELEASE_CAPSULE | Freq: Every day | ORAL | Status: DC
  Administered 2013-01-17: 40 mg via ORAL
  Filled 2013-01-17 (×2): qty 1

## 2013-01-17 MED ORDER — FUROSEMIDE 10 MG/ML IJ SOLN: 20.0000 mg | Freq: Two times a day (BID) | INTRAMUSCULAR | Status: DC

## 2013-01-17 MED ORDER — FOLIC ACID 1 MG PO TABS
1.0000 mg | ORAL_TABLET | Freq: Every day | ORAL | Status: DC
  Administered 2013-01-17 – 2013-01-20 (×4): 1 mg via ORAL
  Filled 2013-01-17 (×4): qty 1

## 2013-01-17 MED ORDER — FERROUS SULFATE 325 (65 FE) MG PO TABS
325.0000 mg | ORAL_TABLET | Freq: Every day | ORAL | Status: DC
  Administered 2013-01-17 – 2013-01-20 (×4): 325 mg via ORAL
  Filled 2013-01-17 (×5): qty 1

## 2013-01-17 MED ORDER — ALUM & MAG HYDROXIDE-SIMETH 200-200-20 MG/5ML PO SUSP
15.0000 mL | ORAL | Status: DC | PRN
  Administered 2013-01-18: 15 mL via ORAL
  Filled 2013-01-17: qty 30

## 2013-01-17 NOTE — Progress Notes (Addendum)
TCTS DAILY ICU PROGRESS NOTE                   [address]            [place] [address]          [phone]   2 Days Post-Op Procedure(s) (LRB): CORONARY ARTERY BYPASS GRAFTING (CABG) TIMES FOUR USING LEFT INTERNAL MAMMARY ARTERY AND RIGHT SAPHENOUS LEG VEIN HARVESTED ENDOSCOPICALLY (N/A) INTRAOPERATIVE TRANSESOPHAGEAL ECHOCARDIOGRAM (N/A)  Total Length of Stay:  LOS: 7 days   Subjective: Patient states he felt his heart racing last evening, he has a lot of gas, and has had a headache posterior left side of head since surgery. Is ambulating a couple times per day  Objective: Vital signs in last 24 hours: Temp:  [98.6 F (37 C)-99.6 F (37.6 C)] 99.6 F (37.6 C) (12/19 0400) Pulse Rate:  [75-112] 90 (12/19 0645) Cardiac Rhythm:  [-] Normal sinus rhythm (12/19 0400) Resp:  [12-30] 26 (12/19 0645) BP: (83-111)/(53-68) 101/56 mmHg (12/19 0400) SpO2:  [94 %-100 %] 96 % (12/19 0645) Arterial Line BP: (68-146)/(39-79) 123/60 mmHg (12/19 0645) Weight:  [76.8 kg (169 lb 5 oz)] 76.8 kg (169 lb 5 oz) (12/19 0500)  Filed Weights   01/10/13 2125 01/16/13 0500 01/17/13 0500  Weight: 75.7 kg (166 lb 14.2 oz) 80.9 kg (178 lb 5.6 oz) 76.8 kg (169 lb 5 oz)    Weight change: -4.1 kg (-9 lb 0.6 oz)   Hemodynamic parameters for last 24 hours: PAP: (30-31)/(12-15) 30/12 mmHg  Intake/Output from previous day: 12/18 0701 - 12/19 0700 In: 1274.1 [P.O.:300; I.V.:869.1; IV Piggyback:105] Out: 1415 [Urine:1395; Chest Tube:20]  Intake/Output this shift:    Current Meds: Scheduled Meds: . acetaminophen  1,000 mg Oral Q6H   Or  . acetaminophen (TYLENOL) oral liquid 160 mg/5 mL  1,000 mg Per Tube Q6H  . acyclovir  400 mg Oral QHS  . aspirin EC  325 mg Oral Daily   Or  . aspirin  324 mg Per Tube Daily  . atorvastatin  40 mg Oral q1800  . bisacodyl  10 mg Oral Daily   Or  . bisacodyl  10 mg Rectal Daily  . cefUROXime (ZINACEF)  IV  1.5 g Intravenous Q12H  . clonazePAM   0.5 mg Oral QHS  . docusate sodium  200 mg Oral Daily  . insulin aspart  0-24 Units Subcutaneous Q4H  . insulin regular  0-10 Units Intravenous TID WC  . ketorolac  15 mg Intravenous Q6H  . metoprolol tartrate  12.5 mg Oral BID   Or  . metoprolol tartrate  12.5 mg Per Tube BID  . pantoprazole  40 mg Oral Daily  . sodium chloride  3 mL Intravenous Q12H   Continuous Infusions: . sodium chloride 20 mL/hr at 01/15/13 1430  . sodium chloride 20 mL/hr at 01/15/13 1430  . sodium chloride    . dexmedetomidine 0.1 mcg/kg/hr (01/15/13 1755)  . insulin (NOVOLIN-R) infusion 0.5 mL/hr at 01/16/13 0100  . lactated ringers 20 mL/hr at 01/15/13 1430  . nitroGLYCERIN 0 mcg/min (01/15/13 1430)  . phenylephrine (NEO-SYNEPHRINE) Adult infusion 8 mcg/min (01/17/13 0732)   PRN Meds:.metoprolol, midazolam, morphine injection, ondansetron (ZOFRAN) IV, oxyCODONE, sodium chloride  General appearance: alert, cooperative and no distress Neurologic: intact Heart: regular rate and rhythm Lungs: clear to auscultation bilaterally Abdomen: soft, non-tender; bowel sounds normal; no masses,  no organomegaly Extremities: SCDs in place Wound: Sternal dressing intact. RLE dressing is clean  and dry  Lab Results: CBC: Recent Labs  01/16/13 1655 01/16/13 1657 01/17/13 0410  WBC 9.7  --  8.7  HGB 8.8* 8.8* 7.8*  HCT 25.6* 26.0* 23.1*  PLT 165  --  139*   BMET:  Recent Labs  01/16/13 0400  01/16/13 1657 01/17/13 0410  NA 138  --  141 142  K 4.0  --  3.8 4.0  CL 104  --  101 107  CO2 24  --   --  28  GLUCOSE 104*  --  117* 115*  BUN 12  --  12 15  CREATININE 1.09  < > 1.10 1.18  CALCIUM 7.8*  --   --  7.7*  < > = values in this interval not displayed.  PT/INR:  Recent Labs  01/15/13 1435  LABPROT 16.5*  INR 1.37   Radiology: Dg Chest Portable 1 View In Am  01/16/2013   CLINICAL DATA:  Status post coronary bypass grafting  EXAM: PORTABLE CHEST - 1 VIEW  COMPARISON:  01/15/2013  FINDINGS:  Swan-Ganz catheter is again noted within the right pulmonary artery. A left-sided thoracostomy catheter and mediastinal drain remain. The nasogastric catheter and endotracheal tube have been removed. Minimal platelike atelectasis remains in the right lung base. Some new atelectatic changes are noted laterally in the left lung base.  IMPRESSION: New left basilar atelectasis.  The remainder of the exam is stable.   Electronically Signed   By: Alcide Clever M.D.   On: 01/16/2013 07:52   Dg Chest Portable 1 View  01/15/2013   CLINICAL DATA:  Status post coronary bypass grafting  EXAM: PORTABLE CHEST - 1 VIEW  COMPARISON:  01/10/2013  FINDINGS: The cardiac shadow is stable. Postsurgical changes are now seen. A Swan-Ganz catheter is noted within the right pulmonary artery. A an endotracheal tube is seen 6 cm above the carina. A nasogastric catheter is coiled within the stomach. Mediastinal drain left-sided thoracostomy catheter are noted. No pneumothorax is seen. Minimal right basilar atelectasis is noted.  IMPRESSION: Support apparatus as described.  Minimal right basilar atelectasis.   Electronically Signed   By: Alcide Clever M.D.   On: 01/15/2013 15:12     Assessment/Plan: S/P Procedure(s) (LRB): CORONARY ARTERY BYPASS GRAFTING (CABG) TIMES FOUR USING LEFT INTERNAL MAMMARY ARTERY AND RIGHT SAPHENOUS LEG VEIN HARVESTED ENDOSCOPICALLY (N/A) INTRAOPERATIVE TRANSESOPHAGEAL ECHOCARDIOGRAM (N/A)  1.CV- S/p NSTEMI.SR. On Neo synephrine gttp at 8.Lopressor 12.5 bid-will hold this am. Wean Neo.Will start ACE when bp allows. 2.Pulmonary-CXR this am appears to show no pneumothorax, bibasilar atelectasis, and small effusions. On 2 liters of oxygen via [place]. Will wean as tolerates. Encourage incentive spirometer. 3.Volume overload-Gentle diuresis 4.ABL anemia- H and H decreased to 7.8 and 23.1. Start Ferrous and monitor need for transfusion. 5.Mild thrombocytopenia-platelets 139,000. 6.CBGs 145/91/108. Pre op HGA1C  5.7. Will continue accu checks for 24 hours after transfer then stop. 7.Regarding headache, patient has had caffeine (coffee) daily for years. He feels this might be from withdrawal of it. 8.Hope to remove a line and foley soon  ZIMMERMAN,Andrew Sampson M PA-C 01/17/2013 7:42 AM Hold lasix this am and try to get neo off Poss stepdown later today if off neo I have seen and examined Andrew Sampson and agree with the above assessment  and plan.  [name] MD Beeper [phone] Office [phone] 01/17/2013 8:30 AM
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
## 2013-01-17 NOTE — Progress Notes (Addendum)
    Subjective:  Feels nausea. No SOB. "Heart burn". Feels worse today than yesterday.   Objective:  Vital Signs in the last 24 hours: Temp:  [98.6 F (37 C)-99.6 F (37.6 C)] 98.6 F (37 C) (12/19 1151) Pulse Rate:  [75-112] 91 (12/19 1200) Resp:  [18-30] 21 (12/19 1200) BP: (92-111)/(56-69) 98/62 mmHg (12/19 1200) SpO2:  [94 %-100 %] 97 % (12/19 1200) Arterial Line BP: (68-146)/(39-79) 124/55 mmHg (12/19 1200) Weight:  [169 lb 5 oz (76.8 kg)] 169 lb 5 oz (76.8 kg) (12/19 0500)  Intake/Output from previous day: 12/18 0701 - 12/19 0700 In: 1303.3 [P.O.:300; I.V.:898.3; IV Piggyback:105] Out: 1445 [Urine:1425; Chest Tube:20]   Physical Exam: General: Well developed, well nourished, in no acute distress, resting in bed. Head:  Normocephalic and atraumatic. Lungs: Clear to auscultation and percussion. Heart: Normal S1 and S2.  No murmur, rubs or gallops. Scar dressed.  Abdomen: soft, non-tender, positive bowel sounds. Extremities: No clubbing or cyanosis. No edema. Does not feel diaphoretic.  Neurologic: Alert and oriented x 3.    Lab Results:  Recent Labs  01/16/13 1655 01/16/13 1657 01/17/13 0410  WBC 9.7  --  8.7  HGB 8.8* 8.8* 7.8*  PLT 165  --  139*    Recent Labs  01/16/13 0400  01/16/13 1657 01/17/13 0410  NA 138  --  141 142  K 4.0  --  3.8 4.0  CL 104  --  101 107  CO2 24  --   --  28  GLUCOSE 104*  --  117* 115*  BUN 12  --  12 15  CREATININE 1.09  < > 1.10 1.18  < > = values in this interval not displayed. No results found for this basename: TROPONINI, CK, MB,  in the last 72 hours Hepatic Function Panel   Imaging: CXR - no PTX. Normal post op.   Personally viewed.   Telemetry: SR/STach.  Personally viewed.   EKG:  NSR, TWI no change from prior. No new ST changes. Personally viewed.    Assessment/Plan:  Principal Problem:   SEMI (subendocardial myocardial infarction) Active Problems:   Atypical chest pain   Hyperlipidemia   Statin  intolerance   GERD (gastroesophageal reflux disease)   Anxiety   Familial hyperlipidemia   S/P CABG x 4  1) Chest pain/heart burn/nausea - normal appearing bowel sounds, no rub, normal breath sounds, normal CXR. ECG with no change from prior. Likely GI in nature.   - Mylanta has been given  - PPI  - Zofran.   - BP stable in low 100's upper 90's SBP.   - Continue to watch Hg (7.8 from 8.8)  - Encourage PT when able.   - Control anxiety as well.    2) Remember to encourage statin given familial hyperlipidemia.   Will continue to monitor.    Skylur Fuston 01/17/2013, 12:39 PM

## 2013-01-17 NOTE — Progress Notes (Signed)
Patient ID: Andrew Sampson, male   DOB: Dec 01, 1960, 52 y.o.   MRN: [id number]  SICU Evening Rounds:  Hemodynamically stable  No problems today.

## 2013-01-17 NOTE — Progress Notes (Signed)
Nurse reports patient with chest pain, nausea, and diaphoresis. Upon my exam, patient was in sinus rhythm in the 80-90's and BP was 108/69. He is being weaned off Neo synephrine. The patient states he feels he has very bad heartburn after drinking cranberry juice. Zofran has been given for nausea. Today's EKG does not appear much different than yesterday's. I did not order cardiac enzymes as patient is POD #2 CABG.Will order his Nexium as Protonix does not seem to be working. Also, Mylanta PRN. I have also paged cardiology to evaluate and follow post op.

## 2013-01-18 LAB — GLUCOSE, CAPILLARY
Glucose-Capillary: 137 mg/dL — ABNORMAL HIGH (ref 70–99)
Glucose-Capillary: 101 mg/dL — ABNORMAL HIGH (ref 70–99)
Glucose-Capillary: 121 mg/dL — ABNORMAL HIGH (ref 70–99)

## 2013-01-18 MED ORDER — ESOMEPRAZOLE MAGNESIUM 40 MG PO CPDR
40.0000 mg | DELAYED_RELEASE_CAPSULE | Freq: Every day | ORAL | Status: DC
Start: 2013-01-19 — End: 2013-01-20
  Administered 2013-01-18 – 2013-01-20 (×3): 40 mg via ORAL
  Filled 2013-01-18 (×3): qty 1

## 2013-01-18 MED ORDER — CLONAZEPAM 0.5 MG PO TABS
0.5000 mg | ORAL_TABLET | Freq: Every day | ORAL | Status: DC
  Administered 2013-01-18 – 2013-01-19 (×2): 0.5 mg via ORAL
  Filled 2013-01-18 (×2): qty 1

## 2013-01-18 MED ORDER — SODIUM CHLORIDE 0.9 % IJ SOLN: 3.0000 mL | INTRAMUSCULAR | Status: DC | PRN

## 2013-01-18 MED ORDER — POTASSIUM CHLORIDE CRYS ER 20 MEQ PO TBCR
20.0000 meq | EXTENDED_RELEASE_TABLET | Freq: Every day | ORAL | Status: AC
  Administered 2013-01-18 – 2013-01-20 (×3): 20 meq via ORAL
  Filled 2013-01-18 (×3): qty 1

## 2013-01-18 MED ORDER — ASPIRIN EC 325 MG PO TBEC
325.0000 mg | DELAYED_RELEASE_TABLET | Freq: Every day | ORAL | Status: DC
  Administered 2013-01-19 – 2013-01-20 (×2): 325 mg via ORAL
  Filled 2013-01-18 (×2): qty 1

## 2013-01-18 MED ORDER — ALUM & MAG HYDROXIDE-SIMETH 200-200-20 MG/5ML PO SUSP
15.0000 mL | ORAL | Status: DC | PRN
  Administered 2013-01-18: 15 mL via ORAL
  Filled 2013-01-18 (×2): qty 30

## 2013-01-18 MED ORDER — BISACODYL 10 MG RE SUPP: 10.0000 mg | Freq: Every day | RECTAL | Status: DC | PRN

## 2013-01-18 MED ORDER — BISACODYL 5 MG PO TBEC: 10.0000 mg | DELAYED_RELEASE_TABLET | Freq: Every day | ORAL | Status: DC | PRN

## 2013-01-18 MED ORDER — DOCUSATE SODIUM 100 MG PO CAPS
200.0000 mg | ORAL_CAPSULE | Freq: Every day | ORAL | Status: DC
  Filled 2013-01-18: qty 2

## 2013-01-18 MED ORDER — SODIUM CHLORIDE 0.9 % IJ SOLN
3.0000 mL | Freq: Two times a day (BID) | INTRAMUSCULAR | Status: DC
  Administered 2013-01-18 – 2013-01-19 (×2): 3 mL via INTRAVENOUS

## 2013-01-18 MED ORDER — ONDANSETRON HCL 4 MG/2ML IJ SOLN: 4.0000 mg | Freq: Four times a day (QID) | INTRAMUSCULAR | Status: DC | PRN

## 2013-01-18 MED ORDER — FUROSEMIDE 40 MG PO TABS
40.0000 mg | ORAL_TABLET | Freq: Every day | ORAL | Status: AC
  Administered 2013-01-18 – 2013-01-20 (×3): 40 mg via ORAL
  Filled 2013-01-18 (×3): qty 1

## 2013-01-18 MED ORDER — MOVING RIGHT ALONG BOOK
Freq: Once | Status: AC
  Administered 2013-01-18: 20:00:00
  Filled 2013-01-18: qty 1

## 2013-01-18 MED ORDER — METOPROLOL TARTRATE 12.5 MG HALF TABLET
12.5000 mg | ORAL_TABLET | Freq: Two times a day (BID) | ORAL | Status: DC
  Administered 2013-01-18: 12.5 mg via ORAL
  Filled 2013-01-18 (×3): qty 1

## 2013-01-18 MED ORDER — ONDANSETRON HCL 4 MG PO TABS: 4.0000 mg | ORAL_TABLET | Freq: Four times a day (QID) | ORAL | Status: DC | PRN

## 2013-01-18 MED ORDER — SODIUM CHLORIDE 0.9 % IV SOLN: 250.0000 mL | INTRAVENOUS | Status: DC | PRN

## 2013-01-18 NOTE — Progress Notes (Signed)
3 Days Post-Op Procedure(s) (LRB): CORONARY ARTERY BYPASS GRAFTING (CABG) TIMES FOUR USING LEFT INTERNAL MAMMARY ARTERY AND RIGHT SAPHENOUS LEG VEIN HARVESTED ENDOSCOPICALLY (N/A) INTRAOPERATIVE TRANSESOPHAGEAL ECHOCARDIOGRAM (N/A) Subjective: No specific complaints. Feels much better than yesterday.  Objective: Vital signs in last 24 hours: Temp:  [98.2 F (36.8 C)-99.6 F (37.6 C)] 98.2 F (36.8 C) (12/20 0805) Pulse Rate:  [87-117] 95 (12/20 1009) Cardiac Rhythm:  [-] Normal sinus rhythm;Sinus tachycardia (12/20 0800) Resp:  [17-30] 18 (12/20 0700) BP: (97-114)/(58-74) 110/74 mmHg (12/20 1009) SpO2:  [88 %-100 %] 94 % (12/20 0700) Arterial Line BP: (124-140)/(55-64) 124/55 mmHg (12/19 1200) FiO2 (%):  [2 %] 2 % (12/20 0030) Weight:  [79.7 kg (175 lb 11.3 oz)] 79.7 kg (175 lb 11.3 oz) (12/20 0500)  Hemodynamic parameters for last 24 hours:    Intake/Output from previous day: 12/19 0701 - 12/20 0700 In: 1226.8 [P.O.:840; I.V.:386.8] Out: 1030 [Urine:1030] Intake/Output this shift:    General appearance: alert and cooperative Neurologic: intact Heart: regular rate and rhythm, S1, S2 normal, no murmur, click, rub or gallop Lungs: clear to auscultation bilaterally Extremities: extremities normal, atraumatic, no cyanosis or edema Wound: dressing dry  Lab Results:  Recent Labs  01/16/13 1655 01/16/13 1657 01/17/13 0410  WBC 9.7  --  8.7  HGB 8.8* 8.8* 7.8*  HCT 25.6* 26.0* 23.1*  PLT 165  --  139*   BMET:  Recent Labs  01/16/13 0400  01/16/13 1657 01/17/13 0410  NA 138  --  141 142  K 4.0  --  3.8 4.0  CL 104  --  101 107  CO2 24  --   --  28  GLUCOSE 104*  --  117* 115*  BUN 12  --  12 15  CREATININE 1.09  < > 1.10 1.18  CALCIUM 7.8*  --   --  7.7*  < > = values in this interval not displayed.  PT/INR:  Recent Labs  01/15/13 1435  LABPROT 16.5*  INR 1.37   ABG    Component Value Date/Time   PHART 7.331* 01/15/2013 1913   HCO3 24.1* 01/15/2013  1913   TCO2 23 01/16/2013 1657   ACIDBASEDEF 2.0 01/15/2013 1913   O2SAT 98.0 01/15/2013 1913   CBG (last 3)   Recent Labs  01/18/13 0020 01/18/13 0359 01/18/13 0803  GLUCAP 137* 95 110*    Assessment/Plan: S/P Procedure(s) (LRB): CORONARY ARTERY BYPASS GRAFTING (CABG) TIMES FOUR USING LEFT INTERNAL MAMMARY ARTERY AND RIGHT SAPHENOUS LEG VEIN HARVESTED ENDOSCOPICALLY (N/A) INTRAOPERATIVE TRANSESOPHAGEAL ECHOCARDIOGRAM (N/A) Mobilize Diuresis: weight still 9 lbs over preop if accurate. Expected acute blood loss anemia. Will start Fe2+ Plan for transfer to step-down: see transfer orders   LOS: 8 days    BARTLE,BRYAN K 01/18/2013

## 2013-01-18 NOTE — Progress Notes (Signed)
Nutrition Brief Note  Patient identified on the Malnutrition Screening Tool (MST) Report. Pt reports usual body weight is 165 - 170 lb. Eating well PTA. We discussed current heart healthy diet guidelines.  Wt Readings from Last 15 Encounters:  01/18/13 175 lb 11.3 oz (79.7 kg)  01/18/13 175 lb 11.3 oz (79.7 kg)  01/18/13 175 lb 11.3 oz (79.7 kg)    Body mass index is 24.52 kg/(m^2). Patient meets criteria for normal weight based on current BMI.   Current diet order is Heart Healthy. Labs and medications reviewed.   No nutrition interventions warranted at this time. If nutrition issues arise, please consult RD.   Jarold Motto MS, RD, LDN Pager: [phone] After-hours pager: [phone]

## 2013-01-18 NOTE — Progress Notes (Signed)
Pt has been anxious tonight with many questions and concerns.  He reports 3 loose BM's today. Specipan placed in commode to catch next one. He is also afraid to drink for fear of nausea. Crackers given with pm meds as pt requested. He also feels "bloated".  Abdomen somewhat tight, soft with active bowel sounds. Pt is up independently in room and states having walked several times today.  HR is ST 105-120. PM clonazepam and metoprolol given.  Will continue to monitor.

## 2013-01-19 MED ORDER — ACYCLOVIR 400 MG PO TABS
400.0000 mg | ORAL_TABLET | Freq: Every day | ORAL | Status: DC
  Administered 2013-01-19: 400 mg via ORAL
  Filled 2013-01-19 (×2): qty 1

## 2013-01-19 MED ORDER — METOPROLOL TARTRATE 25 MG PO TABS
25.0000 mg | ORAL_TABLET | Freq: Two times a day (BID) | ORAL | Status: DC
  Administered 2013-01-20: 25 mg via ORAL
  Filled 2013-01-19 (×4): qty 1

## 2013-01-19 MED ORDER — DOCUSATE SODIUM 100 MG PO CAPS: 200.0000 mg | ORAL_CAPSULE | Freq: Two times a day (BID) | ORAL | Status: DC | PRN

## 2013-01-19 NOTE — Progress Notes (Addendum)
      [address]       [place] [address]             [phone]      4 Days Post-Op Procedure(s) (LRB): CORONARY ARTERY BYPASS GRAFTING (CABG) TIMES FOUR USING LEFT INTERNAL MAMMARY ARTERY AND RIGHT SAPHENOUS LEG VEIN HARVESTED ENDOSCOPICALLY (N/A) INTRAOPERATIVE TRANSESOPHAGEAL ECHOCARDIOGRAM (N/A)  Subjective:  Andrew Sampson states he doesn't feel well today.  He states he is "clammy" and has a low grade temperature.  He states he had a lot of diarrhea yesterday.  He continues to have occasional nausea.  He also has a lot of anxiety and is concerned how he will manage it at discharge.   Objective: Vital signs in last 24 hours: Temp:  [98.4 F (36.9 C)-100 F (37.8 C)] 100 F (37.8 C) (12/20 2024) Pulse Rate:  [94-103] 94 (12/20 2024) Cardiac Rhythm:  [-] Sinus tachycardia (12/20 2024) Resp:  [18-24] 19 (12/20 2024) BP: (103-122)/(67-74) 122/71 mmHg (12/20 2024) SpO2:  [89 %-97 %] 97 % (12/20 2024)  Intake/Output from previous day: 12/20 0701 - 12/21 0700 In: 360 [P.O.:360] Out: 750 [Urine:750] :  General appearance: alert, cooperative and no distress Heart: regular rate and rhythm Lungs: clear to auscultation bilaterally Abdomen: soft, non-tender; bowel sounds normal; no masses,  no organomegaly Extremities: edema trace Wound: clean and dry  Lab Results:  Recent Labs  01/16/13 1655 01/16/13 1657 01/17/13 0410  WBC 9.7  --  8.7  HGB 8.8* 8.8* 7.8*  HCT 25.6* 26.0* 23.1*  PLT 165  --  139*   BMET:  Recent Labs  01/16/13 1657 01/17/13 0410  NA 141 142  K 3.8 4.0  CL 101 107  CO2  --  28  GLUCOSE 117* 115*  BUN 12 15  CREATININE 1.10 1.18  CALCIUM  --  7.7*    PT/INR: No results found for this basename: LABPROT, INR,  in the last 72 hours ABG    Component Value Date/Time   PHART 7.331* 01/15/2013 1913   HCO3 24.1* 01/15/2013 1913   TCO2 23 01/16/2013 1657   ACIDBASEDEF 2.0 01/15/2013 1913   O2SAT 98.0 01/15/2013 1913   CBG  (last 3)   Recent Labs  01/18/13 1155 01/18/13 1652 01/18/13 2004  GLUCAP 101* 121* 110*    Assessment/Plan: S/P Procedure(s) (LRB): CORONARY ARTERY BYPASS GRAFTING (CABG) TIMES FOUR USING LEFT INTERNAL MAMMARY ARTERY AND RIGHT SAPHENOUS LEG VEIN HARVESTED ENDOSCOPICALLY (N/A) INTRAOPERATIVE TRANSESOPHAGEAL ECHOCARDIOGRAM (N/A)  1. CV- NSR- HR tachy, SBP 120s- will increase Lopressor to 25 mg BID 2. Pulm- no acute issues continue IS 3. Renal- patient is mildly volume overloaded, continue diuresis, will repeat BMET in AM 4. GI- + diarrhea and nausea- zofran providing relief will make all stool softners PRN order 5. ID- febrile this morning, likely due to atelectasis/ SIRS will monitor, repeat CBC in AM 6. Dispo- patient with nausea, diarrhea, anxiety, low grade fever, repeat labs   LOS: 9 days    Lowella Dandy 01/19/2013   Chart reviewed, patient examined, agree with above. I think is doing well overall. He has expected acute blood loss anemia and we have avoided transfusion so he is going to have some mild tachycardia, generalized weakness. I think a lot of his problem is anxiety. He can probably go home tomorrow if no changes.

## 2013-01-19 NOTE — Progress Notes (Signed)
EPWs removed per Md order and protocol. Wire Ends intact. Pt tolerated procedure well. Vitals signs stable. Pt resting in bed X 1 hour. Call bell within reach. Bed Alarm on. Will continue to monitor.

## 2013-01-19 NOTE — Discharge Summary (Signed)
Physician Discharge Summary  Patient ID: Andrew Sampson MRN: [id number] DOB/AGE: [birth year] 52 y.o.  Admit date: 01/10/2013 Discharge date: 01/19/2013  Admission Diagnoses:  Patient Active Problem List   Diagnosis Date Noted  . Familial hyperlipidemia 01/12/2013  . SEMI (subendocardial myocardial infarction) 01/11/2013  . Atypical chest pain 01/10/2013  . Hyperlipidemia 01/10/2013  . Statin intolerance 01/10/2013  . GERD (gastroesophageal reflux disease) 01/10/2013  . Anxiety 01/10/2013   Discharge Diagnoses:   Patient Active Problem List   Diagnosis Date Noted  . 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 Familial hyperlipidemia 01/12/2013  . SEMI (subendocardial myocardial infarction) 01/11/2013  . Atypical chest pain 01/10/2013  . Hyperlipidemia 01/10/2013  . Statin intolerance 01/10/2013  . GERD (gastroesophageal reflux disease) 01/10/2013  . Anxiety 01/10/2013   Discharged Condition: good  History of Present Illness:   [name] is a 52 yo white male with no known history of CAD.  He presents with a three week history on increasing episodes of substernal chest pain with radiation into his neck.  He described the pain as a "burning" sensation.  On the day of presentation the patient was biking riding on a local trail.  He states he noticed increasing symptoms of chest discomfort, numbness in left arm, and a near syncopal episode.  He presented to [facility] Emergency Department where he was admitted for observation.  Serial cardiac enzymes were positive and the patient was transferred to [facility] for further care.   Hospital Course:   Upon arrival the patient was placed on Heparin, Lopressor and Effient.  He remained chest pain free.  He was taken for cardiac catheterization on 01/11/2013 which showed severe 3 vessel CAD with mild reduction of his ejection fraction.  It was felt Coronary bypass procedure would be his best treatment option.  However, being the patient was loaded with  Effient he would need to wait until his platelet function was no longer inhibited prior to proceeding with surgery.  TCTS was consulted and the patient was evaluated by Dr. Tyrone Sampson.  He was in agreement patient would benefit from Coronary Bypass, however due to Effient load surgery would be delayed.  The risks and benefits of the procedure were explained to the patient and he was agreeable to proceed.  The patient was taken to the operating room on 01/15/2013.  He underwent CABG x 4 utilizing LIMA to LAD, SVG to Diagonal, SVG to OM, and SVG to RCA.  He also underwent Endoscopic Saphenous vein harvest from his right leg.  He tolerated the procedure well and was taken to the SICU in stable condition.  The patient was extubated the evening of surgery.  During his stay in the ICU the patient the patient was weaned off drips as tolerated.  His chest tubes and arterial lines were removed without difficulty.  The patient had a lot of problems with nausea and heartburn.  Initially did this not improve with use of medication.  EKG was obtained and no acute changes were observed. We transitioned the patient to his home GERD medications and these symptoms have improved with use of Zofran.  He was placed on iron supplementation for post operative anemia.  He was ambulation around the unit and was therefore transferred to the telemetry unit.  The patient has now developed diarrhea.  He states that he has had this happen before with use of stool softeners.  These medications were discontinued.  The patient is maintaining NSR and his pacing wires have been  removed.  The patient has anxiety and questions ability to receive Xanax to manage this.  The patient is medically stable otherwise.   He is ambulating without difficulty and tolerating a heart healthy diet.  Should no issues arise overnight we anticipate discharge in the next 24-48 hours.  He will follow up with Dr. Tyrone Sampson in 3 weeks with a CXR prior to his appointment.  He  will also need to follow up with Cardiology in 2-4 weeks.      Significant Diagnostic Studies: angiography:   Hemodynamic Findings:  Central aortic pressure: 143/87  Left ventricular pressure: 133/6/12  Angiographic Findings:  Left main: Normal caliber vessel with mild luminal irregularity. This vessel bifurcates into the LAD and Circumflex.  Left Anterior Descending Artery: Large caliber vessel that courses to the apex. The proximal vessel has diffuse 50% stenosis with haziness just before the first diagonal branch. The mid vessel has a 70% stenosis just beyond the first diagonal branch. The more distal segment of the mid vessel has a long 60% stenosis followed by a smooth 95% stenosis. The first diagonal branch is moderate in caliber and has a focal 99% stenosis in the proximal segment with TIMI-1 flow down the vessel.  Circumflex Artery: Large caliber vessel with two obtuse marginal branches and large left sided posterolateral branch. The moderate caliber first obtuse marginal branch has a focal 99% stenosis. The second obtuse marginal branch and the posterolateral branch are moderate in caliber with mild luminal irregularities.  Right Coronary Artery: Moderate caliber non-dominant vessel with 100% total occlusion in the mid vessel. The mid and distal vessel fills from bridging right to right collaterals.  Left Ventricular Angiogram: LvEF=45% with hypokinesis of the anterolateral wall  Treatments: surgery:   Coronary artery bypass grafting x4 with left internal mammary to the left anterior descending coronary artery,  reverse saphenous vein graft to the intermediate coronary artery, reverse saphenous vein graft to the first obtuse marginal coronary artery, reverse saphenous vein graft to the right coronary artery with right thigh and calf endo vein harvesting.  Disposition: Home  Discharge Medications:     Medication List    STOP taking these medications       azithromycin 250 MG  tablet  Commonly known as:  ZITHROMAX      TAKE these medications       acyclovir 200 MG capsule  Commonly known as:  ZOVIRAX  Take 400 mg by mouth at bedtime.     aspirin 325 MG EC tablet  Take 1 tablet (325 mg total) by mouth daily.     atorvastatin 40 MG tablet  Commonly known as:  LIPITOR  Take 1 tablet (40 mg total) by mouth daily at 6 PM.     clonazePAM 0.5 MG tablet  Commonly known as:  KLONOPIN  Take 0.5 mg by mouth at bedtime.     esomeprazole 40 MG capsule  Commonly known as:  NEXIUM  Take 40 mg by mouth daily at 12 noon.     ferrous sulfate 325 (65 FE) MG tablet  Take 1 tablet (325 mg total) by mouth daily with breakfast.     folic acid 1 MG tablet  Commonly known as:  FOLVITE  Take 1 tablet (1 mg total) by mouth daily.     loratadine 10 MG tablet  Commonly known as:  CLARITIN  Take 10 mg by mouth daily.     metoprolol tartrate 25 MG tablet  Commonly known as:  LOPRESSOR  Take 1  tablet (25 mg total) by mouth 2 (two) times daily.     omega-3 acid ethyl esters 1 G capsule  Commonly known as:  LOVAZA  Take 2 g by mouth daily.     traMADol 50 MG tablet  Commonly known as:  ULTRAM  Take 2 tablets (100 mg total) by mouth every 6 (six) hours as needed for moderate pain.         The patient has been discharged on:   1.Beta Blocker:  Yes [ x  ]                              No   [   ]                              If No, reason:  2.Ace Inhibitor/ARB: Yes [   ]                                     No  [  x  ]                                     If No, reason: Labile blood pressure  3.Statin:   Yes [ x  ]                  No  [   ]                  If No, reason:  4.Ecasa:  Yes  [ x  ]                  No   [   ]            Follow-up Information   Follow up with GERHARDT,EDWARD B, MD In 3 weeks. (Office will contact you)    Specialty:  Cardiothoracic Surgery   Contact information:   [address] [employer] [address] [place]  [address] [phone]       Follow up with [facility] In 3 weeks. (Please get CXR 1 hour prior to your appointment with Dr. Tyrone Sampson)    Contact information:   [place]       Follow up with [name], MD. (2 weeks- call office to arrange)    Specialty:  Cardiology   Contact information:   [address] [place] [address] [phone]       Signed: Lowella Dandy 01/19/2013, 10:58 AM

## 2013-01-19 NOTE — Progress Notes (Signed)
Pt ambulated 300 ft independently and tolerated activity well. Will continue to monitor.

## 2013-01-19 NOTE — Progress Notes (Signed)
Pt ambulated in hallway 550 ft on room air and tolerated activity well. Pt's heart rate remained in 90's to low 100's. Pt tolerated activity well. Will continue to monitor.

## 2013-01-20 LAB — BASIC METABOLIC PANEL
BUN: 13 mg/dL (ref 6–23)
Calcium: 8.4 mg/dL (ref 8.4–10.5)
GFR calc non Af Amer: 70 mL/min — ABNORMAL LOW (ref >90)
Glucose, Bld: 109 mg/dL — ABNORMAL HIGH (ref 70–99)
Sodium: 139 mEq/L (ref 135–145)

## 2013-01-20 LAB — CBC
HCT: 25.8 % — ABNORMAL LOW (ref 39.0–52.0)
Hemoglobin: 8.6 g/dL — ABNORMAL LOW (ref 13.0–17.0)
MCH: 30.4 pg (ref 26.0–34.0)
MCHC: 33.3 g/dL (ref 30.0–36.0)
RDW: 13.1 % (ref 11.5–15.5)
WBC: 6.5 10*3/uL (ref 4.0–10.5)

## 2013-01-20 MED ORDER — ATORVASTATIN CALCIUM 40 MG PO TABS: 40.0000 mg | ORAL_TABLET | Freq: Every day | ORAL | Status: DC

## 2013-01-20 MED ORDER — FOLIC ACID 1 MG PO TABS: 1.0000 mg | ORAL_TABLET | Freq: Every day | ORAL | Status: DC

## 2013-01-20 MED ORDER — FERROUS SULFATE 325 (65 FE) MG PO TABS: 325.0000 mg | ORAL_TABLET | Freq: Every day | ORAL | Status: DC

## 2013-01-20 MED ORDER — ALPRAZOLAM 0.25 MG PO TABS: 0.2500 mg | ORAL_TABLET | Freq: Two times a day (BID) | ORAL | Status: DC | PRN

## 2013-01-20 MED ORDER — ASPIRIN 325 MG PO TBEC: 325.0000 mg | DELAYED_RELEASE_TABLET | Freq: Every day | ORAL | Status: DC

## 2013-01-20 MED ORDER — METOPROLOL TARTRATE 25 MG PO TABS: 25.0000 mg | ORAL_TABLET | Freq: Two times a day (BID) | ORAL | Status: DC

## 2013-01-20 MED ORDER — TRAMADOL HCL 50 MG PO TABS: 100.0000 mg | ORAL_TABLET | Freq: Four times a day (QID) | ORAL | Status: DC | PRN

## 2013-01-20 NOTE — Progress Notes (Signed)
Spoke PA Gershon Crane to make him aware of the patient's complaint of tingling in his last two fingers on his left hand. [name] was aware.

## 2013-01-20 NOTE — Progress Notes (Addendum)
Per RN Richelle with verbal order from [place]. Removed CT sutures and applied benzoin and 1/2 " steri strips.  Pt tolerated procedure well. Pt given signs and symptoms of infection. Pt given signs and symptoms of infection. Pt resting with call bell within reach.  Will continue to monitor. Andrew Sampson

## 2013-01-20 NOTE — Progress Notes (Addendum)
[address]       [place] [address]             [phone]      5 Days Post-Op  Procedure(s) (LRB): CORONARY ARTERY BYPASS GRAFTING (CABG) TIMES FOUR USING LEFT INTERNAL MAMMARY ARTERY AND RIGHT SAPHENOUS LEG VEIN HARVESTED ENDOSCOPICALLY (N/A) INTRAOPERATIVE TRANSESOPHAGEAL ECHOCARDIOGRAM (N/A) Subjective: Feels 100% better  Objective  Telemetry sinus rhythm, sinus tachy  Temp:  [99.3 F (37.4 C)-99.7 F (37.6 C)] 99.3 F (37.4 C) (12/22 0403) Pulse Rate:  [96-118] 100 (12/22 0403) Resp:  [18-20] 20 (12/22 0403) BP: (92-114)/(57-72) 111/68 mmHg (12/22 0403) SpO2:  [92 %-96 %] 92 % (12/22 0403) Weight:  [173 lb 8 oz (78.7 kg)] 173 lb 8 oz (78.7 kg) (12/22 0403)   Intake/Output Summary (Last 24 hours) at 01/20/13 0754 Last data filed at 01/20/13 0310  Gross per 24 hour  Intake    240 ml  Output   3100 ml  Net  -2860 ml       General appearance: alert, cooperative and no distress Heart: regular rate and rhythm Lungs: min dim in bases Abdomen: benign Extremities: no edema Wound: incisions healing well  Lab Results:  Recent Labs  01/20/13 0520  NA 139  K 4.0  CL 102  CO2 28  GLUCOSE 109*  BUN 13  CREATININE 1.15  CALCIUM 8.4   No results found for this basename: AST, ALT, ALKPHOS, BILITOT, PROT, ALBUMIN,  in the last 72 hours No results found for this basename: LIPASE, AMYLASE,  in the last 72 hours  Recent Labs  01/20/13 0520  WBC 6.5  HGB 8.6*  HCT 25.8*  MCV 91.2  PLT 316   No results found for this basename: CKTOTAL, CKMB, TROPONINI,  in the last 72 hours No components found with this basename: POCBNP,  No results found for this basename: DDIMER,  in the last 72 hours No results found for this basename: HGBA1C,  in the last 72 hours No results found for this basename: CHOL, HDL, LDLCALC, TRIG, CHOLHDL,  in the last 72 hours No results found for this basename: TSH, T4TOTAL, FREET3, T3FREE, THYROIDAB,  in the last  72 hours No results found for this basename: VITAMINB12, FOLATE, FERRITIN, TIBC, IRON, RETICCTPCT,  in the last 72 hours  Medications: Scheduled . acyclovir  400 mg Oral QHS  . aspirin EC  325 mg Oral Daily  . atorvastatin  40 mg Oral q1800  . clonazePAM  0.5 mg Oral QHS  . esomeprazole  40 mg Oral Q1200  . ferrous sulfate  325 mg Oral Q breakfast  . folic acid  1 mg Oral Daily  . furosemide  40 mg Oral Daily  . metoprolol tartrate  25 mg Oral BID  . potassium chloride  20 mEq Oral Daily  . sodium chloride  3 mL Intravenous Q12H     Radiology/Studies:  No results found.  INR: Will add last result for INR, ABG once components are confirmed Will add last 4 CBG results once components are confirmed  Assessment/Plan: S/P Procedure(s) (LRB): CORONARY ARTERY BYPASS GRAFTING (CABG) TIMES FOUR USING LEFT INTERNAL MAMMARY ARTERY AND RIGHT SAPHENOUS LEG VEIN HARVESTED ENDOSCOPICALLY (N/A) INTRAOPERATIVE TRANSESOPHAGEAL ECHOCARDIOGRAM (N/A) Plan for discharge: see discharge orders   LOS: 10 days    GOLD,Andrew Sampson 12/22/20147:54 AM  Plan d/c today, patient says numbness in fingers was brief after bloof draw this morning now gone I have seen and examined Jonny Ruiz  S Geerdes and agree with the above assessment  and plan.  [name] MD Beeper [phone] Office [phone] 01/20/2013 12:31 PM

## 2013-01-20 NOTE — Progress Notes (Signed)
NURSING PROGRESS NOTE  Andrew Sampson [id number] Discharge Data: 01/20/2013 4:11 PM Attending Provider: No att. providers found WUJ:WJXB,[id number], MD     [name] to be D/C'd Home per MD order.  Discussed with the patient the After Visit Summary and all questions fully answered. All IV's discontinued with no bleeding noted. All belongings returned to patient for patient to take home.   Last Vital Signs:  Blood pressure 111/68, pulse 100, temperature 99.3 F (37.4 C), temperature source Oral, resp. rate 20, height 5\' 11"  (1.803 m), weight 78.7 kg (173 lb 8 oz), SpO2 92.00%.  Discharge Medication List   Medication List    STOP taking these medications       azithromycin 250 MG tablet  Commonly known as:  ZITHROMAX      TAKE these medications       acyclovir 200 MG capsule  Commonly known as:  ZOVIRAX  Take 400 mg by mouth at bedtime.     ALPRAZolam 0.25 MG tablet  Commonly known as:  XANAX  Take 1 tablet (0.25 mg total) by mouth 2 (two) times daily as needed for anxiety.     aspirin 325 MG EC tablet  Take 1 tablet (325 mg total) by mouth daily.     atorvastatin 40 MG tablet  Commonly known as:  LIPITOR  Take 1 tablet (40 mg total) by mouth daily at 6 PM.     clonazePAM 0.5 MG tablet  Commonly known as:  KLONOPIN  Take 0.5 mg by mouth at bedtime.     esomeprazole 40 MG capsule  Commonly known as:  NEXIUM  Take 40 mg by mouth daily at 12 noon.     ferrous sulfate 325 (65 FE) MG tablet  Take 1 tablet (325 mg total) by mouth daily with breakfast.     folic acid 1 MG tablet  Commonly known as:  FOLVITE  Take 1 tablet (1 mg total) by mouth daily.     loratadine 10 MG tablet  Commonly known as:  CLARITIN  Take 10 mg by mouth daily.     metoprolol tartrate 25 MG tablet  Commonly known as:  LOPRESSOR  Take 1 tablet (25 mg total) by mouth 2 (two) times daily.     omega-3 acid ethyl esters 1 G capsule  Commonly known as:  LOVAZA  Take 2 g by mouth daily.     traMADol 50 MG tablet  Commonly known as:  ULTRAM  Take 2 tablets (100 mg total) by mouth every 6 (six) hours as needed for moderate pain.

## 2013-01-20 NOTE — Progress Notes (Signed)
[id number] Pt walking independently so did not walk. Education completed with pt. Understanding voiced. Discussed CRP 2 and permission given to refer to [facility] Phase 2. Encouraged IS.  Luetta Nutting RN BSN 01/20/2013 9:12 AM

## 2013-01-21 MED FILL — Potassium Chloride Inj 2 mEq/ML: INTRAVENOUS | Qty: 40 | Status: AC

## 2013-01-21 MED FILL — Heparin Sodium (Porcine) Inj 1000 Unit/ML: INTRAMUSCULAR | Qty: 30 | Status: AC

## 2013-01-21 MED FILL — Dexmedetomidine HCl IV Soln 200 MCG/2ML: INTRAVENOUS | Qty: 2 | Status: AC

## 2013-01-21 MED FILL — Magnesium Sulfate Inj 50%: INTRAMUSCULAR | Qty: 10 | Status: AC

## 2013-01-25 ENCOUNTER — Telehealth: Payer: Self-pay | Admitting: Internal Medicine

## 2013-01-25 NOTE — Telephone Encounter (Signed)
Mr. Launer referred to Cardiology, (primary Dr. Eden Emms) from [facility] for slightly lower sbp after walking. He was recently discharged from CABG and he's built up from Monday until today from 2x5 minute walks to 2x15 minutes. He felt a bit pale/ashen and checked his bp at 100 at end of his walk. He did not feel lightheaded or presyncope. He quickly recovered. He is not eating much (estimated 1000 kcal daily) or drinking much fluid 2-3 glasses fluid daily. We reviewed his labs (normal renal function), h/h 8.6/26 (anemia) and discussed importance of good fluid intake and increased caloric intake (discussed ensure). He wanted to know warning signs, which we discussed - presyncope, overt syncope, chest pain, other changes, any concerns, not continuing to improve and discussed to call back or call EMS with any further change in symptoms. He preferred to stay home for now. No signs of bleeding.

## 2013-01-27 ENCOUNTER — Encounter: Payer: Self-pay | Admitting: Cardiothoracic Surgery

## 2013-01-27 DIAGNOSIS — I679 Cerebrovascular disease, unspecified: Secondary | ICD-10-CM

## 2013-01-27 HISTORY — DX: Cerebrovascular disease, unspecified: I67.9

## 2013-02-04 ENCOUNTER — Encounter: Payer: Self-pay | Admitting: *Deleted

## 2013-02-04 ENCOUNTER — Encounter: Payer: Self-pay | Admitting: Cardiovascular Disease

## 2013-02-07 ENCOUNTER — Ambulatory Visit (INDEPENDENT_AMBULATORY_CARE_PROVIDER_SITE_OTHER): Payer: BC Managed Care – PPO | Admitting: Cardiovascular Disease

## 2013-02-07 ENCOUNTER — Encounter: Payer: BC Managed Care – PPO | Admitting: Cardiology

## 2013-02-07 ENCOUNTER — Encounter: Payer: Self-pay | Admitting: Cardiovascular Disease

## 2013-02-07 VITALS — BP 111/79 | HR 93 | Ht 71.0 in | Wt 158.0 lb

## 2013-02-07 DIAGNOSIS — D649 Anemia, unspecified: Secondary | ICD-10-CM

## 2013-02-07 DIAGNOSIS — Z951 Presence of aortocoronary bypass graft: Secondary | ICD-10-CM

## 2013-02-07 DIAGNOSIS — Z789 Other specified health status: Secondary | ICD-10-CM

## 2013-02-07 NOTE — Progress Notes (Signed)
Patient ID: Andrew Sampson, male   DOB: 07/09/1960, 53 y.o.   MRN: [id number] [name] is a 53 yo white male with no known history of CAD. [facility]  with a three week history on increasing episodes of substernal chest pain with radiation into his neck. He described the pain as a "burning" sensation. On the day of presentation the patient was biking riding on a local trail. He states he noticed increasing symptoms of chest discomfort, numbness in left arm, and a near syncopal episode. He presented [facility] Emergency Department where he was admitted for observation. Serial cardiac enzymes were positive and the patient was transferred to [facility] care.  Subsequent cath showed 3VD with D1 being culprit for SEMI  EF 45%  01/10/13 CABG by Dr Tyrone SageGerhardt: 12/18  SURGICAL PROCEDURE: Coronary artery bypass grafting x4 with left  internal mammary to the left anterior descending coronary artery,  reverse saphenous vein graft to the intermediate coronary artery,  reverse saphenous vein graft to the first obtuse marginal coronary  artery, reverse saphenous vein graft to the right coronary artery with  right thigh and calf endo vein harvesting.   Post op depression  Anxious  Multiple somatic complaints.  Still not back to work Walking RLE pain Previous college injury and sight of SVG harvest   No fevers  Chest healing well     ROS: Denies fever, malais, weight loss, blurry vision, decreased visual acuity, cough, sputum, SOB, hemoptysis, pleuritic pain, palpitaitons, heartburn, abdominal pain, melena, lower extremity edema, claudication, or rash.  All other systems reviewed and negative  General: Affect appropriate Healthy:  appears stated age HEENT: normal Neck supple with no adenopathy JVP normal no bruits no thyromegaly Lungs clear with no wheezing and good diaphragmatic motion Heart:  S1/S2 no murmur, no rub, gallop or click PMI normal Abdomen: benighn, BS  positve, no tenderness, no AAA no bruit.  No HSM or HJR Distal pulses intact with no bruits No edema Neuro non-focal Skin warm and dry  Right SV endoscopic harvest sight A  No muscular weakness   Current Outpatient Prescriptions  Medication Sig Dispense Refill  . acyclovir (ZOVIRAX) 200 MG capsule Take 400 mg by mouth at bedtime.      . ALPRAZolam (XANAX) 0.25 MG tablet Take 1 tablet (0.25 mg total) by mouth 2 (two) times daily as needed for anxiety.  10 tablet  0  . aspirin EC 325 MG EC tablet Take 1 tablet (325 mg total) by mouth daily.      Marland Kitchen. atorvastatin (LIPITOR) 40 MG tablet Take 1 tablet (40 mg total) by mouth daily at 6 PM.  30 tablet  1  . clonazePAM (KLONOPIN) 0.5 MG tablet Take 0.5 mg by mouth at bedtime.      Marland Kitchen. esomeprazole (NEXIUM) 40 MG capsule Take 40 mg by mouth daily at 12 noon.      . ferrous sulfate 325 (65 FE) MG tablet Take 1 tablet (325 mg total) by mouth daily with breakfast.  30 tablet  1  . folic acid (FOLVITE) 1 MG tablet Take 1 tablet (1 mg total) by mouth daily.  30 tablet  1  . loratadine (CLARITIN) 10 MG tablet Take 10 mg by mouth daily.      . metoprolol tartrate (LOPRESSOR) 25 MG tablet Take 1 tablet (25 mg total) by mouth 2 (two) times daily.  60 tablet  1  . naproxen (NAPROSYN) 500 MG tablet prn      .  omega-3 acid ethyl esters (LOVAZA) 1 G capsule Take 2 g by mouth daily.      . RESTASIS 0.05 % ophthalmic emulsion daily      . traMADol (ULTRAM) 50 MG tablet Take 2 tablets (100 mg total) by mouth every 6 (six) hours as needed for moderate pain.  50 tablet  0   No current facility-administered medications for this visit.    Allergies  Review of patient's allergies indicates no known allergies.  Electrocardiogram:  12/18  SR rate 84 lateral T wave changes   Assessment and Plan

## 2013-02-07 NOTE — Assessment & Plan Note (Signed)
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  F/U lipid clinic in 3 months

## 2013-02-07 NOTE — Patient Instructions (Addendum)
Your physician recommends that you schedule a follow-up appointment in:  NEXT  [facility] Your physician recommends that you continue on your current medications as directed. Please refer to the Current Medication list given to you today.  Your physician recommends that you return for lab work in:  NEXT  WEEK   HGB  HCT  IRON  FERRITIN  RETIC  NEEDS APPT  WITH  JEREMY IN LIPID CLINIC IN   8 WEEKS

## 2013-02-07 NOTE — Assessment & Plan Note (Signed)
Concern for developing depression Rehab to start next week  Continue FESO4 Rx  Check feso4/ferritin and Hct next week

## 2013-02-11 ENCOUNTER — Other Ambulatory Visit: Payer: Self-pay | Admitting: *Deleted

## 2013-02-11 DIAGNOSIS — Z951 Presence of aortocoronary bypass graft: Secondary | ICD-10-CM

## 2013-02-13 ENCOUNTER — Ambulatory Visit (INDEPENDENT_AMBULATORY_CARE_PROVIDER_SITE_OTHER): Payer: Self-pay | Admitting: Cardiothoracic Surgery

## 2013-02-13 ENCOUNTER — Encounter: Payer: Self-pay | Admitting: Cardiothoracic Surgery

## 2013-02-13 ENCOUNTER — Ambulatory Visit
Admission: RE | Admit: 2013-02-13 | Discharge: 2013-02-13 | Disposition: A | Discharge status: Home / Self Care | Payer: BC Managed Care – PPO | Source: Ambulatory Visit | Attending: Cardiothoracic Surgery | Admitting: Cardiothoracic Surgery

## 2013-02-13 ENCOUNTER — Encounter (HOSPITAL_COMMUNITY)
Admission: RE | Admit: 2013-02-13 | Discharge: 2013-02-13 | Disposition: A | Discharge status: Home / Self Care | Payer: BC Managed Care – PPO | Source: Ambulatory Visit | Attending: Cardiovascular Disease | Admitting: Cardiovascular Disease

## 2013-02-13 VITALS — Ht 71.0 in | Wt 161.0 lb

## 2013-02-13 DIAGNOSIS — K219 Gastro-esophageal reflux disease without esophagitis: Secondary | ICD-10-CM | POA: Insufficient documentation

## 2013-02-13 DIAGNOSIS — Z951 Presence of aortocoronary bypass graft: Secondary | ICD-10-CM | POA: Insufficient documentation

## 2013-02-13 DIAGNOSIS — I2510 Atherosclerotic heart disease of native coronary artery without angina pectoris: Secondary | ICD-10-CM

## 2013-02-13 DIAGNOSIS — D696 Thrombocytopenia, unspecified: Secondary | ICD-10-CM | POA: Insufficient documentation

## 2013-02-13 DIAGNOSIS — D62 Acute posthemorrhagic anemia: Secondary | ICD-10-CM | POA: Insufficient documentation

## 2013-02-13 DIAGNOSIS — I214 Non-ST elevation (NSTEMI) myocardial infarction: Secondary | ICD-10-CM | POA: Insufficient documentation

## 2013-02-13 DIAGNOSIS — Z7982 Long term (current) use of aspirin: Secondary | ICD-10-CM | POA: Insufficient documentation

## 2013-02-13 DIAGNOSIS — Z5189 Encounter for other specified aftercare: Secondary | ICD-10-CM | POA: Insufficient documentation

## 2013-02-13 NOTE — Progress Notes (Signed)
[address]       [place], [id number]             [phone]      [facility] Medical Record #[id number] Date of Birth: 04/24/1960  Referring: Wendall StadeNishan, Peter C, MD Primary Care: Lupita RaiderSHAW,KIMBERLEE, MD  Chief Complaint:   POST OP FOLLOW UP 01/16/2013  OPERATIVE REPORT  PREOPERATIVE DIAGNOSES: Recent non-ST segment elevation myocardial  infarction with 3-vessel coronary artery disease.  POSTOPERATIVE DIAGNOSES: Recent non-ST segment elevation myocardial  infarction with 3-vessel coronary artery disease.  SURGICAL PROCEDURE: Coronary artery bypass grafting x4 with left  internal mammary to the left anterior descending coronary artery,  reverse saphenous vein graft to the intermediate coronary artery,  reverse saphenous vein graft to the first obtuse marginal coronary  artery, reverse saphenous vein graft to the right coronary artery with  right thigh and calf endo vein harvesting.  SURGEON: Sheliah PlaneEdward Dallyn Bergland, MD  History of Present Illness:   Patient returns to office today for follow up after recent CABG, with acute presentation with MI. He is progressing post op appropriately. He has returned to limited work. Still gets tired easily, but had been use to exercise preop.  He has had no angina or chf post op. Wants to start riding bike soon.     Past Medical History  Diagnosis Date  . Heart burn   . Hyperlipidemia     statin intolerant  . Familial hyperlipidemia     LDL 212 on 01/12/13  . SEMI (subendocardial myocardial infarction)   . Anxiety   . Atypical chest pain   . GERD (gastroesophageal reflux disease)      History  Smoking status  . Never Smoker   Smokeless tobacco  . Never Used    History  Alcohol Use  . Yes    Comment: occasional     No Known Allergies  Current Outpatient Prescriptions  Medication Sig Dispense Refill  . acyclovir (ZOVIRAX) 200 MG capsule Take 400 mg by mouth at bedtime.      . ALPRAZolam (XANAX)  0.25 MG tablet Take 1 tablet (0.25 mg total) by mouth 2 (two) times daily as needed for anxiety.  10 tablet  0  . aspirin EC 325 MG EC tablet Take 1 tablet (325 mg total) by mouth daily.      Marland Kitchen. atorvastatin (LIPITOR) 40 MG tablet Take 1 tablet (40 mg total) by mouth daily at 6 PM.  30 tablet  1  . clonazePAM (KLONOPIN) 0.5 MG tablet Take 0.5 mg by mouth at bedtime.      Marland Kitchen. esomeprazole (NEXIUM) 40 MG capsule Take 40 mg by mouth daily at 12 noon.      . ferrous sulfate 325 (65 FE) MG tablet Take 1 tablet (325 mg total) by mouth daily with breakfast.  30 tablet  1  . folic acid (FOLVITE) 1 MG tablet Take 1 tablet (1 mg total) by mouth daily.  30 tablet  1  . loratadine (CLARITIN) 10 MG tablet Take 10 mg by mouth daily.      . metoprolol tartrate (LOPRESSOR) 25 MG tablet Take 1 tablet (25 mg total) by mouth 2 (two) times daily.  60 tablet  1  . omega-3 acid ethyl esters (LOVAZA) 1 G capsule Take 2 g by mouth daily.      . RESTASIS 0.05 % ophthalmic emulsion daily      . traMADol (ULTRAM) 50 MG tablet Take 2 tablets (  100 mg total) by mouth every 6 (six) hours as needed for moderate pain.  50 tablet  0   No current facility-administered medications for this visit.       Physical Exam: Ht 5\' 11"  (1.803 m)  Wt 161 lb (73.029 kg)  BMI 22.46 kg/m2  SpO2 99%  General appearance: alert, cooperative and no distress Neurologic: intact Heart: regular rate and rhythm, S1, S2 normal, no murmur, click, rub or gallop Lungs: clear to auscultation bilaterally Abdomen: soft, non-tender; bowel sounds normal; no masses,  no organomegaly Extremities: extremities normal, atraumatic, no cyanosis or edema and Homans sign is negative, no sign of DVT Wound: sternum stable well healed   Diagnostic Studies & Laboratory data:     Recent Radiology Findings:   Dg Chest 2 View  02/13/2013   CLINICAL DATA:  Status post CABG  EXAM: CHEST  2 VIEW  COMPARISON:  01/17/2013  FINDINGS: Cardiac silhouette within normal  limits. Patient is status post median sternotomy and coronary artery bypass grafting. A small left pleural effusion is identified in the lateral base of the left hemi thorax. Otherwise no further focal reason consolidation appreciated. There is mild prominence of the interstitial markings. There does not appear to be evidence of pulmonary edema. The osseous structures unremarkable. No focal infiltrates appreciated.  IMPRESSION: Small left pleural effusion. Interstitial markings likely represent chronic bronchitic changes. No focal infiltrates no further evidence of focal regions of consolidation.   Electronically Signed   By: Salome Holmes M.D.   On: 02/13/2013 16:04      Recent Lab Findings: Lab Results  Component Value Date   WBC 6.5 01/20/2013   HGB 8.6* 01/20/2013   HCT 25.8* 01/20/2013   PLT 316 01/20/2013   GLUCOSE 109* 01/20/2013   CHOL 273* 01/11/2013   TRIG 209* 01/11/2013   HDL 17* 01/11/2013   LDLCALC 214* 01/11/2013   ALT 47 01/14/2013   AST 36 01/14/2013   NA 139 01/20/2013   K 4.0 01/20/2013   CL 102 01/20/2013   CREATININE 1.15 01/20/2013   BUN 13 01/20/2013   CO2 28 01/20/2013   TSH 0.729 01/10/2013   INR 1.37 01/15/2013   HGBA1C 5.7* 01/15/2013      Assessment / Plan:     Progressing appropriately post op  To start cardiac rehab next week. Cautioned about starting riding mt bike or heavy lifting for three months On statin and beta blocker asa Plan fu 4 weeks       [name] MD      [address] [place] [address] Office [phone]   Beeper [phone]  02/13/2013 9:01 PM

## 2013-02-14 ENCOUNTER — Ambulatory Visit (INDEPENDENT_AMBULATORY_CARE_PROVIDER_SITE_OTHER): Payer: BC Managed Care – PPO | Admitting: *Deleted

## 2013-02-14 DIAGNOSIS — D649 Anemia, unspecified: Secondary | ICD-10-CM

## 2013-02-14 LAB — RETICULOCYTES
ABS Retic: 92 10*3/uL (ref 19.0–186.0)
RBC.: 4 MIL/uL — ABNORMAL LOW (ref 4.22–5.81)
Retic Ct Pct: 2.3 % (ref 0.4–2.3)

## 2013-02-14 LAB — HEMOGLOBIN: HEMOGLOBIN: 11 g/dL — AB (ref 13.0–17.0)

## 2013-02-14 LAB — FERRITIN: FERRITIN: 131 ng/mL (ref 22.0–322.0)

## 2013-02-14 LAB — HEMATOCRIT: HCT: 33.9 % — ABNORMAL LOW (ref 39.0–52.0)

## 2013-02-14 LAB — IRON: Iron: 48 ug/dL (ref 42–165)

## 2013-02-17 ENCOUNTER — Encounter (HOSPITAL_COMMUNITY): Payer: Self-pay

## 2013-02-17 ENCOUNTER — Encounter (HOSPITAL_COMMUNITY)
Admission: RE | Admit: 2013-02-17 | Discharge: 2013-02-17 | Disposition: A | Discharge status: Home / Self Care | Payer: BC Managed Care – PPO | Source: Ambulatory Visit | Attending: Cardiovascular Disease | Admitting: Cardiovascular Disease

## 2013-02-17 ENCOUNTER — Other Ambulatory Visit: Payer: Self-pay | Admitting: *Deleted

## 2013-02-17 DIAGNOSIS — G8918 Other acute postprocedural pain: Secondary | ICD-10-CM

## 2013-02-17 MED ORDER — TRAMADOL HCL 50 MG PO TABS: 100.0000 mg | ORAL_TABLET | Freq: Four times a day (QID) | ORAL | Status: DC | PRN

## 2013-02-17 NOTE — Progress Notes (Signed)
Pt started cardiac rehab today.  Pt tolerated light exercise without difficulty.  VSS, telemetry-NSR.  Asymptomatic. PHQ-0.  Pt exhibits no psychosocial barriers to exercise.  Although pt does seem a bit discouraged with his recent cardiac event and the pressure of being behind at work.  Pt did not return homework, so no quality of life scores to report.  Pt states he will return on next exercise session.  Pt oriented to exercise equipment and routine.  Understanding verbalized.

## 2013-02-19 ENCOUNTER — Encounter (HOSPITAL_COMMUNITY)
Admission: RE | Admit: 2013-02-19 | Discharge: 2013-02-19 | Disposition: A | Discharge status: Home / Self Care | Payer: BC Managed Care – PPO | Source: Ambulatory Visit | Attending: Cardiovascular Disease | Admitting: Cardiovascular Disease

## 2013-02-21 ENCOUNTER — Telehealth: Payer: Self-pay | Admitting: Cardiovascular Disease

## 2013-02-21 ENCOUNTER — Encounter (HOSPITAL_COMMUNITY)
Admission: RE | Admit: 2013-02-21 | Discharge: 2013-02-21 | Disposition: A | Discharge status: Home / Self Care | Payer: BC Managed Care – PPO | Source: Ambulatory Visit | Attending: Cardiovascular Disease | Admitting: Cardiovascular Disease

## 2013-02-21 NOTE — Telephone Encounter (Signed)
PER  DR NISHAN PT  TO CONT  FOLIC ACID  1 MG  AND  IRON  324 MG FOR  1 MORE MONTH CALLED PT  BACK  WHEN  WENT  TO REFILL MEDS   APPEARS BOTH ARE OVER THE  COUNTER NEEDING TO  DISCUSS WITH  PT  LEFT MESSAGE FOR  CALL  BACK .Andrew Sampson/CY

## 2013-02-21 NOTE — Telephone Encounter (Signed)
Per pt  BOTH   MEDS WERE IN PRESCRIPTION BOTTLES  PT  THOUGHT  HAD  1 REFILL  LEFT ON BOTH WILL CALL PHARMACY  AND IF  HAS  A ISSUE PHARMACISTS WILL CALL./CY

## 2013-02-21 NOTE — Telephone Encounter (Signed)
New message      Pt need to go over medications with nurse.  He is not clear on what he is to be taking.  Iron and folic acid is what he is confused about.

## 2013-02-24 ENCOUNTER — Encounter (HOSPITAL_COMMUNITY)
Admission: RE | Admit: 2013-02-24 | Discharge: 2013-02-24 | Disposition: A | Discharge status: Home / Self Care | Payer: BC Managed Care – PPO | Source: Ambulatory Visit | Attending: Cardiovascular Disease | Admitting: Cardiovascular Disease

## 2013-02-24 NOTE — Progress Notes (Signed)
Reviewed home exercise with pt today.  Pt plans to walk and ride bike (starting 2/5 and trail rides in March) at home for exercise.  Reviewed THR, pulse, RPE, sign and symptoms, and when to call 911 or MD.  Pt voiced understanding. Andrew PierceJessica Hawkins, MA, ACSM RCEP

## 2013-02-26 ENCOUNTER — Encounter (HOSPITAL_COMMUNITY): Payer: BC Managed Care – PPO

## 2013-02-28 ENCOUNTER — Encounter (HOSPITAL_COMMUNITY)
Admission: RE | Admit: 2013-02-28 | Discharge: 2013-02-28 | Disposition: A | Discharge status: Home / Self Care | Payer: BC Managed Care – PPO | Source: Ambulatory Visit | Attending: Cardiovascular Disease | Admitting: Cardiovascular Disease

## 2013-03-03 ENCOUNTER — Encounter (HOSPITAL_COMMUNITY): Payer: BC Managed Care – PPO

## 2013-03-05 ENCOUNTER — Encounter (HOSPITAL_COMMUNITY)
Admission: RE | Admit: 2013-03-05 | Discharge: 2013-03-05 | Disposition: A | Discharge status: Home / Self Care | Payer: BC Managed Care – PPO | Source: Ambulatory Visit | Attending: Cardiovascular Disease | Admitting: Cardiovascular Disease

## 2013-03-05 DIAGNOSIS — I214 Non-ST elevation (NSTEMI) myocardial infarction: Secondary | ICD-10-CM | POA: Insufficient documentation

## 2013-03-05 DIAGNOSIS — Z5189 Encounter for other specified aftercare: Secondary | ICD-10-CM | POA: Insufficient documentation

## 2013-03-05 DIAGNOSIS — I2510 Atherosclerotic heart disease of native coronary artery without angina pectoris: Secondary | ICD-10-CM | POA: Insufficient documentation

## 2013-03-05 DIAGNOSIS — Z951 Presence of aortocoronary bypass graft: Secondary | ICD-10-CM | POA: Insufficient documentation

## 2013-03-07 ENCOUNTER — Encounter (HOSPITAL_COMMUNITY): Payer: BC Managed Care – PPO

## 2013-03-07 ENCOUNTER — Encounter: Payer: Self-pay | Admitting: Cardiovascular Disease

## 2013-03-07 ENCOUNTER — Ambulatory Visit (INDEPENDENT_AMBULATORY_CARE_PROVIDER_SITE_OTHER): Payer: BC Managed Care – PPO | Admitting: Cardiovascular Disease

## 2013-03-07 VITALS — BP 119/73 | HR 66 | Ht 71.0 in | Wt 167.0 lb

## 2013-03-07 DIAGNOSIS — E785 Hyperlipidemia, unspecified: Secondary | ICD-10-CM

## 2013-03-07 DIAGNOSIS — Z79899 Other long term (current) drug therapy: Secondary | ICD-10-CM

## 2013-03-07 DIAGNOSIS — Z951 Presence of aortocoronary bypass graft: Secondary | ICD-10-CM

## 2013-03-07 DIAGNOSIS — F419 Anxiety disorder, unspecified: Secondary | ICD-10-CM

## 2013-03-07 DIAGNOSIS — F411 Generalized anxiety disorder: Secondary | ICD-10-CM

## 2013-03-07 NOTE — Assessment & Plan Note (Signed)
On statin for about 8 weeks f/u lipid and liver in 4 weeks and adjust as needed   Lab Results  Component Value Date   LDLCALC 214* 01/11/2013

## 2013-03-07 NOTE — Assessment & Plan Note (Signed)
Stable with no angina and good activity level.  Continue medical Rx Sternum well healed.  Stop folic acid and iron in 2 weeks  Increase Activity as tolerated Back to work

## 2013-03-07 NOTE — Assessment & Plan Note (Signed)
Much improved demeanor  Back to work and exercising

## 2013-03-07 NOTE — Patient Instructions (Signed)
Your physician recommends that you schedule a follow-up appointment in:   3 MONTHS [facility]  [facility]  Your physician recommends that you continue on your current medications as directed. Please refer to the Current Medication list given to you today.  Your physician recommends that you return for lab work [facility]: [facility]  1  OR  2     LIPID  LIVER  DX  272.4  V58.79

## 2013-03-07 NOTE — Progress Notes (Signed)
Patient ID: NOVAK STGERMAINE, male   DOB: [birth year], 53 y.o.   MRN: [id number] Andrew Sampson is a 53 yo white male with no known history of CAD. He presented to [facility] in December with a three week history on increasing episodes of substernal chest pain with radiation into his neck. He described the pain as a "burning" sensation. On the day of presentation the patient was biking riding on a local trail. He states he noticed increasing symptoms of chest discomfort, numbness in left arm, and a near syncopal episode. He presented to [facility] where he was admitted for observation. Serial cardiac enzymes were positive and the patient was transferred to [facility] for further care.  Subsequent cath showed 3VD with D1 being culprit for SEMI EF 45% 01/10/13   CABG by Dr [name]: 12/18  SURGICAL PROCEDURE: Coronary artery bypass grafting x4 with left  internal mammary to the left anterior descending coronary artery,  reverse saphenous vein graft to the intermediate coronary artery,  reverse saphenous vein graft to the first obtuse marginal coronary  artery, reverse saphenous vein graft to the right coronary artery with  right thigh and calf endo vein harvesting.   Anxiety and depression gone Ready to ride [place] bike on road.  Does not need rehab anymore   ROS: Denies fever, malais, weight loss, blurry vision, decreased visual acuity, cough, sputum, SOB, hemoptysis, pleuritic pain, palpitaitons, heartburn, abdominal pain, melena, lower extremity edema, claudication, or rash.  All other systems reviewed and negative  General: Affect appropriate Healthy:  appears stated age HEENT: normal Neck supple with no adenopathy JVP normal no bruits no thyromegaly Lungs clear with no wheezing and good diaphragmatic motion Sternum well healed  Heart:  S1/S2 no murmur, no rub, gallop or click PMI normal Abdomen: benighn, BS positve, no tenderness, no AAA no bruit.  No HSM or HJR Distal pulses intact  with no bruits No edema Neuro non-focal Skin warm and dry No muscular weakness   Current Outpatient Prescriptions  Medication Sig Dispense Refill  . acyclovir (ZOVIRAX) 200 MG capsule Take 400 mg by mouth at bedtime.      . ALPRAZolam (XANAX) 0.25 MG tablet Take 1 tablet (0.25 mg total) by mouth 2 (two) times daily as needed for anxiety.  10 tablet  0  . aspirin EC 325 MG EC tablet Take 1 tablet (325 mg total) by mouth daily.      Marland Kitchen atorvastatin (LIPITOR) 40 MG tablet Take 1 tablet (40 mg total) by mouth daily at 6 PM.  30 tablet  1  . clonazePAM (KLONOPIN) 0.5 MG tablet Take 0.5 mg by mouth at bedtime.      Marland Kitchen esomeprazole (NEXIUM) 40 MG capsule Take 40 mg by mouth daily at 12 noon.      . loratadine (CLARITIN) 10 MG tablet Take 10 mg by mouth daily.      . metoprolol tartrate (LOPRESSOR) 25 MG tablet Take 1 tablet (25 mg total) by mouth 2 (two) times daily.  60 tablet  1  . omega-3 acid ethyl esters (LOVAZA) 1 G capsule Take 2 g by mouth daily.      . RESTASIS 0.05 % ophthalmic emulsion daily      . traMADol (ULTRAM) 50 MG tablet Take 2 tablets (100 mg total) by mouth every 6 (six) hours as needed for moderate pain.  50 tablet  0   No current facility-administered medications for this visit.    Allergies  Review of patient's allergies  indicates no known allergies.  Electrocardiogram:  12/19 SR rate 84  Nonspecific ST/T wave changes   Assessment and Plan

## 2013-03-10 ENCOUNTER — Encounter (HOSPITAL_COMMUNITY): Payer: BC Managed Care – PPO

## 2013-03-12 ENCOUNTER — Encounter (HOSPITAL_COMMUNITY): Payer: BC Managed Care – PPO

## 2013-03-13 ENCOUNTER — Ambulatory Visit: Payer: BC Managed Care – PPO | Admitting: Cardiothoracic Surgery

## 2013-03-13 ENCOUNTER — Telehealth (HOSPITAL_COMMUNITY): Payer: Self-pay | Admitting: *Deleted

## 2013-03-14 ENCOUNTER — Encounter (HOSPITAL_COMMUNITY): Payer: BC Managed Care – PPO

## 2013-03-17 ENCOUNTER — Encounter (HOSPITAL_COMMUNITY): Payer: BC Managed Care – PPO

## 2013-03-19 ENCOUNTER — Other Ambulatory Visit: Payer: Self-pay | Admitting: *Deleted

## 2013-03-19 ENCOUNTER — Encounter (HOSPITAL_COMMUNITY): Payer: BC Managed Care – PPO

## 2013-03-19 ENCOUNTER — Telehealth: Payer: Self-pay | Admitting: Cardiovascular Disease

## 2013-03-19 MED ORDER — ATORVASTATIN CALCIUM 40 MG PO TABS
40.0000 mg | ORAL_TABLET | Freq: Every day | ORAL | Status: DC
Start: 2013-03-19 — End: 2013-03-27

## 2013-03-19 MED ORDER — ATORVASTATIN CALCIUM 40 MG PO TABS: 40.0000 mg | ORAL_TABLET | Freq: Every day | ORAL | Status: DC

## 2013-03-19 MED ORDER — METOPROLOL TARTRATE 25 MG PO TABS: 25.0000 mg | ORAL_TABLET | Freq: Two times a day (BID) | ORAL | Status: DC

## 2013-03-19 NOTE — Telephone Encounter (Signed)
PT  CALLED IS  REQUIRING  REFILLS ON   IRON .FOLIC  ACID ,METOPROLOL,AND LIPITOR DOES   HE  NEED TO CONTINUE   MEDS? ./CY

## 2013-03-19 NOTE — Telephone Encounter (Signed)
PER  DR NISHAN  CONT  WITH   METOPROLOL AND LIPITOR./CY

## 2013-03-19 NOTE — Telephone Encounter (Signed)
New message ° ° ° ° ° ° ° ° °Pt has questions about medications °

## 2013-03-21 ENCOUNTER — Encounter (HOSPITAL_COMMUNITY): Payer: BC Managed Care – PPO

## 2013-03-21 ENCOUNTER — Encounter: Payer: Self-pay | Admitting: Cardiovascular Disease

## 2013-03-24 ENCOUNTER — Encounter (HOSPITAL_COMMUNITY): Payer: BC Managed Care – PPO

## 2013-03-26 ENCOUNTER — Encounter (HOSPITAL_COMMUNITY): Payer: BC Managed Care – PPO

## 2013-03-27 ENCOUNTER — Encounter: Payer: Self-pay | Admitting: Cardiothoracic Surgery

## 2013-03-27 ENCOUNTER — Ambulatory Visit (INDEPENDENT_AMBULATORY_CARE_PROVIDER_SITE_OTHER): Payer: Self-pay | Admitting: Cardiothoracic Surgery

## 2013-03-27 VITALS — BP 116/74 | HR 72 | Resp 16 | Ht 71.0 in | Wt 168.0 lb

## 2013-03-27 DIAGNOSIS — Z951 Presence of aortocoronary bypass graft: Secondary | ICD-10-CM

## 2013-03-27 DIAGNOSIS — I2510 Atherosclerotic heart disease of native coronary artery without angina pectoris: Secondary | ICD-10-CM

## 2013-03-27 NOTE — Progress Notes (Signed)
Andrew Sampson  Medical Record #[id number] Date of Birth: 09/24/1960  Referring: Wendall StadeNishan, Peter C, MD Primary Care: Lupita RaiderSHAW,KIMBERLEE, MD  Chief Complaint:   POST OP FOLLOW UP 01/16/2013  OPERATIVE REPORT  PREOPERATIVE DIAGNOSES: Recent non-ST segment elevation myocardial  infarction with 3-vessel coronary artery disease.  POSTOPERATIVE DIAGNOSES: Recent non-ST segment elevation myocardial  infarction with 3-vessel coronary artery disease.  SURGICAL PROCEDURE: Coronary artery bypass grafting x4 with left  internal mammary to the left anterior descending coronary artery,  reverse saphenous vein graft to the intermediate coronary artery,  reverse saphenous vein graft to the first obtuse marginal coronary  artery, reverse saphenous vein graft to the right coronary artery with  right thigh and calf endo vein harvesting.  SURGEON: Sheliah PlaneEdward Cedra Villalon, MD  History of Present Illness:   Patient returns to office today for follow up after recent CABG, with acute presentation with MI. He is progressing post op appropriately. He has returned to full time work. Has returned to riding his bicycle on level ground, has left the cardiac rehabilitation program. He notes that recent cholesterol total had dropped to 158.       Past Medical History  Diagnosis Date  . Heart burn   . Hyperlipidemia     statin intolerant  . Familial hyperlipidemia     LDL 212 on 01/12/13  . SEMI (subendocardial myocardial infarction)   . Anxiety   . Atypical chest pain   . GERD (gastroesophageal reflux disease)      History  Smoking status  . Never Smoker   Smokeless tobacco  . Never Used    History  Alcohol Use  . Yes    Comment: occasional     No Known Allergies  Current Outpatient Prescriptions  Medication Sig Dispense Refill  . acyclovir (ZOVIRAX) 200 MG capsule Take 400 mg by mouth at bedtime.      Marland Kitchen. aspirin EC 325 MG EC tablet Take 1 tablet (325 mg total) by mouth daily.       Marland Kitchen. atorvastatin (LIPITOR) 40 MG tablet Take 1 tablet (40 mg total) by mouth daily at 6 PM.  30 tablet  11  . clonazePAM (KLONOPIN) 0.5 MG tablet Take 0.5 mg by mouth at bedtime.      Marland Kitchen. esomeprazole (NEXIUM) 40 MG capsule Take 40 mg by mouth daily at 12 noon.      . loratadine (CLARITIN) 10 MG tablet Take 10 mg by mouth daily.      . metoprolol tartrate (LOPRESSOR) 25 MG tablet Take 1 tablet (25 mg total) by mouth 2 (two) times daily.  60 tablet  11  . omega-3 acid ethyl esters (LOVAZA) 1 G capsule Take 2 g by mouth daily.      . RESTASIS 0.05 % ophthalmic emulsion daily       No current facility-administered medications for this visit.       Physical Exam: BP 116/74  Pulse 72  Resp 16  Ht 5\' 11"  (1.803 m)  Wt 168 lb (76.204 kg)  BMI 23.44 kg/m2  SpO2 98%  General appearance: alert, cooperative and no distress Neurologic: intact Heart: regular rate and rhythm, S1, S2 normal, no murmur, click, rub or gallop Lungs: clear to auscultation bilaterally Abdomen: soft, non-tender; bowel sounds normal; no masses,  no organomegaly Extremities: extremities normal, atraumatic, no cyanosis or edema and Homans sign is negative, no sign of DVT Wound: sternum stable well healed   Diagnostic  Studies & Laboratory data:     Recent Radiology Findings:   No results found.    Recent Lab Findings: Lab Results  Component Value Date   WBC 6.5 01/20/2013   HGB 11.0* 02/14/2013   HCT 33.9* 02/14/2013   PLT 316 01/20/2013   GLUCOSE 109* 01/20/2013   CHOL 273* 01/11/2013   TRIG 209* 01/11/2013   HDL 17* 01/11/2013   LDLCALC 214* 01/11/2013   ALT 47 01/14/2013   AST 36 01/14/2013   NA 139 01/20/2013   K 4.0 01/20/2013   CL 102 01/20/2013   CREATININE 1.15 01/20/2013   BUN 13 01/20/2013   CO2 28 01/20/2013   TSH 0.729 01/10/2013   INR 1.37 01/15/2013   HGBA1C 5.7* 01/15/2013      Assessment / Plan:   Progressing appropriately post op  On statin and beta blocker asa Plan fu 3  months Patient has appointment in the lipid clinic 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 He's concerned about specific followup in the future including repeat Dopplers and exercise stress test on a yearly basis. He will discuss these issues with cardiology.       [name] MD      [address] [place] [place] [address] Office [phone]   Beeper [phone]  03/27/2013 3:02 PM

## 2013-03-28 ENCOUNTER — Encounter (HOSPITAL_COMMUNITY): Payer: BC Managed Care – PPO

## 2013-03-31 ENCOUNTER — Encounter (HOSPITAL_COMMUNITY): Payer: BC Managed Care – PPO

## 2013-03-31 ENCOUNTER — Other Ambulatory Visit: Payer: BC Managed Care – PPO

## 2013-04-02 ENCOUNTER — Encounter (HOSPITAL_COMMUNITY): Payer: BC Managed Care – PPO

## 2013-04-04 ENCOUNTER — Ambulatory Visit (INDEPENDENT_AMBULATORY_CARE_PROVIDER_SITE_OTHER): Payer: BC Managed Care – PPO | Admitting: Pharmacist

## 2013-04-04 ENCOUNTER — Encounter (HOSPITAL_COMMUNITY): Payer: BC Managed Care – PPO

## 2013-04-04 VITALS — Wt 173.0 lb

## 2013-04-04 DIAGNOSIS — IMO0001 Reserved for inherently not codable concepts without codable children: Secondary | ICD-10-CM

## 2013-04-04 DIAGNOSIS — Z79899 Other long term (current) drug therapy: Secondary | ICD-10-CM

## 2013-04-04 DIAGNOSIS — E785 Hyperlipidemia, unspecified: Secondary | ICD-10-CM

## 2013-04-04 MED ORDER — CO Q-10 100 MG PO CAPS: 200.0000 mg | ORAL_CAPSULE | Freq: Every day | ORAL | Status: DC

## 2013-04-04 NOTE — Progress Notes (Signed)
Patient is a pleasant 53 y.o. WM referred to lipid clinic by Dr. Eden Emms given recent CABG x 4 (12/2012) in a patient who has failed multiple lipid lowering medications in the past.  His baseline LDL is ~ 210 mg/dL, and seems to be of genetic origin.  He was discharged from the hospital on atorvastatin 40 mg qd, and he has been tolerating this better than the agents he tried in the past.  There is some mild degree of muscle soreness, and he feels his endurance while mountain biking has been impaired.  He's not sure if his endurance issues are due to metoprolol use, atorvastatin use, or due to being deconditioned since his CABG.  He failed atorvastatin, zocor, crestor in the past due to muscle aches, and also failed Zetia in the past due to fatigue and weight gain according to his PCP notes Deboraha Sprang).  He does have some discomfort with atorvastatin, but given his cholesterol is "the best it's ever been" per patient, he wants to try and stay on this but take other measures to try and mitigate its side effects.  He is not on Co-Q 10, and it doesn't appear has ever had his Vitamin D level checked.  Studies have shown correcting low Vitamin D levels improve muscle weakness symptoms.  Patient has been on Lovaza 2 g/d for years, but just started atorvastatin 40 mg 12/2012 following CABG.  RF:  CABG x 4, age, low HDL - LDL goal < 70 mg/dL, or at least < 098 mg/dL  (11% reduction) given baseline LDL is 210 mg/dL Meds:  Atorvastatin 40 mg qd, Lovaza 2 g/d. Intolerant:  Simvastatin (myalgias), Crestor (myalgias), Atorvastatin (with PCP in past  - myalgias), Zetia (fatigue and weight gain).  Took niacin in past as well but this was stopped.  Social history:  Patient doesn't use tobacco products.  He is an Pensions consultant.  Family is from [place], he was born in [place]. Family history:  Father had PCI in his 29's.  Grandmother developed CAD at age 55 y.o. Exercise:  Very active.  He mountain bikes three days a week for ~ 2  hours. Diet:  Patient follows a healthy, low fat diet already.  Labs:   03/2013 ([facility]) TC 148, TG 137, HDL 26, LDL 95, non-HDL 122, LFTs normal (Atorvastatin 40 mg qd, Lovaza 2 g/d) 03/2013 - Our office after this visit:  Vitamin D 41 ng/mL (normal) 12/2012 (hospital-CABG)  TC 273, LDL 214, TG 209, HDL 17 (Lovaza 2 g/d)  Current Outpatient Prescriptions  Medication Sig Dispense Refill  . acyclovir (ZOVIRAX) 200 MG capsule Take 400 mg by mouth at bedtime.      Marland Kitchen aspirin EC 325 MG EC tablet Take 1 tablet (325 mg total) by mouth daily.      Marland Kitchen atorvastatin (LIPITOR) 40 MG tablet Take 1 tablet (40 mg total) by mouth daily at 6 PM.  30 tablet  11  . clonazePAM (KLONOPIN) 0.5 MG tablet Take 0.5 mg by mouth at bedtime.      Marland Kitchen esomeprazole (NEXIUM) 40 MG capsule Take 40 mg by mouth daily at 12 noon.      . loratadine (CLARITIN) 10 MG tablet Take 10 mg by mouth daily.      . metoprolol tartrate (LOPRESSOR) 25 MG tablet Take 1 tablet (25 mg total) by mouth 2 (two) times daily.  60 tablet  11  . omega-3 acid ethyl esters (LOVAZA) 1 G capsule Take 2 g by mouth daily.      Marland Kitchen  RESTASIS 0.05 % ophthalmic emulsion daily       No current facility-administered medications for this visit.   No Known Allergies  Family History  Problem Relation Age of Onset  . Heart attack Father   . Heart disease Father   . Hypertension Father   . Hyperlipidemia Father

## 2013-04-04 NOTE — Patient Instructions (Addendum)
1.  Start Co-Enzyme Q-10 today at dose of 200 mg daily. 2.  Continue atorvastatin 40 mg qd. 3.  Will get a Vitamin D level done today.   4.  Will call you next week and let you know if need to start supplement. 5.  Recheck NMR LipoProfile and hepatic panel in 3 months (fasting 06/30/13) to assess if LDL particle number is < 1000 nmol/L.  See Riki RuskJeremy 07/03/13 at 3:30

## 2013-04-04 NOTE — Assessment & Plan Note (Addendum)
Patient's LDL dropped from 214 mg/dL down to 95 mg/dL over the past few months since being on atorvastatin 40 mg qd.  This is the lowest he remembers his cholesterol being, so he wants to try and stay on atorvastatin if possible, but he is concerned about some soreness in his leg muscles.  He doesn't feel his endurance level while riding his mountain bike is as good, but not certain if it is due to statin, metoprolol, or deconditioning.  He is starting to push himself harder now, so hopefully as he gets back into shape this will get better.  Since he's had difficulty with statins/zetia in past, and already having some soreness with atoravastatin now, will add Co-Q 10 today, and also check a Vitamin D level.  Want to make sure he can stay on potent daily statin given his recent CABG, so if vitamin D is low, will start him on supplementation to hopefully improve muscle aches and keep him on daily statin.  Given his h/o elevated TG in past as well (~ TG 250 mg/dL), and excellent drop in LDL on statin, will check an NMR in patient to make sure there is no "hidden" lipoprotein risk we need to treat more aggressively.  Will check NMR / Liver in 3 months, and see me 3 days later.  Check Vitamin D-25 OH today, and call him with results. Plan: 1.  Start Co-Enzyme Q-10 today at dose of 200 mg daily. 2.  Continue atorvastatin 40 mg qd. 3.  Will get a Vitamin D level done today - Vitamin D level came back at 41 ng/mL which is normal.  Patient notified by phone. 4.  No vitamin D supplementation needed given Vitamin D level was normal. 5.  Recheck NMR LipoProfile and hepatic panel in 3 months (fasting 06/30/13) to assess if LDL particle number is < 1000 nmol/L.  See Riki RuskJeremy 07/03/13 at 3:30

## 2013-04-05 LAB — VITAMIN D 25 HYDROXY (VIT D DEFICIENCY, FRACTURES): VIT D 25 HYDROXY: 41 ng/mL (ref 30–89)

## 2013-04-07 ENCOUNTER — Encounter (HOSPITAL_COMMUNITY): Payer: BC Managed Care – PPO

## 2013-04-09 ENCOUNTER — Encounter (HOSPITAL_COMMUNITY): Payer: BC Managed Care – PPO

## 2013-04-11 ENCOUNTER — Encounter (HOSPITAL_COMMUNITY): Payer: BC Managed Care – PPO

## 2013-04-14 ENCOUNTER — Encounter (HOSPITAL_COMMUNITY): Payer: BC Managed Care – PPO

## 2013-04-16 ENCOUNTER — Encounter (HOSPITAL_COMMUNITY): Payer: BC Managed Care – PPO

## 2013-04-18 ENCOUNTER — Encounter (HOSPITAL_COMMUNITY): Payer: BC Managed Care – PPO

## 2013-04-21 ENCOUNTER — Encounter (HOSPITAL_COMMUNITY): Payer: BC Managed Care – PPO

## 2013-04-23 ENCOUNTER — Encounter (HOSPITAL_COMMUNITY): Payer: BC Managed Care – PPO

## 2013-04-25 ENCOUNTER — Encounter (HOSPITAL_COMMUNITY): Payer: BC Managed Care – PPO

## 2013-04-28 ENCOUNTER — Encounter (HOSPITAL_COMMUNITY): Payer: BC Managed Care – PPO

## 2013-04-30 ENCOUNTER — Encounter (HOSPITAL_COMMUNITY): Payer: BC Managed Care – PPO

## 2013-05-02 ENCOUNTER — Encounter (HOSPITAL_COMMUNITY): Payer: BC Managed Care – PPO

## 2013-05-05 ENCOUNTER — Encounter (HOSPITAL_COMMUNITY): Payer: BC Managed Care – PPO

## 2013-05-07 ENCOUNTER — Encounter (HOSPITAL_COMMUNITY): Payer: BC Managed Care – PPO

## 2013-05-09 ENCOUNTER — Encounter (HOSPITAL_COMMUNITY): Payer: BC Managed Care – PPO

## 2013-05-12 ENCOUNTER — Encounter (HOSPITAL_COMMUNITY): Payer: BC Managed Care – PPO

## 2013-05-14 ENCOUNTER — Encounter (HOSPITAL_COMMUNITY): Payer: BC Managed Care – PPO

## 2013-05-16 ENCOUNTER — Encounter (HOSPITAL_COMMUNITY): Payer: BC Managed Care – PPO

## 2013-05-19 ENCOUNTER — Encounter (HOSPITAL_COMMUNITY): Payer: BC Managed Care – PPO

## 2013-05-21 ENCOUNTER — Encounter (HOSPITAL_COMMUNITY): Payer: BC Managed Care – PPO

## 2013-05-23 ENCOUNTER — Encounter (HOSPITAL_COMMUNITY): Payer: BC Managed Care – PPO

## 2013-06-18 ENCOUNTER — Telehealth: Payer: Self-pay | Admitting: Cardiovascular Disease

## 2013-06-18 NOTE — Telephone Encounter (Signed)
PT  CALLED   TO  SEE  IF   HE NEEDED TO CONTINUE   TAKING  METOPROLOL IS  RUNNING OUT  OF MED AND  NEEDS  REFILL   INFORMED  PT  THAT  MED  IS  USUALLY  GIVEN  INDEFINITE  AFTER  BYPASS  PT  FEELS  LIKE   THIS  HAS  CHANGED  HIS METABOLISM  AND  ALSO  JUST  DOESN'T  FEEL  LIKE   HEART RATE  IS  WHERE  IT  SHOULD  BE  WITH   CERTAIN ACTIVITIES ( BIKE  RIDING SPECIFICALLY ) WILL  FORWARD TO DR NISHAN  FEden Emms REVIEW .Zack Seal/CY

## 2013-06-18 NOTE — Telephone Encounter (Signed)
LMTCB ./CY 

## 2013-06-18 NOTE — Telephone Encounter (Signed)
Have him cut lopressor back to once a day for 2 weeks then stop  F/U with me to check HR and blood pressure next available

## 2013-06-18 NOTE — Telephone Encounter (Signed)
LEFT  MESSAGE TO BACK./CY

## 2013-06-18 NOTE — Telephone Encounter (Signed)
Follow Up:  PT is returning Christine's call

## 2013-06-18 NOTE — Telephone Encounter (Signed)
Follow up     Pt has questions about whether you keep taking the one of his meds  Please.

## 2013-06-19 ENCOUNTER — Encounter: Payer: Self-pay | Admitting: Cardiothoracic Surgery

## 2013-06-19 ENCOUNTER — Telehealth: Payer: Self-pay | Admitting: *Deleted

## 2013-06-19 ENCOUNTER — Ambulatory Visit (INDEPENDENT_AMBULATORY_CARE_PROVIDER_SITE_OTHER): Payer: BC Managed Care – PPO | Admitting: Cardiothoracic Surgery

## 2013-06-19 VITALS — BP 113/69 | HR 66 | Resp 18 | Ht 71.0 in | Wt 173.0 lb

## 2013-06-19 DIAGNOSIS — Z951 Presence of aortocoronary bypass graft: Secondary | ICD-10-CM

## 2013-06-19 DIAGNOSIS — I2510 Atherosclerotic heart disease of native coronary artery without angina pectoris: Secondary | ICD-10-CM

## 2013-06-19 NOTE — Telephone Encounter (Signed)
Following up     Pt returning this offices called please give him a call back.

## 2013-06-19 NOTE — Telephone Encounter (Signed)
lmtcb ./cy 
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
## 2013-06-20 NOTE — Telephone Encounter (Signed)
SEE OTHER PHONE NOTE./CY 

## 2013-06-20 NOTE — Telephone Encounter (Signed)
LEFT MESSAGE  TO  CALL BACK./CY 

## 2013-06-24 NOTE — Telephone Encounter (Signed)
SEE OFFICE NOTE  FROM  DR [name]   PT  IS  AWARE OF  NEW  INTRUCTIONS RE LOPRESSOR./CY

## 2013-06-30 ENCOUNTER — Other Ambulatory Visit (INDEPENDENT_AMBULATORY_CARE_PROVIDER_SITE_OTHER): Payer: BC Managed Care – PPO

## 2013-06-30 DIAGNOSIS — Z79899 Other long term (current) drug therapy: Secondary | ICD-10-CM

## 2013-06-30 DIAGNOSIS — E785 Hyperlipidemia, unspecified: Secondary | ICD-10-CM

## 2013-06-30 LAB — HEPATIC FUNCTION PANEL
ALBUMIN: 4.2 g/dL (ref 3.5–5.2)
ALK PHOS: 50 U/L (ref 39–117)
ALT: 32 U/L (ref 0–53)
AST: 26 U/L (ref 0–37)
Bilirubin, Direct: 0 mg/dL (ref 0.0–0.3)
Total Bilirubin: 0.8 mg/dL (ref 0.2–1.2)
Total Protein: 6.9 g/dL (ref 6.0–8.3)

## 2013-07-01 ENCOUNTER — Telehealth: Payer: Self-pay | Admitting: Internal Medicine

## 2013-07-01 LAB — NMR LIPOPROFILE WITH LIPIDS
Cholesterol, Total: 117 mg/dL (ref <200)
HDL PARTICLE NUMBER: 18.8 umol/L — AB (ref >=30.5)
HDL SIZE: 8.7 nm — AB (ref >=9.2)
HDL-C: 21 mg/dL — ABNORMAL LOW (ref >=40)
LARGE VLDL-P: 2.3 nmol/L (ref <=2.7)
LDL (calc): 77 mg/dL (ref <100)
LDL Particle Number: 1151 nmol/L — ABNORMAL HIGH (ref <1000)
LDL Size: 20.8 nm (ref >20.5)
LP-IR Score: 60 — ABNORMAL HIGH (ref <=45)
Small LDL Particle Number: 456 nmol/L (ref <=527)
TRIGLYCERIDES: 94 mg/dL (ref <150)
VLDL Size: 46 nm (ref <=46.6)
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
## 2013-07-02 ENCOUNTER — Telehealth: Payer: Self-pay | Admitting: Cardiovascular Disease

## 2013-07-02 NOTE — Telephone Encounter (Signed)
PT AWARE OF LAB RESULTS./CY 

## 2013-07-02 NOTE — Telephone Encounter (Signed)
New message  ° ° °Returning call back to nurse.  °

## 2013-07-02 NOTE — Telephone Encounter (Signed)
Follow up ° ° ° ° °Returning a nurses call to get test results °

## 2013-07-03 ENCOUNTER — Ambulatory Visit (INDEPENDENT_AMBULATORY_CARE_PROVIDER_SITE_OTHER): Payer: BC Managed Care – PPO | Admitting: Pharmacist

## 2013-07-03 VITALS — Wt 173.0 lb

## 2013-07-03 DIAGNOSIS — E7849 Other hyperlipidemia: Secondary | ICD-10-CM

## 2013-07-03 DIAGNOSIS — Z79899 Other long term (current) drug therapy: Secondary | ICD-10-CM

## 2013-07-03 DIAGNOSIS — E785 Hyperlipidemia, unspecified: Secondary | ICD-10-CM

## 2013-07-03 MED ORDER — CO Q-10 100 MG PO CAPS: 400.0000 mg | ORAL_CAPSULE | Freq: Every day | ORAL | Status: DC

## 2013-07-03 NOTE — Assessment & Plan Note (Addendum)
LDL much improved from 214 to 77 mg/dL, and achieved a 54% LDL reduction which is primary goal.  His TC dropped > 50% and his LDL-P number is 1151 nmol/L.  Don't know what baseline LDL-P number is.  Would love to see this < 1000 nmol/L, but given how much higher than the average person his baseline cholesterol is, this may not be a reasonable target.  Given he has failed multiple statins + Zetia in the past, and given he is currently active and cholesterol much improved, will not make any further statin titration at this time.  Will increase Co-Q 10 to 400 mg daily in hopes of reducing muscle fatigue while bike riding. I think it is reasonable to d/c his Lovaza given TG and non-HDL are so well controlled, and due to the fact he is bruising and bleeding a lot more, and lives an active lifestyle.  If TG and LDL-P trend up in 4 months, may need to restart Lovaza at that time.  Patient agreeable to this.   Plan: 1.  Stop Lovaza for now. 2.  Increase Co-Enzyme to 400 mg daily. 3.  Continue lipitor 40 mg once daily. 4.  Take metoprolol tartrate 25 mg - 1/2 tablet in AM, and 1/2 tablet in PM (instead of 1 tablet once daily) 5.  Recheck cholesterol and liver in 4 months (11/03/13- fasting labs show up anytime after 7:30 am); see Riki Rusk 3days later on 11/06/13 at 4:00

## 2013-07-03 NOTE — Patient Instructions (Signed)
1.  Stop Lovaza for now. 2.  Increase Co-Enzyme to 400 mg daily. 3.  Continue lipitor 40 mg once daily. 4.  Recheck cholesterol and liver in 4 months (11/03/13- fasting labs show up anytime after 7:30 am); see Andrew Sampson 3days later on 11/06/13 at 4:00
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
## 2013-07-21 ENCOUNTER — Encounter (HOSPITAL_BASED_OUTPATIENT_CLINIC_OR_DEPARTMENT_OTHER): Payer: Self-pay | Admitting: *Deleted

## 2013-07-21 ENCOUNTER — Ambulatory Visit (HOSPITAL_BASED_OUTPATIENT_CLINIC_OR_DEPARTMENT_OTHER): Payer: BC Managed Care – PPO | Admitting: Anesthesiology

## 2013-07-21 ENCOUNTER — Encounter (HOSPITAL_BASED_OUTPATIENT_CLINIC_OR_DEPARTMENT_OTHER)
Admission: RE | Disposition: A | Discharge status: Home / Self Care | Payer: Self-pay | Source: Ambulatory Visit | Attending: Orthopedic Surgery

## 2013-07-21 ENCOUNTER — Encounter (HOSPITAL_BASED_OUTPATIENT_CLINIC_OR_DEPARTMENT_OTHER): Payer: BC Managed Care – PPO | Admitting: Anesthesiology

## 2013-07-21 ENCOUNTER — Ambulatory Visit (HOSPITAL_BASED_OUTPATIENT_CLINIC_OR_DEPARTMENT_OTHER)
Admission: RE | Admit: 2013-07-21 | Discharge: 2013-07-21 | Disposition: A | Discharge status: Home / Self Care | Payer: BC Managed Care – PPO | Source: Ambulatory Visit | Attending: Orthopedic Surgery | Admitting: Orthopedic Surgery

## 2013-07-21 DIAGNOSIS — Z951 Presence of aortocoronary bypass graft: Secondary | ICD-10-CM | POA: Insufficient documentation

## 2013-07-21 DIAGNOSIS — E785 Hyperlipidemia, unspecified: Secondary | ICD-10-CM | POA: Insufficient documentation

## 2013-07-21 DIAGNOSIS — S61409A Unspecified open wound of unspecified hand, initial encounter: Secondary | ICD-10-CM | POA: Insufficient documentation

## 2013-07-21 DIAGNOSIS — F411 Generalized anxiety disorder: Secondary | ICD-10-CM | POA: Insufficient documentation

## 2013-07-21 DIAGNOSIS — Y9289 Other specified places as the place of occurrence of the external cause: Secondary | ICD-10-CM | POA: Insufficient documentation

## 2013-07-21 DIAGNOSIS — K219 Gastro-esophageal reflux disease without esophagitis: Secondary | ICD-10-CM | POA: Insufficient documentation

## 2013-07-21 DIAGNOSIS — W268XXA Contact with other sharp object(s), not elsewhere classified, initial encounter: Secondary | ICD-10-CM | POA: Insufficient documentation

## 2013-07-21 DIAGNOSIS — I252 Old myocardial infarction: Secondary | ICD-10-CM | POA: Insufficient documentation

## 2013-07-21 DIAGNOSIS — Z7982 Long term (current) use of aspirin: Secondary | ICD-10-CM | POA: Insufficient documentation

## 2013-07-21 HISTORY — PX: I & D EXTREMITY: SHX5045

## 2013-07-21 LAB — POCT HEMOGLOBIN-HEMACUE: HEMOGLOBIN: 13.5 g/dL (ref 13.0–17.0)

## 2013-07-21 SURGERY — IRRIGATION AND DEBRIDEMENT EXTREMITY
Anesthesia: General | Site: Arm Lower | Laterality: Left

## 2013-07-21 MED ORDER — MEPERIDINE HCL 25 MG/ML IJ SOLN: 6.2500 mg | INTRAMUSCULAR | Status: DC | PRN

## 2013-07-21 MED ORDER — HYDROCODONE-ACETAMINOPHEN 10-325 MG PO TABS: 1.0000 | ORAL_TABLET | Freq: Four times a day (QID) | ORAL | Status: DC | PRN

## 2013-07-21 MED ORDER — OXYCODONE HCL 5 MG PO TABS: 5.0000 mg | ORAL_TABLET | Freq: Once | ORAL | Status: DC | PRN

## 2013-07-21 MED ORDER — FENTANYL CITRATE 0.05 MG/ML IJ SOLN
INTRAMUSCULAR | Status: DC | PRN
  Administered 2013-07-21: 100 ug via INTRAVENOUS

## 2013-07-21 MED ORDER — CIPROFLOXACIN HCL 750 MG PO TABS: 750.0000 mg | ORAL_TABLET | Freq: Two times a day (BID) | ORAL | Status: DC

## 2013-07-21 MED ORDER — MIDAZOLAM HCL 2 MG/2ML IJ SOLN
INTRAMUSCULAR | Status: AC
  Filled 2013-07-21: qty 2

## 2013-07-21 MED ORDER — MIDAZOLAM HCL 2 MG/2ML IJ SOLN: 1.0000 mg | INTRAMUSCULAR | Status: DC | PRN

## 2013-07-21 MED ORDER — FENTANYL CITRATE 0.05 MG/ML IJ SOLN
INTRAMUSCULAR | Status: AC
  Filled 2013-07-21: qty 2

## 2013-07-21 MED ORDER — OXYCODONE HCL 5 MG/5ML PO SOLN: 5.0000 mg | Freq: Once | ORAL | Status: DC | PRN

## 2013-07-21 MED ORDER — ONDANSETRON HCL 4 MG/2ML IJ SOLN
INTRAMUSCULAR | Status: DC | PRN
  Administered 2013-07-21: 4 mg via INTRAVENOUS

## 2013-07-21 MED ORDER — FENTANYL CITRATE 0.05 MG/ML IJ SOLN
50.0000 ug | INTRAMUSCULAR | Status: DC | PRN
  Administered 2013-07-21: 100 ug via INTRAVENOUS

## 2013-07-21 MED ORDER — PROPOFOL INFUSION 10 MG/ML OPTIME
INTRAVENOUS | Status: DC | PRN
  Administered 2013-07-21: 75 ug/kg/min via INTRAVENOUS

## 2013-07-21 MED ORDER — ONDANSETRON HCL 4 MG/2ML IJ SOLN: 4.0000 mg | Freq: Once | INTRAMUSCULAR | Status: DC | PRN

## 2013-07-21 MED ORDER — SODIUM CHLORIDE 0.9 % IN NEBU
INHALATION_SOLUTION | RESPIRATORY_TRACT | Status: AC
  Filled 2013-07-21: qty 6

## 2013-07-21 MED ORDER — LACTATED RINGERS IV SOLN
INTRAVENOUS | Status: DC
  Administered 2013-07-21: 10 mL/h via INTRAVENOUS
  Administered 2013-07-21: 17:00:00 via INTRAVENOUS

## 2013-07-21 MED ORDER — BUPIVACAINE-EPINEPHRINE (PF) 0.5% -1:200000 IJ SOLN
INTRAMUSCULAR | Status: DC | PRN
  Administered 2013-07-21: 30 mL via PERINEURAL

## 2013-07-21 MED ORDER — CHLORHEXIDINE GLUCONATE 4 % EX LIQD: 60.0000 mL | Freq: Once | CUTANEOUS | Status: DC

## 2013-07-21 MED ORDER — HYDROMORPHONE HCL PF 1 MG/ML IJ SOLN: 0.2500 mg | INTRAMUSCULAR | Status: DC | PRN

## 2013-07-21 SURGICAL SUPPLY — 51 items
BAG DECANTER FOR FLEXI CONT (MISCELLANEOUS) IMPLANT
BLADE MINI RND TIP GREEN BEAV (BLADE) IMPLANT
BLADE SURG 15 STRL LF DISP TIS (BLADE) ×1 IMPLANT
BLADE SURG 15 STRL SS (BLADE) ×2
BNDG CMPR 9X4 STRL LF SNTH (GAUZE/BANDAGES/DRESSINGS) ×1
BNDG COHESIVE 1X5 TAN STRL LF (GAUZE/BANDAGES/DRESSINGS) IMPLANT
BNDG COHESIVE 3X5 TAN STRL LF (GAUZE/BANDAGES/DRESSINGS) ×1 IMPLANT
BNDG ESMARK 4X9 LF (GAUZE/BANDAGES/DRESSINGS) ×1 IMPLANT
BNDG GAUZE ELAST 4 BULKY (GAUZE/BANDAGES/DRESSINGS) ×2 IMPLANT
CHLORAPREP W/TINT 26ML (MISCELLANEOUS) ×2 IMPLANT
CORDS BIPOLAR (ELECTRODE) ×2 IMPLANT
COVER MAYO STAND STRL (DRAPES) ×2 IMPLANT
COVER TABLE BACK 60X90 (DRAPES) ×2 IMPLANT
CUFF TOURNIQUET SINGLE 18IN (TOURNIQUET CUFF) ×2 IMPLANT
DRAPE EXTREMITY T 121X128X90 (DRAPE) ×2 IMPLANT
DRAPE SURG 17X23 STRL (DRAPES) ×2 IMPLANT
DRSG KUZMA FLUFF (GAUZE/BANDAGES/DRESSINGS) ×2 IMPLANT
GAUZE PACKING IODOFORM 1/4X15 (GAUZE/BANDAGES/DRESSINGS) ×1 IMPLANT
GAUZE SPONGE 4X4 12PLY STRL (GAUZE/BANDAGES/DRESSINGS) ×2 IMPLANT
GAUZE XEROFORM 1X8 LF (GAUZE/BANDAGES/DRESSINGS) ×2 IMPLANT
GLOVE BIO SURGEON STRL SZ 6.5 (GLOVE) ×1 IMPLANT
GLOVE BIO SURGEON STRL SZ7.5 (GLOVE) ×2 IMPLANT
GLOVE BIOGEL PI IND STRL 8 (GLOVE) ×1 IMPLANT
GLOVE BIOGEL PI IND STRL 8.5 (GLOVE) ×1 IMPLANT
GLOVE BIOGEL PI INDICATOR 8 (GLOVE) ×1
GLOVE BIOGEL PI INDICATOR 8.5 (GLOVE) ×1
GLOVE SURG ORTHO 8.0 STRL STRW (GLOVE) ×2 IMPLANT
GOWN STRL REUS W/ TWL LRG LVL3 (GOWN DISPOSABLE) IMPLANT
GOWN STRL REUS W/ TWL XL LVL3 (GOWN DISPOSABLE) IMPLANT
GOWN STRL REUS W/TWL LRG LVL3 (GOWN DISPOSABLE)
GOWN STRL REUS W/TWL XL LVL3 (GOWN DISPOSABLE) ×4 IMPLANT
LOOP VESSEL MAXI BLUE (MISCELLANEOUS) IMPLANT
NEEDLE 27GAX1X1/2 (NEEDLE) IMPLANT
NS IRRIG 1000ML POUR BTL (IV SOLUTION) ×2 IMPLANT
PACK BASIN DAY SURGERY FS (CUSTOM PROCEDURE TRAY) ×2 IMPLANT
PAD CAST 3X4 CTTN HI CHSV (CAST SUPPLIES) ×1 IMPLANT
PADDING CAST ABS 4INX4YD NS (CAST SUPPLIES)
PADDING CAST ABS COTTON 4X4 ST (CAST SUPPLIES) ×1 IMPLANT
PADDING CAST COTTON 3X4 STRL (CAST SUPPLIES) ×2
SPLINT PLASTER CAST XFAST 3X15 (CAST SUPPLIES) IMPLANT
SPLINT PLASTER XTRA FASTSET 3X (CAST SUPPLIES) ×10
STOCKINETTE 4X48 STRL (DRAPES) ×2 IMPLANT
SUT VICRYL RAPID 5 0 P 3 (SUTURE) IMPLANT
SUT VICRYL RAPIDE 4/0 PS 2 (SUTURE) ×1 IMPLANT
SWAB COLLECTION DEVICE MRSA (MISCELLANEOUS) ×1 IMPLANT
SYR BULB 3OZ (MISCELLANEOUS) ×2 IMPLANT
SYR CONTROL 10ML LL (SYRINGE) IMPLANT
TOWEL OR 17X24 6PK STRL BLUE (TOWEL DISPOSABLE) ×3 IMPLANT
TUBE ANAEROBIC SPECIMEN COL (MISCELLANEOUS) ×1 IMPLANT
TUBE FEEDING 5FR 15 INCH (TUBING) IMPLANT
UNDERPAD 30X30 INCONTINENT (UNDERPADS AND DIAPERS) ×2 IMPLANT

## 2013-07-21 NOTE — Anesthesia Preprocedure Evaluation (Addendum)
Anesthesia Evaluation  Patient identified by MRN, date of birth, ID band Patient awake    Reviewed: Allergy & Precautions, H&P , NPO status , Patient's Chart, lab work & pertinent test results  Airway Mallampati: I TM Distance: >3 FB Neck ROM: Full    Dental   Pulmonary          Cardiovascular + Past MI and + CABG     Neuro/Psych    GI/Hepatic GERD-  Controlled,  Endo/Other    Renal/GU      Musculoskeletal   Abdominal   Peds  Hematology   Anesthesia Other Findings   Reproductive/Obstetrics                           Anesthesia Physical Anesthesia Plan  ASA: III  Anesthesia Plan: Regional   Post-op Pain Management:    Induction: Intravenous  Airway Management Planned: Natural Airway  Additional Equipment:   Intra-op Plan:   Post-operative Plan:   Informed Consent: I have reviewed the patients History and Physical, chart, labs and discussed the procedure including the risks, benefits and alternatives for the proposed anesthesia with the patient or authorized representative who has indicated his/her understanding and acceptance.     Plan Discussed with: CRNA and Surgeon  Anesthesia Plan Comments:        Anesthesia Quick Evaluation

## 2013-07-21 NOTE — Brief Op Note (Signed)
07/21/2013  5:06 PM  PATIENT:  Andrew Sampson  53 y.o. male  PRE-OPERATIVE DIAGNOSIS:  laceration left forearm  POST-OPERATIVE DIAGNOSIS:  laceration left forearm  PROCEDURE:  Procedure(s): IRRIGATION AND DEBRIDEMENT EXTREMITY (Left)  SURGEON:  Surgeon(s) and Role:    * Nicki ReaperGary R Depaul Arizpe, MD - Primary  PHYSICIAN ASSISTANT:   ASSISTANTS: none   ANESTHESIA:   regional and IV sedation  EBL:  Total I/O In: 1000 [I.V.:1000] Out: -   BLOOD ADMINISTERED:none  DRAINS: none   LOCAL MEDICATIONS USED:  NONE  SPECIMEN:  Source of Specimen:  cultures  DISPOSITION OF SPECIMEN:  PATHOLOGY  COUNTS:  YES  TOURNIQUET:   Total Tourniquet Time Documented: Upper Arm (Left) - 22 minutes Total: Upper Arm (Left) - 22 minutes   DICTATION: .Other Dictation: Dictation Number [id number]  PLAN OF CARE: Discharge to home after PACU  PATIENT DISPOSITION:  PACU - hemodynamically stable.

## 2013-07-21 NOTE — Anesthesia Postprocedure Evaluation (Signed)
Anesthesia Post Note  Patient: Andrew PiqueJohn S Sampson  Procedure(s) Performed: Procedure(s) (LRB): IRRIGATION AND DEBRIDEMENT EXTREMITY (Left)  Anesthesia type: general  Patient location: PACU  Post pain: Pain level controlled  Post assessment: Patient's Cardiovascular Status Stable  Last Vitals:  Filed Vitals:   07/21/13 1715  BP: 128/76  Pulse: 82  Temp:   Resp: 13    Post vital signs: Reviewed and stable  Level of consciousness: sedated  Complications: No apparent anesthesia complications

## 2013-07-21 NOTE — Transfer of Care (Signed)
Immediate Anesthesia Transfer of Care Note  Patient: Andrew PiqueJohn S Sampson  Procedure(s) Performed: Procedure(s): IRRIGATION AND DEBRIDEMENT EXTREMITY (Left)  Patient Location: PACU  Anesthesia Type:MAC and Regional  Level of Consciousness: awake, alert  and oriented  Airway & Oxygen Therapy: Patient Spontanous Breathing and Patient connected to face mask oxygen  Post-op Assessment: Report given to PACU RN and Post -op Vital signs reviewed and stable  Post vital signs: Reviewed and stable  Complications: No apparent anesthesia complications

## 2013-07-21 NOTE — Discharge Instructions (Signed)
°  Post Anesthesia Home Care Instructions ° °Activity: °Get plenty of rest for the remainder of the day. A responsible adult should stay with you for 24 hours following the procedure.  °For the next 24 hours, DO NOT: °-Drive a car °-Operate machinery °-Drink alcoholic beverages °-Take any medication unless instructed by your physician °-Make any legal decisions or sign important papers. ° °Meals: °Start with liquid foods such as gelatin or soup. Progress to regular foods as tolerated. Avoid greasy, spicy, heavy foods. If nausea and/or vomiting occur, drink only clear liquids until the nausea and/or vomiting subsides. Call your physician if vomiting continues. ° °Special Instructions/Symptoms: °Your throat may feel dry or sore from the anesthesia or the breathing tube placed in your throat during surgery. If this causes discomfort, gargle with warm salt water. The discomfort should disappear within 24 hours. ° ° ° ° °Regional Anesthesia Blocks ° °1. Numbness or the inability to move the "blocked" extremity may last from 3-48 hours after placement. The length of time depends on the medication injected and your individual response to the medication. If the numbness is not going away after 48 hours, call your surgeon. ° °2. The extremity that is blocked will need to be protected until the numbness is gone and the  Strength has returned. Because you cannot feel it, you will need to take extra care to avoid injury. Because it may be weak, you may have difficulty moving it or using it. You may not know what position it is in without looking at it while the block is in effect. ° °3. For blocks in the legs and feet, returning to weight bearing and walking needs to be done carefully. You will need to wait until the numbness is entirely gone and the strength has returned. You should be able to move your leg and foot normally before you try and bear weight or walk. You will need someone to be with you when you first try to  ensure you do not fall and possibly risk injury. ° °4. Bruising and tenderness at the needle site are common side effects and will resolve in a few days. ° °5. Persistent numbness or new problems with movement should be communicated to the surgeon or the [facility] ([phone])/ [facility] ([phone]). ° ° ° ° °Hand Center Instructions °Hand Surgery ° °Wound Care: °Keep your hand elevated above the level of your heart.  Do not allow it to dangle by your side.  Keep the dressing dry and do not remove it unless your doctor advises you to do so.  He will usually change it at the time of your post-op visit.  Moving your fingers is advised to stimulate circulation but will depend on the site of your surgery.  If you have a splint applied, your doctor will advise you regarding movement. ° °Activity: °Do not drive or operate machinery today.  Rest today and then you may return to your normal activity and work as indicated by your physician. ° °Diet:  °Drink liquids today or eat a light diet.  You may resume a regular diet tomorrow.   ° °General expectations: °Pain for two to three days. °Fingers may become slightly swollen. ° °Call your doctor if any of the following occur: °Severe pain not relieved by pain medication. °Elevated temperature. °Dressing soaked with blood. °Inability to move fingers. °White or bluish color to fingers. °

## 2013-07-21 NOTE — Progress Notes (Signed)
Assisted Dr. Ossey with left, ultrasound guided, supraclavicular block. Side rails up, monitors on throughout procedure. See vital signs in flow sheet. Tolerated Procedure well. 

## 2013-07-21 NOTE — Op Note (Signed)
Dictation Number [id number]

## 2013-07-21 NOTE — Anesthesia Procedure Notes (Addendum)
Anesthesia Regional Block:  Supraclavicular block  Pre-Anesthetic Checklist: ,, timeout performed, Correct Patient, Correct Site, Correct Laterality, Correct Procedure, Correct Position, site marked, Risks and benefits discussed,  Surgical consent,  Pre-op evaluation,  At surgeon's request and post-op pain management  Laterality: Left  Prep: chloraprep       Needles:   Needle Type: Echogenic Stimulator Needle     Needle Length: 9cm 9 cm Needle Gauge: 21 and 21 G    Additional Needles:  Procedures: ultrasound guided (picture in chart) and nerve stimulator Supraclavicular block  Nerve Stimulator or Paresthesia:  Response: 0.4 mA,   Additional Responses:   Narrative:  Start time: 07/21/2013 3:00 PM End time: 07/21/2013 3:12 PM Injection made incrementally with aspirations every 5 mL.  Performed by: Personally  Anesthesiologist: Arta BruceKevin Ossey MD  Additional Notes: Monitors applied. Patient sedated. Sterile prep and drape,hand hygiene and sterile gloves were used. Relevant anatomy identified.Needle position confirmed.Local anesthetic injected incrementally after negative aspiration. Local anesthetic spread visualized around nerve(s). Vascular puncture avoided. No complications. Image printed for medical record.The patient tolerated the procedure well.        Procedure Name: MAC Date/Time: 07/21/2013 4:25 PM Performed by: Zenia ResidesPAYNE, Caprisha Bridgett D Pre-anesthesia Checklist: Patient identified, Timeout performed, Emergency Drugs available, Suction available and Patient being monitored
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
## 2013-07-22 ENCOUNTER — Encounter (HOSPITAL_BASED_OUTPATIENT_CLINIC_OR_DEPARTMENT_OTHER): Payer: Self-pay | Admitting: Orthopedic Surgery
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
## 2013-07-24 LAB — WOUND CULTURE: CULTURE: NO GROWTH

## 2013-07-26 LAB — ANAEROBIC CULTURE

## 2013-08-15 LAB — FUNGUS CULTURE W SMEAR: FUNGAL SMEAR: NONE SEEN

## 2013-09-02 LAB — AFB CULTURE WITH SMEAR (NOT AT ARMC): ACID FAST SMEAR: NONE SEEN

## 2013-09-11 ENCOUNTER — Encounter: Payer: Self-pay | Admitting: Cardiovascular Disease

## 2013-09-11 ENCOUNTER — Ambulatory Visit (INDEPENDENT_AMBULATORY_CARE_PROVIDER_SITE_OTHER): Payer: BC Managed Care – PPO | Admitting: Cardiovascular Disease

## 2013-09-11 VITALS — BP 100/84 | HR 68 | Ht 71.0 in | Wt 169.0 lb

## 2013-09-11 DIAGNOSIS — Z951 Presence of aortocoronary bypass graft: Secondary | ICD-10-CM

## 2013-09-11 MED ORDER — METOPROLOL SUCCINATE ER 25 MG PO TB24: 25.0000 mg | ORAL_TABLET | Freq: Every day | ORAL | Status: DC

## 2013-09-11 NOTE — Patient Instructions (Signed)
Your physician has recommended you make the following change in your medication: stop taking Lopressor and start taking Toprol 25 mg daily  Your physician wants you to follow-up in: 6 months. You will receive a reminder letter in the mail two months in advance. If you don't receive a letter, please call our office to schedule the follow-up appointment.
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
## 2013-09-11 NOTE — Assessment & Plan Note (Signed)
Stable with no angina and good activity level.  Continue medical Rx Change to toprol 25 mg daily
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
## 2013-09-22 ENCOUNTER — Observation Stay (HOSPITAL_COMMUNITY): Payer: BC Managed Care – PPO

## 2013-09-22 ENCOUNTER — Observation Stay (HOSPITAL_COMMUNITY)
Admission: EM | Admit: 2013-09-22 | Discharge: 2013-09-23 | Disposition: A | Discharge status: Home / Self Care | Payer: BC Managed Care – PPO | Attending: Internal Medicine | Admitting: Internal Medicine

## 2013-09-22 ENCOUNTER — Telehealth: Payer: Self-pay | Admitting: Cardiovascular Disease

## 2013-09-22 ENCOUNTER — Emergency Department (HOSPITAL_COMMUNITY): Payer: BC Managed Care – PPO

## 2013-09-22 DIAGNOSIS — F419 Anxiety disorder, unspecified: Secondary | ICD-10-CM

## 2013-09-22 DIAGNOSIS — K219 Gastro-esophageal reflux disease without esophagitis: Secondary | ICD-10-CM | POA: Diagnosis not present

## 2013-09-22 DIAGNOSIS — I2510 Atherosclerotic heart disease of native coronary artery without angina pectoris: Secondary | ICD-10-CM | POA: Diagnosis not present

## 2013-09-22 DIAGNOSIS — Z8249 Family history of ischemic heart disease and other diseases of the circulatory system: Secondary | ICD-10-CM | POA: Insufficient documentation

## 2013-09-22 DIAGNOSIS — I252 Old myocardial infarction: Secondary | ICD-10-CM | POA: Diagnosis not present

## 2013-09-22 DIAGNOSIS — R0789 Other chest pain: Secondary | ICD-10-CM | POA: Diagnosis not present

## 2013-09-22 DIAGNOSIS — F411 Generalized anxiety disorder: Secondary | ICD-10-CM | POA: Insufficient documentation

## 2013-09-22 DIAGNOSIS — E7800 Pure hypercholesterolemia, unspecified: Secondary | ICD-10-CM | POA: Insufficient documentation

## 2013-09-22 DIAGNOSIS — I679 Cerebrovascular disease, unspecified: Secondary | ICD-10-CM

## 2013-09-22 DIAGNOSIS — I214 Non-ST elevation (NSTEMI) myocardial infarction: Secondary | ICD-10-CM

## 2013-09-22 DIAGNOSIS — G458 Other transient cerebral ischemic attacks and related syndromes: Secondary | ICD-10-CM

## 2013-09-22 DIAGNOSIS — G459 Transient cerebral ischemic attack, unspecified: Secondary | ICD-10-CM

## 2013-09-22 DIAGNOSIS — Z7982 Long term (current) use of aspirin: Secondary | ICD-10-CM | POA: Insufficient documentation

## 2013-09-22 DIAGNOSIS — R4182 Altered mental status, unspecified: Secondary | ICD-10-CM | POA: Diagnosis present

## 2013-09-22 DIAGNOSIS — I6509 Occlusion and stenosis of unspecified vertebral artery: Secondary | ICD-10-CM | POA: Diagnosis not present

## 2013-09-22 DIAGNOSIS — Z951 Presence of aortocoronary bypass graft: Secondary | ICD-10-CM | POA: Diagnosis not present

## 2013-09-22 DIAGNOSIS — I6529 Occlusion and stenosis of unspecified carotid artery: Secondary | ICD-10-CM | POA: Diagnosis not present

## 2013-09-22 DIAGNOSIS — I658 Occlusion and stenosis of other precerebral arteries: Secondary | ICD-10-CM | POA: Diagnosis not present

## 2013-09-22 DIAGNOSIS — Z789 Other specified health status: Secondary | ICD-10-CM

## 2013-09-22 DIAGNOSIS — I059 Rheumatic mitral valve disease, unspecified: Secondary | ICD-10-CM

## 2013-09-22 DIAGNOSIS — E785 Hyperlipidemia, unspecified: Secondary | ICD-10-CM | POA: Diagnosis not present

## 2013-09-22 DIAGNOSIS — E7849 Other hyperlipidemia: Secondary | ICD-10-CM

## 2013-09-22 HISTORY — DX: Transient cerebral ischemic attack, unspecified: G45.9

## 2013-09-22 LAB — COMPREHENSIVE METABOLIC PANEL
ALK PHOS: 65 U/L (ref 39–117)
ALT: 30 U/L (ref 0–53)
ANION GAP: 15 (ref 5–15)
AST: 21 U/L (ref 0–37)
Albumin: 4.4 g/dL (ref 3.5–5.2)
BUN: 22 mg/dL (ref 6–23)
CO2: 23 mEq/L (ref 19–32)
Calcium: 9.4 mg/dL (ref 8.4–10.5)
Chloride: 104 mEq/L (ref 96–112)
Creatinine, Ser: 1.17 mg/dL (ref 0.50–1.35)
GFR calc Af Amer: 80 mL/min — ABNORMAL LOW (ref >90)
GFR calc non Af Amer: 69 mL/min — ABNORMAL LOW (ref >90)
GLUCOSE: 111 mg/dL — AB (ref 70–99)
POTASSIUM: 4.5 meq/L (ref 3.7–5.3)
Sodium: 142 mEq/L (ref 137–147)
TOTAL PROTEIN: 7.3 g/dL (ref 6.0–8.3)
Total Bilirubin: 0.3 mg/dL (ref 0.3–1.2)

## 2013-09-22 LAB — I-STAT CHEM 8, ED
BUN: 22 mg/dL (ref 6–23)
CALCIUM ION: 1.19 mmol/L (ref 1.12–1.23)
Chloride: 105 mEq/L (ref 96–112)
Creatinine, Ser: 1.3 mg/dL (ref 0.50–1.35)
GLUCOSE: 109 mg/dL — AB (ref 70–99)
HEMATOCRIT: 49 % (ref 39.0–52.0)
HEMOGLOBIN: 16.7 g/dL (ref 13.0–17.0)
Potassium: 4.2 mEq/L (ref 3.7–5.3)
Sodium: 140 mEq/L (ref 137–147)
TCO2: 25 mmol/L (ref 0–100)

## 2013-09-22 LAB — CBC
HCT: 46.3 % (ref 39.0–52.0)
HEMOGLOBIN: 15.4 g/dL (ref 13.0–17.0)
MCH: 30 pg (ref 26.0–34.0)
MCHC: 33.3 g/dL (ref 30.0–36.0)
MCV: 90.1 fL (ref 78.0–100.0)
Platelets: 231 10*3/uL (ref 150–400)
RBC: 5.14 MIL/uL (ref 4.22–5.81)
RDW: 12.4 % (ref 11.5–15.5)
WBC: 6.1 10*3/uL (ref 4.0–10.5)

## 2013-09-22 LAB — URINALYSIS, ROUTINE W REFLEX MICROSCOPIC
BILIRUBIN URINE: NEGATIVE
Glucose, UA: NEGATIVE mg/dL
HGB URINE DIPSTICK: NEGATIVE
Ketones, ur: NEGATIVE mg/dL
Leukocytes, UA: NEGATIVE
Nitrite: NEGATIVE
Protein, ur: NEGATIVE mg/dL
SPECIFIC GRAVITY, URINE: 1.024 (ref 1.005–1.030)
UROBILINOGEN UA: 0.2 mg/dL (ref 0.0–1.0)
pH: 6 (ref 5.0–8.0)

## 2013-09-22 LAB — RAPID URINE DRUG SCREEN, HOSP PERFORMED
AMPHETAMINES: NOT DETECTED
Barbiturates: NOT DETECTED
Benzodiazepines: NOT DETECTED
COCAINE: NOT DETECTED
OPIATES: NOT DETECTED
Tetrahydrocannabinol: NOT DETECTED

## 2013-09-22 LAB — DIFFERENTIAL
Basophils Absolute: 0 10*3/uL (ref 0.0–0.1)
Basophils Relative: 1 % (ref 0–1)
EOS ABS: 0.1 10*3/uL (ref 0.0–0.7)
EOS PCT: 2 % (ref 0–5)
LYMPHS ABS: 1.7 10*3/uL (ref 0.7–4.0)
Lymphocytes Relative: 27 % (ref 12–46)
MONO ABS: 0.5 10*3/uL (ref 0.1–1.0)
Monocytes Relative: 7 % (ref 3–12)
NEUTROS PCT: 63 % (ref 43–77)
Neutro Abs: 3.9 10*3/uL (ref 1.7–7.7)

## 2013-09-22 LAB — TROPONIN I

## 2013-09-22 LAB — APTT: aPTT: 26 seconds (ref 24–37)

## 2013-09-22 LAB — I-STAT TROPONIN, ED: Troponin i, poc: 0 ng/mL (ref 0.00–0.08)

## 2013-09-22 LAB — PROTIME-INR
INR: 1.09 (ref 0.00–1.49)
Prothrombin Time: 14.1 seconds (ref 11.6–15.2)

## 2013-09-22 LAB — CBG MONITORING, ED: GLUCOSE-CAPILLARY: 113 mg/dL — AB (ref 70–99)

## 2013-09-22 LAB — ETHANOL

## 2013-09-22 MED ORDER — ASPIRIN EC 325 MG PO TBEC
325.0000 mg | DELAYED_RELEASE_TABLET | Freq: Every day | ORAL | Status: DC
  Administered 2013-09-22 – 2013-09-23 (×2): 325 mg via ORAL
  Filled 2013-09-22 (×2): qty 1

## 2013-09-22 MED ORDER — ACETAMINOPHEN 325 MG PO TABS
650.0000 mg | ORAL_TABLET | Freq: Four times a day (QID) | ORAL | Status: DC | PRN
Start: 2013-09-22 — End: 2013-09-23

## 2013-09-22 MED ORDER — ATORVASTATIN CALCIUM 40 MG PO TABS
40.0000 mg | ORAL_TABLET | Freq: Every day | ORAL | Status: DC
  Administered 2013-09-22 – 2013-09-23 (×2): 40 mg via ORAL
  Filled 2013-09-22 (×3): qty 1

## 2013-09-22 MED ORDER — CLONAZEPAM 0.5 MG PO TABS: 0.5000 mg | ORAL_TABLET | Freq: Three times a day (TID) | ORAL | Status: DC | PRN

## 2013-09-22 MED ORDER — PANTOPRAZOLE SODIUM 40 MG PO TBEC
40.0000 mg | DELAYED_RELEASE_TABLET | Freq: Every day | ORAL | Status: DC
  Administered 2013-09-22 – 2013-09-23 (×2): 40 mg via ORAL
  Filled 2013-09-22 (×2): qty 1

## 2013-09-22 MED ORDER — SODIUM CHLORIDE 0.9 % IJ SOLN
3.0000 mL | Freq: Two times a day (BID) | INTRAMUSCULAR | Status: DC
  Administered 2013-09-22 (×2): 3 mL via INTRAVENOUS
  Filled 2013-09-22: qty 3

## 2013-09-22 MED ORDER — METOPROLOL TARTRATE 12.5 MG HALF TABLET
12.5000 mg | ORAL_TABLET | Freq: Two times a day (BID) | ORAL | Status: DC
  Administered 2013-09-22 – 2013-09-23 (×3): 12.5 mg via ORAL
  Filled 2013-09-22 (×3): qty 1

## 2013-09-22 MED ORDER — MORPHINE SULFATE 2 MG/ML IJ SOLN
2.0000 mg | INTRAMUSCULAR | Status: DC | PRN
Start: 2013-09-22 — End: 2013-09-23

## 2013-09-22 MED ORDER — ONDANSETRON HCL 4 MG PO TABS: 4.0000 mg | ORAL_TABLET | Freq: Four times a day (QID) | ORAL | Status: DC | PRN

## 2013-09-22 MED ORDER — HEPARIN SODIUM (PORCINE) 5000 UNIT/ML IJ SOLN
5000.0000 [IU] | Freq: Three times a day (TID) | INTRAMUSCULAR | Status: DC
  Administered 2013-09-22 – 2013-09-23 (×2): 5000 [IU] via SUBCUTANEOUS
  Filled 2013-09-22 (×2): qty 1

## 2013-09-22 MED ORDER — IBUPROFEN 400 MG PO TABS
400.0000 mg | ORAL_TABLET | Freq: Four times a day (QID) | ORAL | Status: DC | PRN
  Filled 2013-09-22: qty 1

## 2013-09-22 MED ORDER — ONDANSETRON HCL 4 MG/2ML IJ SOLN: 4.0000 mg | Freq: Four times a day (QID) | INTRAMUSCULAR | Status: DC | PRN

## 2013-09-22 MED ORDER — ACETAMINOPHEN 650 MG RE SUPP: 650.0000 mg | Freq: Four times a day (QID) | RECTAL | Status: DC | PRN

## 2013-09-22 MED ORDER — CO Q-10 100 MG PO CAPS: 400.0000 mg | ORAL_CAPSULE | Freq: Every day | ORAL | Status: DC

## 2013-09-22 NOTE — Telephone Encounter (Signed)
LMTCB.  NOTED  , WILL FORWARD  TO   DR Eden Emms  FOR REVIEW .Zack Seal

## 2013-09-22 NOTE — ED Notes (Signed)
Patient transported to X-ray 
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
## 2013-09-22 NOTE — ED Provider Notes (Signed)
Medical screening examination/treatment/procedure(s) were conducted as a shared visit with non-physician practitioner(s) and myself.  I personally evaluated the patient during the encounter.   EKG Interpretation   Date/Time:  Monday September 22 2013 10:12:21 EDT Ventricular Rate:  71 PR Interval:  188 QRS Duration: 89 QT Interval:  396 QTC Calculation: 430 R Axis:   93 Text Interpretation:  Sinus rhythm Borderline right axis deviation  Baseline wander in lead(s) I III aVL No significant change since last  tracing Confirmed by Bebe Shaggy  MD, Aloria Looper ([id number]) on 09/22/2013 10:28:04 AM        Joya Gaskins, MD 09/22/13 1524

## 2013-09-22 NOTE — H&P (Signed)
[facility] History and Physical  Andrew Sampson VWU:[id number] DOB: 10-Sep-1960 DOA: 09/22/2013  Referring physician: Emergency Department PCP: [name], MD  Specialists:   Chief Complaint: L sided numbness  HPI: Andrew Sampson is a 53 y.o. male  With a hx of of CAD s/p CABG who noted sudden onset L arm numbness followed shortly by L sided facial droop and slurred speech that started around 0900 on day of admit. Sx gradually resolved, however intermittently recurred. Pt currently asymptomatic. Pt denied chest pain, sob, or diaphoresis. Has been taking ASA daily. Initial CT head in ED unremarkable. Given presentation, hospitalist consulted for TIA/CVA work up.  Review of Systems:  Per above, the remainder of the 10pt ros reviewed and are neg  Past Medical History  Diagnosis Date  . Heart burn   . Hyperlipidemia     statin intolerant  . Familial hyperlipidemia     LDL 212 on 01/12/13  . SEMI (subendocardial myocardial infarction)   . Anxiety   . Atypical chest pain   . GERD (gastroesophageal reflux disease)    Past Surgical History  Procedure Laterality Date  . Cervical spine surgery  approx 2011    C3,C4, C5 diskectomy  . Tonsillectomy    . Coronary artery bypass graft N/A 01/15/2013    Procedure: CORONARY ARTERY BYPASS GRAFTING (CABG) TIMES FOUR USING LEFT INTERNAL MAMMARY ARTERY AND RIGHT SAPHENOUS LEG VEIN HARVESTED ENDOSCOPICALLY;  Surgeon: [name], MD;  Location: [facility] OR;  Service: Open Heart Surgery;  Laterality: N/A;  . Intraoperative transesophageal echocardiogram N/A 01/15/2013    Procedure: INTRAOPERATIVE TRANSESOPHAGEAL ECHOCARDIOGRAM;  Surgeon: [name], MD;  Location: [facility] OR;  Service: Open Heart Surgery;  Laterality: N/A;  . I&d extremity Left 07/21/2013    Procedure: IRRIGATION AND DEBRIDEMENT EXTREMITY;  Surgeon: [name], MD;  Location: [facility];  Service: Orthopedics;  Laterality: Left;   Social History:  reports that  he has never smoked. He has never used smokeless tobacco. He reports that he drinks alcohol. He reports that he does not use illicit drugs.  where does patient live--home, ALF, SNF? and with whom if at home?  Can patient participate in ADLs?  Allergies  Allergen Reactions  . Statins     Simvastatin (myalgias), Crestor (myalgias), Lipitor (with PCP in past had myalgias - tolerating this well currently)  . Zetia [Ezetimibe]     Muscle aches    Family History  Problem Relation Age of Onset  . Heart attack Father   . Heart disease Father   . Hypertension Father   . Hyperlipidemia Father     (be sure to complete)  Prior to Admission medications   Medication Sig Start Date End Date Taking? Authorizing Provider  acyclovir (ZOVIRAX) 200 MG capsule Take 400 mg by mouth at bedtime.   Yes Historical Provider, MD  aspirin EC 325 MG EC tablet Take 1 tablet (325 mg total) by mouth daily. 01/20/13  Yes Wayne E Gold, PA-C  atorvastatin (LIPITOR) 40 MG tablet Take 1 tablet (40 mg total) by mouth daily at 6 PM. 03/19/13  Yes [name], MD  clonazePAM (KLONOPIN) 0.5 MG tablet Take 0.5 mg by mouth at bedtime as needed for anxiety.    Yes Historical Provider, MD  Coenzyme Q10 (CO Q-10) 100 MG CAPS Take 400 mg by mouth daily. 07/03/13  Yes [name], MD  esomeprazole (NEXIUM) 40 MG capsule Take 40 mg by mouth daily at 12 noon.  Yes Historical Provider, MD  ibuprofen (ADVIL,MOTRIN) 200 MG tablet Take 400 mg by mouth every 6 (six) hours as needed for moderate pain.   Yes Historical Provider, MD  loratadine (CLARITIN) 10 MG tablet Take 10 mg by mouth daily as needed for allergies.    Yes Historical Provider, MD  metoprolol tartrate (LOPRESSOR) 25 MG tablet Take 12.5 mg by mouth 2 (two) times daily.   Yes Historical Provider, MD  RESTASIS 0.05 % ophthalmic emulsion Place 1 drop into both eyes at bedtime. daily 11/19/12  Yes Historical Provider, MD   Physical Exam: Filed Vitals:   09/22/13 1115  09/22/13 1130 09/22/13 1151 09/22/13 1200  BP: 121/78 128/75  120/79  Pulse: 68 79  65  Temp:   99.1 F (37.3 C)   TempSrc:      Resp: 16 27    SpO2: 99% 97%  98%     General:  Awake, in nad  Eyes: PERRL B  ENT: membranes moist, dentition fair  Neck: trachea midline,neck supple  Cardiovascular: regular,s 1, s2  Respiratory: normal resp effort, no wheezing  Abdomen: soft, nondistended  Skin: perfused, no clubbing  Musculoskeletal: perfused,no clubbing  Psychiatric: mood/effect normal//no auditory/visual hallucinations  Neurologic: cn2-12 intact, strength/sensation intact  Labs on Admission:  Basic Metabolic Panel:  Recent Labs Lab 09/22/13 1100 09/22/13 1117  NA 142 140  K 4.5 4.2  CL 104 105  CO2 23  --   GLUCOSE 111* 109*  BUN 22 22  CREATININE 1.17 1.30  CALCIUM 9.4  --    Liver Function Tests:  Recent Labs Lab 09/22/13 1100  AST 21  ALT 30  ALKPHOS 65  BILITOT 0.3  PROT 7.3  ALBUMIN 4.4   No results found for this basename: LIPASE, AMYLASE,  in the last 168 hours No results found for this basename: AMMONIA,  in the last 168 hours CBC:  Recent Labs Lab 09/22/13 1100 09/22/13 1117  WBC 6.1  --   NEUTROABS 3.9  --   HGB 15.4 16.7  HCT 46.3 49.0  MCV 90.1  --   PLT 231  --    Cardiac Enzymes: No results found for this basename: CKTOTAL, CKMB, CKMBINDEX, TROPONINI,  in the last 168 hours  BNP (last 3 results)  Recent Labs  01/10/13 1642  PROBNP 25.7   CBG:  Recent Labs Lab 09/22/13 1127  GLUCAP 113*    Radiological Exams on Admission: Ct Head Wo Contrast  09/22/2013   CLINICAL DATA:  LEFT facial weakness and numbness which began earlier today, altered mental status  EXAM: CT HEAD WITHOUT CONTRAST  TECHNIQUE: Contiguous axial images were obtained from the base of the skull through the vertex without intravenous contrast.  COMPARISON:  None  FINDINGS: Normal ventricular morphology.  No midline shift or mass effect.  Normal  appearance of brain parenchyma.  No intracranial hemorrhage, mass lesion, or acute infarction.  Visualized paranasal sinuses and mastoid air cells clear.  Bones unremarkable.  IMPRESSION: No acute intracranial abnormalities.   Electronically Signed   By: Ulyses Southward M.D.   On: 09/22/2013 10:49    EKG: Independently reviewed. NSR  Assessment/Plan Active Problems:   Atypical chest pain   Hyperlipidemia   S/P CABG x 4   Cerebral vascular disease   TIA (transient ischemic attack)  1. TIA 1. Admit to med-tele 2. Cont on ASA per home regimen 3. MRI/MRA brain, 2d echo, carotid dopplers 4. If pos for CVA, would consult Stroke Team 5. Check AM  lipids and A1c 2. CAD 1. Stable 2. No chest pains, however given L sided arm numbness, will follow serial trop 3. EKG unremarkable 4. Cont home meds 3. HLD 1. Cont statin 4. DVT prophylaxis 1. Heparin subQ  Code Status: Full Family Communication: Pt and wife in room Disposition Plan: Pending   Time spent:  CHIU, Scheryl Marten [facility] Pager [phone]  If 7PM-7AM, please contact night-coverage [email or web address] Password [facility] 09/22/2013, 12:34 PM

## 2013-09-22 NOTE — ED Notes (Signed)
Called Vascular for update on pt, pt taken to MRI after leaving vascular, MRI notified, pt to be brought back to ED after MRI, estimated timeframe 45 mins, 4N updated

## 2013-09-22 NOTE — ED Provider Notes (Signed)
EKG Interpretation  Date/Time:  Monday September 22 2013 10:12:21 EDT Ventricular Rate:  71 PR Interval:  188 QRS Duration: 89 QT Interval:  396 QTC Calculation: 430 R Axis:   93 Text Interpretation:  Sinus rhythm Borderline right axis deviation Baseline wander in lead(s) I III aVL No significant change since last tracing Confirmed by Bebe Shaggy  MD, Dorinda Hill ([id number]) on 09/22/2013 10:28:04 AM       Patient seen/examined in the Emergency Department in conjunction with Midlevel Provider Mathis Fare Patient reports left UE weakness earlier at home now improved Exam : awake/alert, no arm/leg drift, no facial droop Plan: CT imaging and admit for TIA   tPA in stroke considered but not given due to:  Symptoms resolved     Joya Gaskins, MD 09/22/13 1040

## 2013-09-22 NOTE — ED Notes (Signed)
Floor aware pt is being transported to 4 N

## 2013-09-22 NOTE — Progress Notes (Signed)
*  PRELIMINARY RESULTS* Vascular Ultrasound Carotid Duplex (Doppler) has been completed.  Preliminary findings: Bilateral 60-79% ICA stenosis. Antegrade vertebral flow.  Farrel Demark, RDMS, RVT  09/22/2013, 3:26 PM

## 2013-09-22 NOTE — ED Notes (Signed)
Pt in Xray, 4 hour neuro completed upon return to room

## 2013-09-22 NOTE — Telephone Encounter (Signed)
FYI     Per wife pt just had a Stroke and is on his way to [facility].  Per wife pt thinks it's due to medication.  Per wife give him a call if any question.

## 2013-09-22 NOTE — ED Notes (Signed)
Pt in from home via [facility], per report pt started to have trouble with speech onset today @ 9:15 lasting 5 mins with L arm weakness, pt A&O x4, follows commands, speaks in complete sentences, pt takes 500 mg ASA daily, NSR in route

## 2013-09-22 NOTE — Progress Notes (Signed)
Pt arrived to unit at 1735 from ED. Assessment performed. Pt up ad lib. No distress noted

## 2013-09-22 NOTE — Progress Notes (Signed)
  Echocardiogram 2D Echocardiogram has been performed.  Cathie Beams 09/22/2013, 3:15 PM

## 2013-09-23 ENCOUNTER — Observation Stay (HOSPITAL_COMMUNITY): Payer: BC Managed Care – PPO

## 2013-09-23 DIAGNOSIS — F411 Generalized anxiety disorder: Secondary | ICD-10-CM

## 2013-09-23 DIAGNOSIS — E785 Hyperlipidemia, unspecified: Secondary | ICD-10-CM

## 2013-09-23 DIAGNOSIS — G458 Other transient cerebral ischemic attacks and related syndromes: Secondary | ICD-10-CM

## 2013-09-23 DIAGNOSIS — I6529 Occlusion and stenosis of unspecified carotid artery: Secondary | ICD-10-CM

## 2013-09-23 DIAGNOSIS — I679 Cerebrovascular disease, unspecified: Secondary | ICD-10-CM

## 2013-09-23 LAB — COMPREHENSIVE METABOLIC PANEL
ALBUMIN: 3.9 g/dL (ref 3.5–5.2)
ALT: 26 U/L (ref 0–53)
AST: 18 U/L (ref 0–37)
Alkaline Phosphatase: 58 U/L (ref 39–117)
Anion gap: 12 (ref 5–15)
BILIRUBIN TOTAL: 0.7 mg/dL (ref 0.3–1.2)
BUN: 16 mg/dL (ref 6–23)
CHLORIDE: 101 meq/L (ref 96–112)
CO2: 26 mEq/L (ref 19–32)
CREATININE: 1.17 mg/dL (ref 0.50–1.35)
Calcium: 9.5 mg/dL (ref 8.4–10.5)
GFR calc Af Amer: 80 mL/min — ABNORMAL LOW (ref >90)
GFR calc non Af Amer: 69 mL/min — ABNORMAL LOW (ref >90)
Glucose, Bld: 96 mg/dL (ref 70–99)
Potassium: 4.2 mEq/L (ref 3.7–5.3)
Sodium: 139 mEq/L (ref 137–147)
TOTAL PROTEIN: 6.7 g/dL (ref 6.0–8.3)

## 2013-09-23 LAB — CBC
HEMATOCRIT: 42.8 % (ref 39.0–52.0)
Hemoglobin: 14.4 g/dL (ref 13.0–17.0)
MCH: 29.7 pg (ref 26.0–34.0)
MCHC: 33.6 g/dL (ref 30.0–36.0)
MCV: 88.2 fL (ref 78.0–100.0)
Platelets: 238 10*3/uL (ref 150–400)
RBC: 4.85 MIL/uL (ref 4.22–5.81)
RDW: 12.5 % (ref 11.5–15.5)
WBC: 6.4 10*3/uL (ref 4.0–10.5)

## 2013-09-23 LAB — LIPID PANEL
CHOL/HDL RATIO: 6.1 ratio
CHOLESTEROL: 128 mg/dL (ref 0–200)
HDL: 21 mg/dL — ABNORMAL LOW (ref >39)
LDL Cholesterol: 83 mg/dL (ref 0–99)
TRIGLYCERIDES: 122 mg/dL (ref <150)
VLDL: 24 mg/dL (ref 0–40)

## 2013-09-23 LAB — TROPONIN I: Troponin I: <0.3 ng/mL (ref <0.30)

## 2013-09-23 LAB — HEMOGLOBIN A1C
Hgb A1c MFr Bld: 6.2 % — ABNORMAL HIGH (ref <5.7)
MEAN PLASMA GLUCOSE: 131 mg/dL — AB (ref <117)

## 2013-09-23 MED ORDER — IOHEXOL 350 MG/ML SOLN
50.0000 mL | Freq: Once | INTRAVENOUS | Status: AC | PRN
  Administered 2013-09-23: 50 mL via INTRAVENOUS

## 2013-09-23 NOTE — Consult Note (Signed)
Referring Physician: Elisabeth Pigeon    Chief Complaint: TIA  HPI:                                                                                                                                         CASMER YEPIZ is an 53 y.o. male brought to ED on 09/22/13 after he noted sudden onset of left arm numbness followed by left facial droop and slurred speech. Due to wife also noting slurred speech he called EMS. These started around 0900 AM.  On arrival patients symptoms had resolved. This am patient states he had two more symptoms of left facial heaviness that lasted for about 3 minutes and resolved. Currently patient is asymptomatic.   Previous carotid doppler in 12/2012 --right 40% to 59% ICA stenosis ,  Left 1-39%   Date last known well: Date: 09/22/2013 Time last known well: Time: 09:00 tPA Given: No: out of window  Past Medical History  Diagnosis Date  . Heart burn   . Hyperlipidemia     statin intolerant  . Familial hyperlipidemia     LDL 212 on 01/12/13  . SEMI (subendocardial myocardial infarction)   . Anxiety   . Atypical chest pain   . GERD (gastroesophageal reflux disease)     Past Surgical History  Procedure Laterality Date  . Cervical spine surgery  approx 2011    C3,C4, C5 diskectomy  . Tonsillectomy    . Coronary artery bypass graft N/A 01/15/2013    Procedure: CORONARY ARTERY BYPASS GRAFTING (CABG) TIMES FOUR USING LEFT INTERNAL MAMMARY ARTERY AND RIGHT SAPHENOUS LEG VEIN HARVESTED ENDOSCOPICALLY;  Surgeon: [name], MD;  Location: [facility] OR;  Service: Open Heart Surgery;  Laterality: N/A;  . Intraoperative transesophageal echocardiogram N/A 01/15/2013    Procedure: INTRAOPERATIVE TRANSESOPHAGEAL ECHOCARDIOGRAM;  Surgeon: [name], MD;  Location: [facility] OR;  Service: Open Heart Surgery;  Laterality: N/A;  . I&d extremity Left 07/21/2013    Procedure: IRRIGATION AND DEBRIDEMENT EXTREMITY;  Surgeon: [name], MD;  Location:  SURGERY CENTER;  Service:  Orthopedics;  Laterality: Left;    Family History  Problem Relation Age of Onset  . Heart attack Father   . Heart disease Father   . Hypertension Father   . Hyperlipidemia Father    Social History:  reports that he has never smoked. He has never used smokeless tobacco. He reports that he drinks alcohol. He reports that he does not use illicit drugs.  Allergies:  Allergies  Allergen Reactions  . Statins     Simvastatin (myalgias), Crestor (myalgias), Lipitor (with PCP in past had myalgias - tolerating this well currently)  . Zetia [Ezetimibe]     Muscle aches    Medications:  Prior to Admission:  Prescriptions prior to admission  Medication Sig Dispense Refill  . acyclovir (ZOVIRAX) 200 MG capsule Take 400 mg by mouth at bedtime.      Marland Kitchen aspirin EC 325 MG EC tablet Take 1 tablet (325 mg total) by mouth daily.      Marland Kitchen atorvastatin (LIPITOR) 40 MG tablet Take 1 tablet (40 mg total) by mouth daily at 6 PM.  30 tablet  11  . clonazePAM (KLONOPIN) 0.5 MG tablet Take 0.5 mg by mouth at bedtime as needed for anxiety.       . Coenzyme Q10 (CO Q-10) 100 MG CAPS Take 400 mg by mouth daily.    0  . esomeprazole (NEXIUM) 40 MG capsule Take 40 mg by mouth daily at 12 noon.      Marland Kitchen ibuprofen (ADVIL,MOTRIN) 200 MG tablet Take 400 mg by mouth every 6 (six) hours as needed for moderate pain.      Marland Kitchen loratadine (CLARITIN) 10 MG tablet Take 10 mg by mouth daily as needed for allergies.       . metoprolol tartrate (LOPRESSOR) 25 MG tablet Take 12.5 mg by mouth 2 (two) times daily.      . RESTASIS 0.05 % ophthalmic emulsion Place 1 drop into both eyes at bedtime. daily       Scheduled: . aspirin EC  325 mg Oral Daily  . atorvastatin  40 mg Oral q1800  . metoprolol tartrate  12.5 mg Oral BID  . pantoprazole  40 mg Oral Daily  . sodium chloride  3 mL Intravenous Q12H    ROS:                                                                                                                                        History obtained from the patient  General ROS: negative for - chills, fatigue, fever, night sweats, weight gain or weight loss Psychological ROS: negative for - behavioral disorder, hallucinations, memory difficulties, mood swings or suicidal ideation Ophthalmic ROS: negative for - blurry vision, double vision, eye pain or loss of vision ENT ROS: negative for - epistaxis, nasal discharge, oral lesions, sore throat, tinnitus or vertigo Allergy and Immunology ROS: negative for - hives or itchy/watery eyes Hematological and Lymphatic ROS: negative for - bleeding problems, bruising or swollen lymph nodes Endocrine ROS: negative for - galactorrhea, hair pattern changes, polydipsia/polyuria or temperature intolerance Respiratory ROS: negative for - cough, hemoptysis, shortness of breath or wheezing Cardiovascular ROS: negative for - chest pain, dyspnea on exertion, edema or irregular heartbeat Gastrointestinal ROS: negative for - abdominal pain, diarrhea, hematemesis, nausea/vomiting or stool incontinence Genito-Urinary ROS: negative for - dysuria, hematuria, incontinence or urinary frequency/urgency Musculoskeletal ROS: negative for - joint swelling or muscular weakness Neurological ROS: as noted in HPI Dermatological ROS: negative for rash and skin lesion changes  Neurologic Examination:  Blood pressure 143/86, pulse 64, temperature 99 F (37.2 C), temperature source Oral, resp. rate 16, height  (1.803 m), weight 75.751 kg (167 lb), SpO2 97.00%.   General: Mental Status: Alert, oriented, thought content appropriate.  Speech fluent without evidence of aphasia.  Able to follow 3 step commands without difficulty. Cranial Nerves: II: Discs flat bilaterally; Visual  fields grossly normal, pupils equal, round, reactive to light and accommodation III,IV, VI: ptosis not present, extra-ocular motions intact bilaterally V,VII: smile symmetric, facial light touch sensation normal bilaterally VIII: hearing normal bilaterally IX,X: gag reflex present XI: bilateral shoulder shrug XII: midline tongue extension without atrophy or fasciculations  Motor: Right : Upper extremity   5/5    Left:     Upper extremity   5/5  Lower extremity   5/5     Lower extremity   5/5 Tone and bulk:normal tone throughout; no atrophy noted Sensory: Pinprick and light touch intact throughout, bilaterally Deep Tendon Reflexes:  Right: Upper Extremity   Left: Upper extremity   biceps (C-5 to C-6) 2/4   biceps (C-5 to C-6) 2/4 tricep (C7) 2/4    triceps (C7) 2/4 Brachioradialis (C6) 2/4  Brachioradialis (C6) 2/4  Lower Extremity Lower Extremity  quadriceps (L-2 to L-4) 2/4   quadriceps (L-2 to L-4) 2/4 Achilles (S1) 2/4   Achilles (S1) 2/4  Plantars: Right: downgoing   Left: downgoing Cerebellar: normal finger-to-nose,  normal heel-to-shin test Gait: not tested CV: pulses palpable throughout    Lab Results: Basic Metabolic Panel:  Recent Labs Lab 09/22/13 1100 09/22/13 1117 09/23/13 0615  NA 142 140 139  K 4.5 4.2 4.2  CL 104 105 101  CO2 23  --  26  GLUCOSE 111* 109* 96  BUN CREATININE 1.17 1.30 1.17  CALCIUM 9.4  --  9.5    Liver Function Tests:  Recent Labs Lab 09/22/13 1100 09/23/13 0615  AST 21 18  ALT 30 26  ALKPHOS 65 58  BILITOT 0.3 0.7  PROT 7.3 6.7  ALBUMIN 4.4 3.9   No results found for this basename: LIPASE, AMYLASE,  in the last 168 hours No results found for this basename: AMMONIA,  in the last 168 hours  CBC:  Recent Labs Lab 09/22/13 1100 09/22/13 1117 09/23/13 0615  WBC 6.1  --  6.4  NEUTROABS 3.9  --   --   HGB 15.4 16.7 14.4  HCT 46.3 49.0 42.8  MCV 90.1  --  88.2  PLT 231  --  238    Cardiac  Enzymes:  Recent Labs Lab 09/22/13 1828 09/23/13 0028 09/23/13 0615  TROPONINI <0.30 <0.30 <0.30    Lipid Panel:  Recent Labs Lab 09/23/13 0615  CHOL 128  TRIG 122  HDL 21*  CHOLHDL 6.1  VLDL 24  LDLCALC 83    CBG:  Recent Labs Lab 09/22/13 1127  GLUCAP 113*    Microbiology: Results for orders placed during the hospital encounter of 07/21/13  FUNGUS CULTURE W SMEAR     Status: None   Collection Time    07/21/13  4:41 PM      Result Value Ref Range Status   Specimen Description WOUND WRIST LEFT   Final   Special Requests NONE   Final   Fungal Smear     Final   Value: NO YEAST OR FUNGAL ELEMENTS SEEN     Performed at [facility]   Culture     Final   Value:  No Fungi Isolated in 4 Weeks     Performed at [facility]   Report Status 08/15/2013 FINAL   Final  AFB CULTURE WITH SMEAR     Status: None   Collection Time    07/21/13  4:41 PM      Result Value Ref Range Status   Specimen Description WOUND WRIST LEFT   Final   Special Requests NONE   Final   Acid Fast Smear     Final   Value: NO ACID FAST BACILLI SEEN     Performed at [facility]   Culture     Final   Value: NO ACID FAST BACILLI ISOLATED IN 6 WEEKS     Performed at [facility]   Report Status 09/02/2013 FINAL   Final  ANAEROBIC CULTURE     Status: None   Collection Time    07/21/13  4:41 PM      Result Value Ref Range Status   Specimen Description WOUND WRIST LEFT   Final   Special Requests NONE   Final   Gram Stain     Final   Value: RARE WBC PRESENT,BOTH PMN AND MONONUCLEAR     NO SQUAMOUS EPITHELIAL CELLS SEEN     NO ORGANISMS SEEN     Performed at [facility]   Culture     Final   Value: NO ANAEROBES ISOLATED     Performed at [facility]   Report Status 07/26/2013 FINAL   Final  WOUND CULTURE     Status: None   Collection Time    07/21/13  4:41 PM      Result Value Ref Range Status   Specimen Description WOUND WRIST LEFT   Final    Special Requests NONE   Final   Gram Stain     Final   Value: RARE WBC PRESENT,BOTH PMN AND MONONUCLEAR     NO SQUAMOUS EPITHELIAL CELLS SEEN     NO ORGANISMS SEEN     Performed at [facility]   Culture     Final   Value: NO GROWTH 2 DAYS     Performed at [facility]   Report Status 07/24/2013 FINAL   Final    Coagulation Studies:  Recent Labs  09/22/13 1100  LABPROT 14.1  INR 1.09    Imaging: Dg Cervical Spine Complete  09/22/2013   CLINICAL DATA:  Neck pain. Acute mental status changes. Unspecified cervical spine surgery in 2011.  EXAM: CERVICAL SPINE  4+ VIEWS  COMPARISON:  Cervical spine MRI 09/24/2009.  FINDINGS: Anatomic alignment. Straightening of the usual cervical lordosis. No visible fractures. Moderate disc space narrowing and endplate hypertrophic changes at C5-6 and C6-7. Mild disc space narrowing and endplate hypertrophic changes at C4-5. Normal prevertebral soft tissues. Facet joints intact. Oblique views demonstrate bilateral bony foraminal stenoses at C4-5, C5-6 and C6-7. No static evidence of instability. Note made of very small bilateral cervical ribs.  IMPRESSION: 1. No acute or subacute osseous abnormality. 2. Degenerative disc disease and spondylosis at C5-6 (moderate), C6-7 (moderate) and C4-5 (mild). 3. Bilateral bony foraminal stenoses at C4-5, C5-6 and C6-7.   Electronically Signed   By: Hulan Saas M.D.   On: 09/22/2013 14:38   Ct Head Wo Contrast  09/22/2013   CLINICAL DATA:  LEFT facial weakness and numbness which began earlier today, altered mental status  EXAM: CT HEAD WITHOUT CONTRAST  TECHNIQUE: Contiguous axial images were obtained from the  base of the skull through the vertex without intravenous contrast.  COMPARISON:  None  FINDINGS: Normal ventricular morphology.  No midline shift or mass effect.  Normal appearance of brain parenchyma.  No intracranial hemorrhage, mass lesion, or acute infarction.  Visualized paranasal sinuses  and mastoid air cells clear.  Bones unremarkable.  IMPRESSION: No acute intracranial abnormalities.   Electronically Signed   By: Ulyses Southward M.D.   On: 09/22/2013 10:49   Mr Maxine Glenn Head Wo Contrast  09/22/2013   CLINICAL DATA:  Sudden onset of LEFT arm numbness followed by LEFT facial droop and slurred speech. Symptoms gradually resolved. Currently asymptomatic.  EXAM: MRI HEAD WITHOUT CONTRAST  MRA HEAD WITHOUT CONTRAST  TECHNIQUE: Multiplanar, multiecho pulse sequences of the brain and surrounding structures were obtained without intravenous contrast. Angiographic images of the head were obtained using MRA technique without contrast.  COMPARISON:  CT head 09/22/2013.  FINDINGS: MRI HEAD FINDINGS  No evidence for acute infarction, hemorrhage, mass lesion, hydrocephalus, or extra-axial fluid. Normal for age cerebral volume. Minor subcortical and periventricular T2 and FLAIR hyperintensities, likely chronic microvascular ischemic change. Flow voids are maintained throughout the carotid, basilar, and vertebral arteries. There are no areas of chronic hemorrhage. Pituitary, pineal, and cerebellar tonsils unremarkable. No upper cervical lesions. Visualized calvarium, skull base, and upper cervical osseous structures unremarkable. Scalp and extracranial soft tissues, orbits, sinuses, and mastoids show no acute process.  MRA HEAD FINDINGS  The internal carotid arteries are widely patent. The basilar artery is widely patent with vertebrals codominant. There is no intracranial stenosis or aneurysm. Fetal origin LEFT PCA.  IMPRESSION: No evidence for acute stroke. Minor white matter signal abnormality, likely chronic microvascular ischemic change. No proximal flow reducing lesion on MRA.   Electronically Signed   By: Davonna Belling M.D.   On: 09/22/2013 18:33   Mr Brain Wo Contrast  09/22/2013   CLINICAL DATA:  Sudden onset of LEFT arm numbness followed by LEFT facial droop and slurred speech. Symptoms gradually resolved.  Currently asymptomatic.  EXAM: MRI HEAD WITHOUT CONTRAST  MRA HEAD WITHOUT CONTRAST  TECHNIQUE: Multiplanar, multiecho pulse sequences of the brain and surrounding structures were obtained without intravenous contrast. Angiographic images of the head were obtained using MRA technique without contrast.  COMPARISON:  CT head 09/22/2013.  FINDINGS: MRI HEAD FINDINGS  No evidence for acute infarction, hemorrhage, mass lesion, hydrocephalus, or extra-axial fluid. Normal for age cerebral volume. Minor subcortical and periventricular T2 and FLAIR hyperintensities, likely chronic microvascular ischemic change. Flow voids are maintained throughout the carotid, basilar, and vertebral arteries. There are no areas of chronic hemorrhage. Pituitary, pineal, and cerebellar tonsils unremarkable. No upper cervical lesions. Visualized calvarium, skull base, and upper cervical osseous structures unremarkable. Scalp and extracranial soft tissues, orbits, sinuses, and mastoids show no acute process.  MRA HEAD FINDINGS  The internal carotid arteries are widely patent. The basilar artery is widely patent with vertebrals codominant. There is no intracranial stenosis or aneurysm. Fetal origin LEFT PCA.  IMPRESSION: No evidence for acute stroke. Minor white matter signal abnormality, likely chronic microvascular ischemic change. No proximal flow reducing lesion on MRA.   Electronically Signed   By: Davonna Belling M.D.   On: 09/22/2013 18:33    Vascular Ultrasound  Carotid Duplex (Doppler) has been completed. Preliminary findings: Bilateral 60-79% ICA stenosis. Antegrade vertebral flow.   LDL 83 A1c Pending  Echo: Study Conclusions  - Left ventricle: The cavity size was normal. Systolic function was normal. The estimated ejection fraction  was in the range of 50% to 55%. Wall motion was normal; there were no regional wall motion abnormalities. There was an increased relative contribution of atrial contraction to ventricular  filling. Doppler parameters are consistent with abnormal left ventricular relaxation (grade 1 diastolic dysfunction). - Aortic valve: There was trivial regurgitation. - Aorta: Aortic root dimension: 35 mm (ED). - Ascending aorta: The ascending aorta was mildly dilated. - Mitral valve: There was mild regurgitation.   Felicie Morn PA-C [facility] [phone]  09/23/2013, 1:22 PM   Assessment: 53 y.o. male with transient episode of left face and arm weakness/tingling associated with slurred speech.  MRI negative for CVA but bilateral carotid doppler shows ICA stenosis 60-79% .  Symptoms concerning for TIA with bilateral carotid stenosis.   Stroke Risk Factors - hyperlipidemia  Recommend: 1) CTA neck 2) continue ASA 3) Stroke MD to follow  I personally participated in this patient's evaluation and management, including formulating above clinical impression and management recommendations.  Venetia Maxon M.D. [facility] [phone]

## 2013-09-23 NOTE — Consult Note (Signed)
[facility] [place]  Patient name: Andrew Sampson MRN: [id number] DOB: 1960/09/19 Sex: male  REASON FOR CONSULT: Symptomatic right carotid stenosis  HPI: Andrew Sampson is a 53 y.o. male who was admitted on 09/22/2013 after he developed the sudden onset of left arm paresthesias and left-sided facial drooping, in addition to slurred speech. On the morning of admission, he developed paresthesias in the left arm which lasted only briefly. He suddenly developed facial drooping on the left which lasted approximately 5 minutes. He then had a second episode of left facial drooping and also some difficulty speaking. He is right-handed. He denies any previous history of stroke, TIAs, expressive or receptive aphasia, or amaurosis fugax. His workup included a carotid duplex scan which showed bilateral 60-79% carotid stenoses. Vascular surgery was consult for further recommendations.  His risk factors for vascular disease include a strong family history of  Premature cardiovascular disease on his mother's side. In addition he has hypercholesterolemia. He denies any history of diabetes, hypertension, or smoking history.  He was on aspirin and was on a statin. He has had no further symptoms.  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 He has had no chest pain or chest pressure since that time.  Past Medical History  Diagnosis Date  . Heart burn   . Hyperlipidemia     statin intolerant  . Familial hyperlipidemia     LDL 212 on 01/12/13  . SEMI (subendocardial myocardial infarction)   . Anxiety   . Atypical chest pain   . GERD (gastroesophageal reflux disease)    Family History  Problem Relation Age of Onset  . Heart attack Father   . Heart disease Father   . Hypertension Father   . Hyperlipidemia Father   There is a history of premature cardiovascular disease on his mother side.  SOCIAL HISTORY: History  Substance Use  Topics  . Smoking status: Never Smoker   . Smokeless tobacco: Never Used  . Alcohol Use: Yes     Comment: occasional  He is married and has 3 children.  Allergies  Allergen Reactions  . Statins     Simvastatin (myalgias), Crestor (myalgias), Lipitor (with PCP in past had myalgias - tolerating this well currently)  . Zetia [Ezetimibe]     Muscle aches   Current Facility-Administered Medications  Medication Dose Route Frequency Provider Last Rate Last Dose  . acetaminophen (TYLENOL) tablet 650 mg  650 mg Oral Q6H PRN Jerald Kief, MD       Or  . acetaminophen (TYLENOL) suppository 650 mg  650 mg Rectal Q6H PRN Jerald Kief, MD      . aspirin EC tablet 325 mg  325 mg Oral Daily Jerald Kief, MD   325 mg at 09/22/13 1347  . atorvastatin (LIPITOR) tablet 40 mg  40 mg Oral q1800 Jerald Kief, MD   40 mg at 09/22/13 1829  . clonazePAM (KLONOPIN) tablet 0.5 mg  0.5 mg Oral TID PRN Jerald Kief, MD      . ibuprofen (ADVIL,MOTRIN) tablet 400 mg  400 mg Oral Q6H PRN Jerald Kief, MD      . metoprolol tartrate (LOPRESSOR) tablet 12.5 mg  12.5 mg Oral BID Jerald Kief, MD   12.5 mg at 09/23/13 1135  . morphine 2 MG/ML injection 2 mg  2 mg Intravenous Q4H PRN Jerald Kief, MD      .  ondansetron (ZOFRAN) tablet 4 mg  4 mg Oral Q6H PRN Jerald Kief, MD       Or  . ondansetron [facility]) injection 4 mg  4 mg Intravenous Q6H PRN Jerald Kief, MD      . pantoprazole (PROTONIX) EC tablet 40 mg  40 mg Oral Daily Jerald Kief, MD   40 mg at 09/23/13 1136  . sodium chloride 0.9 % injection 3 mL  3 mL Intravenous Q12H Jerald Kief, MD   3 mL at 09/22/13 2346   REVIEW OF SYSTEMS: Arly.Keller ] denotes positive finding; [  ] denotes negative finding CARDIOVASCULAR:   chest pain    chest pressure    palpitations    orthopnea    dyspnea on exertion    claudication    rest pain    DVT    phlebitis PULMONARY:    productive cough    asthma    wheezing NEUROLOGIC:     weakness  Arly.Keller ] paresthesias  Arly.Keller ] aphasia   amaurosis   dizziness HEMATOLOGIC:    bleeding problems    clotting disorders MUSCULOSKELETAL:   joint pain    joint swelling  leg swelling GASTROINTESTINAL:   blood in stool    hematemesis GENITOURINARY:    dysuria    hematuria PSYCHIATRIC:   history of major depression INTEGUMENTARY:   rashes   ulcers CONSTITUTIONAL:   fever    chills  PHYSICAL EXAM: Filed Vitals:   09/23/13 0200 09/23/13 0400 09/23/13 0600 09/23/13 1006  BP: 121/74 124/73 121/75 143/86  Pulse: 59 72 68 64  Temp:    99 F (37.2 C)  TempSrc:    Oral  Resp: Height:      Weight:      SpO2: 98% 97% 96% 97%   Body mass index is 23.3 kg/(m^2). GENERAL: The patient is a well-nourished male, in no acute distress. The vital signs are documented above. CARDIOVASCULAR: There is a regular rate and rhythm. I do not detect carotid bruits. He has palpable radial, femoral, popliteal, and pedal pulses bilaterally. He has no significant lower extremity swelling. PULMONARY: There is good air exchange bilaterally without wheezing or rales. ABDOMEN: Soft and non-tender with normal pitched bowel sounds.  MUSCULOSKELETAL: There are no major deformities or cyanosis. NEUROLOGIC: No focal weakness or paresthesias are detected. SKIN: There are no ulcers or rashes noted. PSYCHIATRIC: The patient has a normal affect.  DATA:  Lab Results  Component Value Date   WBC 6.4 09/23/2013   HGB 14.4 09/23/2013   HCT 42.8 09/23/2013   MCV 88.2 09/23/2013   PLT 238 09/23/2013   Lab Results  Component Value Date   NA 139 09/23/2013   K 4.2 09/23/2013   CL 101 09/23/2013   CO2 26 09/23/2013   Lab Results  Component Value Date   CREATININE 1.17 09/23/2013   Lab Results  Component Value Date   INR 1.09 09/22/2013   INR 1.37 01/15/2013   INR 1.11 01/14/2013   Lab Results  Component Value Date   HGBA1C 6.2* 09/23/2013   CBG (last 3)    Recent Labs  09/22/13 1127  GLUCAP 113*   CAROTID DUPLEX: I have reviewed his carotid duplex scan. The systolic velocity on the right is 187 cm/s with an  end-diastolic velocity of 65 cm/s. Thus the stenosis on the right appears to be approximately 60%. Likewise he has an approximately 60% stenosis on the left by duplex.  CT OF THE HEAD ON 09/22/2013: No acute intracranial abnormalities.   CT ANGIOGRAM NECK ON 09/23/2013: 60% stenosis at the origin of the right internal carotid artery with soft plaque. A small amount of plaque in the left carotid artery with no significant stenosis noted. 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 There is soft plaque in the proximal left subclavian artery but no significant stenosis of the left vertebral artery noted.  2-D ECHO ON 09/22/2013: The patient has normal systolic function with an ejection fraction estimated at 50-55%.  MEDICAL ISSUES: SYMPTOMATIC 60% RIGHT CAROTID STENOSIS: The patient's episodes of facial drooping on the left and left upper extremity paresthesias I think are most likely related to the soft plaque noted in his right carotid artery. Although the stenosis is only 60%, the plaque is noted to be soft by CT. These plaques are more likely to embolize and I suspect that he had an embolic event from his right carotid stenosis. His workup otherwise shows no etiology for his symptoms. He does have a history of some cervical disc problems but certainly this would not explain facial drooping on the left. This reason I have recommended right carotid endarterectomy which has been scheduled for 09/30/2013. He can be discharged from our standpoint and I will bring him back next Tuesday for right carotid endarterectomy. I have reviewed the indications for carotid endarterectomy, that is to lower the risk of future stroke. I have also reviewed the potential complications of surgery, including but not limited to:  bleeding, stroke (perioperative risk 1-2%), MI, nerve injury of other unpredictable medical problems. All of the patients questions were answered and they are agreeable to proceed with surgery.  He does know to continue taking his aspirin and statin.  I spent 60 minutes with the patient and have answered all of his questions.  Bonham Zingale S [facility] [place]: [phone]

## 2013-09-23 NOTE — Discharge Instructions (Signed)
 Carotid Artery Disease The carotid arteries are the two main arteries on either side of the neck that supply blood to the brain. Carotid artery disease, also called carotid artery stenosis, is the narrowing or blockage of one or both carotid arteries. Carotid artery disease increases your risk for a stroke or a transient ischemic attack (TIA). A TIA is an episode in which a waxy, fatty substance that accumulates within the artery (plaque) blocks blood flow to the brain. A TIA is considered a "warning stroke."  CAUSES   Buildup of plaque inside the carotid arteries (atherosclerosis) (common).  A weakened outpouching in an artery (aneurysm).  Inflammation of the carotid artery (arteritis).  A fibrous growth within the carotid artery (fibromuscular dysplasia).  Tissue death within the carotid artery due to radiation treatment (post-radiation necrosis).  Decreased blood flow due to spasms of the carotid artery (vasospasm).  Separation of the walls of the carotid artery (carotid dissection). RISK FACTORS  High cholesterol (dyslipidemia).   High blood pressure (hypertension).   Smoking.   Obesity.   Diabetes.   Family history of cardiovascular disease.   Inactivity or lack of regular exercise.   Being male. Men have an increased risk of developing atherosclerosis earlier in life than women.  SYMPTOMS  Carotid artery disease does not cause symptoms. DIAGNOSIS Diagnosis of carotid artery disease may include:   A physical exam. Your health care provider may hear an abnormal sound (bruit) when listening to the carotid arteries.   Specific tests that look at the blood flow in the carotid arteries. These tests include:   Carotid artery ultrasonography.   Carotid or cerebral angiography.   Computerized tomographic angiography (CTA).   Magnetic resonance angiography (MRA).  TREATMENT  Treatment of carotid artery disease can include a combination of treatments.  Treatment options include:  Surgery. You may have:   A carotid endarterectomy. This is a surgery to remove the blockages in the carotid arteries.   A carotid angioplasty with stenting. This is a nonsurgical interventional procedure. A wire mesh (stent) is used to widen the blocked carotid arteries.   Medicines to control blood pressure, cholesterol, and reduce blood clotting (antiplatelet therapy).   Adjusting your diet.   Lifestyle changes such as:   Quitting smoking.   Exercising as tolerated or as directed by your health care provider.   Controlling and maintaining a good blood pressure.   Keeping cholesterol levels under control.  HOME CARE INSTRUCTIONS   Take medicines only as directed by your health care provider. Make sure you understand all your medicine instructions. Do not stop your medicines without talking to your health care provider.   Follow your health care provider's diet instructions. It is important to eat a healthy diet that is low in saturated fats and includes plenty of fresh fruits, vegetables, and lean meats. High-fat, high-sodium foods as well as foods that are fried, overly processed, or have poor nutritional value should be avoided.  Maintain a healthy weight.   Stay physically active. It is recommended that you get at least 30 minutes of activity every day.   Do not use any tobacco products including cigarettes, chewing tobacco, or electronic cigarettes. If you need help quitting, ask your health care provider.  Limit alcohol use to:   No more than 2 drinks per day for men.   No more than 1 drink per day for nonpregnant women.   Do not use illegal drugs.   Keep all follow-up visits as directed by your   health care provider.  SEEK IMMEDIATE MEDICAL CARE IF:  You develop TIA or stroke symptoms. These include:   Sudden weakness or numbness on one side of the body, such as in the face, arm, or leg.   Sudden confusion.    Trouble speaking (aphasia) or understanding.   Sudden trouble seeing out of one or both eyes.   Sudden trouble walking.   Dizziness or feeling like you might faint.   Loss of balance or coordination.   Sudden severe headache with no known cause.   Sudden trouble swallowing (dysphagia).  If you have any of these symptoms, call your local emergency services (911 in [place] Do not drive yourself to the clinic or hospital. This is a medical emergency.  Document Released: 04/10/2011 Document Revised: 06/02/2013 Document Reviewed: 07/17/2012 [employer] Patient Information 2015 [employer]. This information is not intended to replace advice given to you by your health care provider. Make sure you discuss any questions you have with your health care provider.  

## 2013-09-23 NOTE — Discharge Summary (Signed)
Physician Discharge Summary  THEOPOLIS SLOOP ZOX:[id number] DOB: 1960/02/23 DOA: 09/22/2013  PCP: [name], MD  Admit date: 09/22/2013 Discharge date: 09/23/2013  Recommendations for Outpatient Follow-up:  1. Pt has appt with vascular surgery on Tuesday 09/30/2013 for surgery   Discharge Diagnoses:  Active Problems:   Atypical chest pain   Hyperlipidemia   S/P CABG x 4   Cerebral vascular disease   TIA (transient ischemic attack)    Discharge Condition: stable   Diet recommendation: as tolerated   History of present illness:  53 year old male with history of MI, CABG x 4 recently who presented to [facility] ED with symptoms of facial numbness, tingling which started while mountain biking. 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 This time he thought it was something transient but it persisted while bike riding. By the time he presented to ED his symptoms were all resolved.  MRI brain did not reveal acute infarct but carotid doppler showed significant stenosis bilaterally. CT angio neck showed soft plaque affecting the right carotid artery, with 60% stenosis at the right ICA origin; high-grade stenosis at the right vertebral artery origin due to soft plaque. Also seen was mild left carotid plaque, primarily at the distal left ICA bulb. No hemodynamically significant stenosis;s oft plaque in the proximal left subclavian artery, but without significant stenosis and normal left vertebral artery origin.  Hospital Course:   Active Problems: Facial numbness and tingling sensation  Likely carotid artery stenosis  Pt seen by neurology as well as vascular surgery. Plan is for surgery 09/30/2013.  Pt will continue current meds  spoek with patient about foods that may raise HDL which seems to be the only one on lipid panel at below goal.    Signed:  Manson Passey, MD  [facility] 09/23/2013, 6:22 PM  Pager #: [phone]   Consultations:  Neurology  Vascular  surgery   Discharge Exam: Filed Vitals:   09/23/13 1006  BP: 143/86  Pulse: 64  Temp: 99 F (37.2 C)  Resp: 16   Filed Vitals:   09/23/13 0200 09/23/13 0400 09/23/13 0600 09/23/13 1006  BP: 121/74 124/73 121/75 143/86  Pulse: 59 72 68 64  Temp:    99 F (37.2 C)  TempSrc:    Oral  Resp: Height:      Weight:      SpO2: 98% 97% 96% 97%    General: Pt is alert, follows commands appropriately, not in acute distress Cardiovascular: Regular rate and rhythm, S1/S2 +, no murmurs Respiratory: Clear to auscultation bilaterally, no wheezing, no crackles, no rhonchi Abdominal: Soft, non tender, non distended, bowel sounds +, no guarding Extremities: no edema, no cyanosis, pulses palpable bilaterally DP and PT Neuro: Grossly nonfocal  Discharge Instructions  Discharge Instructions   Call MD for:  difficulty breathing, headache or visual disturbances    Complete by:  As directed      Call MD for:  persistant dizziness or light-headedness    Complete by:  As directed      Call MD for:  persistant nausea and vomiting    Complete by:  As directed      Call MD for:  severe uncontrolled pain    Complete by:  As directed      Diet - low sodium heart healthy    Complete by:  As directed      Discharge instructions    Complete by:  As directed  Follow up with vascular surgery per scheduled appt.     Increase activity slowly    Complete by:  As directed             Medication List         acyclovir 200 MG capsule  Commonly known as:  ZOVIRAX  Take 400 mg by mouth at bedtime.     aspirin 325 MG EC tablet  Take 1 tablet (325 mg total) by mouth daily.     atorvastatin 40 MG tablet  Commonly known as:  LIPITOR  Take 1 tablet (40 mg total) by mouth daily at 6 PM.     clonazePAM 0.5 MG tablet  Commonly known as:  KLONOPIN  Take 0.5 mg by mouth at bedtime as needed for anxiety.     Co Q-10 100 MG Caps  Take 400 mg by mouth daily.     esomeprazole 40 MG  capsule  Commonly known as:  NEXIUM  Take 40 mg by mouth daily at 12 noon.     ibuprofen 200 MG tablet  Commonly known as:  ADVIL,MOTRIN  Take 400 mg by mouth every 6 (six) hours as needed for moderate pain.     loratadine 10 MG tablet  Commonly known as:  CLARITIN  Take 10 mg by mouth daily as needed for allergies.     metoprolol tartrate 25 MG tablet  Commonly known as:  LOPRESSOR  Take 12.5 mg by mouth 2 (two) times daily.     RESTASIS 0.05 % ophthalmic emulsion  Generic drug:  cycloSPORINE  Place 1 drop into both eyes at bedtime. daily           Follow-up Information   Schedule an appointment as soon as possible for a visit with DICKSON,CHRISTOPHER S, MD. (Follow up appt after recent hospitalization)    Specialty:  Vascular Surgery   Contact information:   [address] [place] [address] [phone]        The results of significant diagnostics from this hospitalization (including imaging, microbiology, ancillary and laboratory) are listed below for reference.    Significant Diagnostic Studies: Dg Cervical Spine Complete  09/22/2013   CLINICAL DATA:  Neck pain. Acute mental status changes. Unspecified cervical spine surgery in 2011.  EXAM: CERVICAL SPINE  4+ VIEWS  COMPARISON:  Cervical spine MRI 09/24/2009.  FINDINGS: Anatomic alignment. Straightening of the usual cervical lordosis. No visible fractures. Moderate disc space narrowing and endplate hypertrophic changes at C5-6 and C6-7. Mild disc space narrowing and endplate hypertrophic changes at C4-5. Normal prevertebral soft tissues. Facet joints intact. Oblique views demonstrate bilateral bony foraminal stenoses at C4-5, C5-6 and C6-7. No static evidence of instability. Note made of very small bilateral cervical ribs.  IMPRESSION: 1. No acute or subacute osseous abnormality. 2. Degenerative disc disease and spondylosis at C5-6 (moderate), C6-7 (moderate) and C4-5 (mild). 3. Bilateral bony foraminal stenoses at C4-5,  C5-6 and C6-7.   Electronically Signed   By: Hulan Saas M.D.   On: 09/22/2013 14:38   Ct Head Wo Contrast  09/22/2013   CLINICAL DATA:  LEFT facial weakness and numbness which began earlier today, altered mental status  EXAM: CT HEAD WITHOUT CONTRAST  TECHNIQUE: Contiguous axial images were obtained from the base of the skull through the vertex without intravenous contrast.  COMPARISON:  None  FINDINGS: Normal ventricular morphology.  No midline shift or mass effect.  Normal appearance of brain parenchyma.  No intracranial hemorrhage, mass lesion, or acute infarction.  Visualized paranasal sinuses and mastoid air cells clear.  Bones unremarkable.  IMPRESSION: No acute intracranial abnormalities.   Electronically Signed   By: Ulyses Southward M.D.   On: 09/22/2013 10:49   Ct Angio Neck W/cm &/or Wo/cm  09/23/2013   CLINICAL DATA:  53 year old male with sudden onset left upper extremity numbness, facial droop and slurred speech. History of ICA stenosis by carotid Doppler. Initial encounter. Evaluate carotid artery occlusion.  EXAM: CT ANGIOGRAPHY NECK  TECHNIQUE: Multidetector CT imaging of the neck was performed using the standard protocol during bolus administration of intravenous contrast. Multiplanar CT image reconstructions and MIPs were obtained to evaluate the vascular anatomy. Carotid stenosis measurements (when applicable) are obtained utilizing NASCET criteria, using the distal internal carotid diameter as the denominator.  CONTRAST:  50mL OMNIPAQUE IOHEXOL 350 MG/ML SOLN  COMPARISON:  Brain MRI and MRA 09/22/2013.  FINDINGS: Negative lung apices. No superior mediastinal lymphadenopathy. Sequelae of median sternotomy and CABG partially visible. Thyroid, larynx, pharynx, parapharyngeal spaces, retropharyngeal space, sublingual space, submandibular glands, parotid glands, and orbits soft tissues are within normal limits. Visualized paranasal sinuses and mastoids are clear. No acute osseous abnormality  identified. No cervical lymphadenopathy.  VASCULAR FINDINGS:  Three vessel arch configuration. No hemodynamically significant great vessel origin stenosis. Great vessel soft plaque is most pronounced at the left subclavian artery origin.  Normal right CCA origin. However, more abundant soft plaque occurs at the right carotid bifurcation and mostly affects the right ICA origin. Stenosis is up to 60 % with respect to the distal vessel. See series 404, image 85. Beyond the origin, the cervical right ICA is within normal limits. Negative visualized right ICA siphon.  No proximal right subclavian artery stenosis. However, there is abundant soft plaque at the right vertebral artery origin, resulting in high-grade stenosis best seen on series 401, image 44 and series 403, image 114. Despite this, the right vertebral artery is patent, and only mildly non dominant appearing in the neck. Is otherwise normal to the skullbase. Incidental early right PICA origin noted. Intracranial right vertebral artery, vertebrobasilar junction, and visible basilar artery are within normal limits.  Minor soft plaque at the left CCA origin. Mild soft plaque at the left carotid bifurcation. Proximal left ICA irregularity is most pronounced at the distal bulb, but stenosis is less than 50 % with respect to the distal vessel. Be on the bulb, the cervical left ICA is negative. Negative visible left ICA siphon.  No hemodynamically significant proximal left subclavian artery stenosis despite soft plaque. Normal left vertebral artery origin. Normal left vertebral artery to the vertebrobasilar junction. Normal left PICA origin. Intermittent ventral soft plaque in the right CCA, not hemodynamically significant  Review of the MIP images confirms the above findings.  IMPRESSION: 1. Soft plaque affecting the right carotid artery, with 60% stenosis at the right ICA origin. 2. High-grade stenosis at the right vertebral artery origin due to soft plaque. See  coronal series 403, image 114. 3. Mild left carotid plaque, primarily at the distal left ICA bulb. No hemodynamically significant stenosis. 4. Soft plaque in the proximal left subclavian artery, but without significant stenosis and normal left vertebral artery origin.   Electronically Signed   By: Augusto Gamble M.D.   On: 09/23/2013 15:33   Mr Maxine Glenn Head Wo Contrast  09/22/2013   CLINICAL DATA:  Sudden onset of LEFT arm numbness followed by LEFT facial droop and slurred speech. Symptoms gradually resolved. Currently asymptomatic.  EXAM: MRI HEAD WITHOUT CONTRAST  MRA HEAD WITHOUT CONTRAST  TECHNIQUE: Multiplanar, multiecho pulse sequences of the brain and surrounding structures were obtained without intravenous contrast. Angiographic images of the head were obtained using MRA technique without contrast.  COMPARISON:  CT head 09/22/2013.  FINDINGS: MRI HEAD FINDINGS  No evidence for acute infarction, hemorrhage, mass lesion, hydrocephalus, or extra-axial fluid. Normal for age cerebral volume. Minor subcortical and periventricular T2 and FLAIR hyperintensities, likely chronic microvascular ischemic change. Flow voids are maintained throughout the carotid, basilar, and vertebral arteries. There are no areas of chronic hemorrhage. Pituitary, pineal, and cerebellar tonsils unremarkable. No upper cervical lesions. Visualized calvarium, skull base, and upper cervical osseous structures unremarkable. Scalp and extracranial soft tissues, orbits, sinuses, and mastoids show no acute process.  MRA HEAD FINDINGS  The internal carotid arteries are widely patent. The basilar artery is widely patent with vertebrals codominant. There is no intracranial stenosis or aneurysm. Fetal origin LEFT PCA.  IMPRESSION: No evidence for acute stroke. Minor white matter signal abnormality, likely chronic microvascular ischemic change. No proximal flow reducing lesion on MRA.   Electronically Signed   By: Davonna Belling M.D.   On: 09/22/2013 18:33    Mr Brain Wo Contrast  09/22/2013   CLINICAL DATA:  Sudden onset of LEFT arm numbness followed by LEFT facial droop and slurred speech. Symptoms gradually resolved. Currently asymptomatic.  EXAM: MRI HEAD WITHOUT CONTRAST  MRA HEAD WITHOUT CONTRAST  TECHNIQUE: Multiplanar, multiecho pulse sequences of the brain and surrounding structures were obtained without intravenous contrast. Angiographic images of the head were obtained using MRA technique without contrast.  COMPARISON:  CT head 09/22/2013.  FINDINGS: MRI HEAD FINDINGS  No evidence for acute infarction, hemorrhage, mass lesion, hydrocephalus, or extra-axial fluid. Normal for age cerebral volume. Minor subcortical and periventricular T2 and FLAIR hyperintensities, likely chronic microvascular ischemic change. Flow voids are maintained throughout the carotid, basilar, and vertebral arteries. There are no areas of chronic hemorrhage. Pituitary, pineal, and cerebellar tonsils unremarkable. No upper cervical lesions. Visualized calvarium, skull base, and upper cervical osseous structures unremarkable. Scalp and extracranial soft tissues, orbits, sinuses, and mastoids show no acute process.  MRA HEAD FINDINGS  The internal carotid arteries are widely patent. The basilar artery is widely patent with vertebrals codominant. There is no intracranial stenosis or aneurysm. Fetal origin LEFT PCA.  IMPRESSION: No evidence for acute stroke. Minor white matter signal abnormality, likely chronic microvascular ischemic change. No proximal flow reducing lesion on MRA.   Electronically Signed   By: Davonna Belling M.D.   On: 09/22/2013 18:33    Microbiology: No results found for this or any previous visit (from the past 240 hour(s)).   Labs: Basic Metabolic Panel:  Recent Labs Lab 09/22/13 1100 09/22/13 1117 09/23/13 0615  NA 142 140 139  K 4.5 4.2 4.2  CL 104 105 101  CO2 23  --  26  GLUCOSE 111* 109* 96  BUN CREATININE 1.17 1.30 1.17  CALCIUM 9.4   --  9.5   Liver Function Tests:  Recent Labs Lab 09/22/13 1100 09/23/13 0615  AST 21 18  ALT 30 26  ALKPHOS 65 58  BILITOT 0.3 0.7  PROT 7.3 6.7  ALBUMIN 4.4 3.9   No results found for this basename: LIPASE, AMYLASE,  in the last 168 hours No results found for this basename: AMMONIA,  in the last 168 hours CBC:  Recent Labs Lab 09/22/13 1100 09/22/13 1117 09/23/13 0615  WBC 6.1  --  6.4  NEUTROABS 3.9  --   --   HGB 15.4 16.7 14.4  HCT 46.3 49.0 42.8  MCV 90.1  --  88.2  PLT 231  --  238   Cardiac Enzymes:  Recent Labs Lab 09/22/13 1828 09/23/13 0028 09/23/13 0615  TROPONINI <0.30 <0.30 <0.30   BNP: BNP (last 3 results)  Recent Labs  01/10/13 1642  PROBNP 25.7   CBG:  Recent Labs Lab 09/22/13 1127  GLUCAP 113*    Time coordinating discharge: Over 30 minutes

## 2013-09-23 NOTE — Progress Notes (Signed)
Patient alert and oriented, voiding adequate amount of urine, mae's well. Patient with no c/o pain or/and discomfort. Patient discharged home with spouse. Patient and family stated understanding of discharged instructions given. Patient to call vascular surgeon for appointment as soon as possible. Aisha RN

## 2013-09-24 ENCOUNTER — Other Ambulatory Visit: Payer: Self-pay

## 2013-09-29 ENCOUNTER — Encounter (HOSPITAL_COMMUNITY): Payer: Self-pay | Admitting: *Deleted

## 2013-09-29 MED ORDER — DEXTROSE 5 % IV SOLN
1.5000 g | INTRAVENOUS | Status: AC
  Administered 2013-09-30: 1.5 g via INTRAVENOUS
  Filled 2013-09-29: qty 1.5

## 2013-09-29 NOTE — Anesthesia Preprocedure Evaluation (Addendum)
Anesthesia Evaluation  Patient identified by MRN, date of birth, ID band Patient awake    Reviewed: Allergy & Precautions, H&P , NPO status , Patient's Chart, lab work & pertinent test results  Airway       Dental   Pulmonary          Cardiovascular Exercise Tolerance: Good + Past MI and + CABG (07/2012  ECHO 08/2013 EF 50-55%)     Neuro/Psych TIA (about 1 week ago  resolved  R ICA 60%, left 50%.  L R vertebral patent but narrowed)   GI/Hepatic GERD-  Medicated,  Endo/Other  Diabetes: A1C 6.1.  Renal/GU      Musculoskeletal   Abdominal   Peds  Hematology   Anesthesia Other Findings   Reproductive/Obstetrics                          Anesthesia Physical Anesthesia Plan  ASA: III  Anesthesia Plan: General   Post-op Pain Management:    Induction: Intravenous  Airway Management Planned: Oral ETT  Additional Equipment: Arterial line  Intra-op Plan:   Post-operative Plan: Extubation in OR  Informed Consent: I have reviewed the patients History and Physical, chart, labs and discussed the procedure including the risks, benefits and alternatives for the proposed anesthesia with the patient or authorized representative who has indicated his/her understanding and acceptance.     Plan Discussed with:   Anesthesia Plan Comments:         Anesthesia Quick Evaluation

## 2013-09-30 ENCOUNTER — Encounter (HOSPITAL_COMMUNITY)
Admission: RE | Disposition: A | Discharge status: Home / Self Care | Payer: Self-pay | Source: Ambulatory Visit | Attending: Vascular Surgery

## 2013-09-30 ENCOUNTER — Inpatient Hospital Stay (HOSPITAL_COMMUNITY): Payer: BC Managed Care – PPO | Admitting: Anesthesiology

## 2013-09-30 ENCOUNTER — Encounter (HOSPITAL_COMMUNITY): Payer: Self-pay | Admitting: *Deleted

## 2013-09-30 ENCOUNTER — Encounter (HOSPITAL_COMMUNITY): Payer: BC Managed Care – PPO | Admitting: Anesthesiology

## 2013-09-30 ENCOUNTER — Inpatient Hospital Stay (HOSPITAL_COMMUNITY)
Admission: RE | Admit: 2013-09-30 | Discharge: 2013-10-01 | DRG: 039 | Disposition: A | Discharge status: Home / Self Care | Payer: BC Managed Care – PPO | Source: Ambulatory Visit | Attending: Vascular Surgery | Admitting: Vascular Surgery

## 2013-09-30 ENCOUNTER — Telehealth: Payer: Self-pay | Admitting: Vascular Surgery

## 2013-09-30 DIAGNOSIS — K219 Gastro-esophageal reflux disease without esophagitis: Secondary | ICD-10-CM | POA: Diagnosis present

## 2013-09-30 DIAGNOSIS — I252 Old myocardial infarction: Secondary | ICD-10-CM

## 2013-09-30 DIAGNOSIS — Z79899 Other long term (current) drug therapy: Secondary | ICD-10-CM

## 2013-09-30 DIAGNOSIS — Z8249 Family history of ischemic heart disease and other diseases of the circulatory system: Secondary | ICD-10-CM

## 2013-09-30 DIAGNOSIS — I6529 Occlusion and stenosis of unspecified carotid artery: Secondary | ICD-10-CM

## 2013-09-30 DIAGNOSIS — E7800 Pure hypercholesterolemia, unspecified: Secondary | ICD-10-CM | POA: Diagnosis present

## 2013-09-30 DIAGNOSIS — I6521 Occlusion and stenosis of right carotid artery: Secondary | ICD-10-CM

## 2013-09-30 DIAGNOSIS — Z7982 Long term (current) use of aspirin: Secondary | ICD-10-CM

## 2013-09-30 DIAGNOSIS — E785 Hyperlipidemia, unspecified: Secondary | ICD-10-CM | POA: Diagnosis present

## 2013-09-30 HISTORY — DX: Occlusion and stenosis of unspecified carotid artery: I65.29

## 2013-09-30 HISTORY — PX: ENDARTERECTOMY: SHX5162

## 2013-09-30 LAB — CBC
HEMATOCRIT: 37.7 % — AB (ref 39.0–52.0)
Hemoglobin: 12.4 g/dL — ABNORMAL LOW (ref 13.0–17.0)
MCH: 29.6 pg (ref 26.0–34.0)
MCHC: 32.9 g/dL (ref 30.0–36.0)
MCV: 90 fL (ref 78.0–100.0)
Platelets: 205 10*3/uL (ref 150–400)
RBC: 4.19 MIL/uL — ABNORMAL LOW (ref 4.22–5.81)
RDW: 12.6 % (ref 11.5–15.5)
WBC: 6.4 10*3/uL (ref 4.0–10.5)

## 2013-09-30 LAB — CREATININE, SERUM
Creatinine, Ser: 1.08 mg/dL (ref 0.50–1.35)
GFR calc Af Amer: 88 mL/min — ABNORMAL LOW (ref >90)
GFR calc non Af Amer: 76 mL/min — ABNORMAL LOW (ref >90)

## 2013-09-30 LAB — SURGICAL PCR SCREEN
MRSA, PCR: NEGATIVE
STAPHYLOCOCCUS AUREUS: POSITIVE — AB

## 2013-09-30 LAB — TYPE AND SCREEN
ABO/RH(D): A POS
Antibody Screen: NEGATIVE

## 2013-09-30 LAB — POCT I-STAT 4, (NA,K, GLUC, HGB,HCT)
Glucose, Bld: 112 mg/dL — ABNORMAL HIGH (ref 70–99)
HCT: 47 % (ref 39.0–52.0)
HEMOGLOBIN: 16 g/dL (ref 13.0–17.0)
Potassium: 4 mEq/L (ref 3.7–5.3)
Sodium: 142 mEq/L (ref 137–147)

## 2013-09-30 SURGERY — ENDARTERECTOMY, CAROTID
Anesthesia: General | Site: Neck | Laterality: Right

## 2013-09-30 MED ORDER — SODIUM CHLORIDE 0.9 % IV BOLUS (SEPSIS): 500.0000 mL | Freq: Once | INTRAVENOUS | Status: DC

## 2013-09-30 MED ORDER — OXYCODONE HCL 5 MG PO TABS: 5.0000 mg | ORAL_TABLET | Freq: Four times a day (QID) | ORAL | Status: DC | PRN

## 2013-09-30 MED ORDER — ROCURONIUM BROMIDE 50 MG/5ML IV SOLN
INTRAVENOUS | Status: AC
  Filled 2013-09-30: qty 1

## 2013-09-30 MED ORDER — PHENOL 1.4 % MT LIQD: 1.0000 | OROMUCOSAL | Status: DC | PRN

## 2013-09-30 MED ORDER — 0.9 % SODIUM CHLORIDE (POUR BTL) OPTIME
TOPICAL | Status: DC | PRN
Start: 2013-09-30 — End: 2013-09-30
  Administered 2013-09-30 (×2): 1000 mL

## 2013-09-30 MED ORDER — ALUM & MAG HYDROXIDE-SIMETH 200-200-20 MG/5ML PO SUSP: 15.0000 mL | ORAL | Status: DC | PRN

## 2013-09-30 MED ORDER — CHLORHEXIDINE GLUCONATE CLOTH 2 % EX PADS: 6.0000 | MEDICATED_PAD | Freq: Once | CUTANEOUS | Status: DC

## 2013-09-30 MED ORDER — ACETAMINOPHEN 650 MG RE SUPP: 325.0000 mg | RECTAL | Status: DC | PRN

## 2013-09-30 MED ORDER — LIDOCAINE HCL (CARDIAC) 20 MG/ML IV SOLN
INTRAVENOUS | Status: DC | PRN
  Administered 2013-09-30: 80 mg via INTRAVENOUS

## 2013-09-30 MED ORDER — MUPIROCIN 2 % EX OINT
1.0000 "application " | TOPICAL_OINTMENT | Freq: Once | CUTANEOUS | Status: AC
  Administered 2013-09-30: 1 via TOPICAL

## 2013-09-30 MED ORDER — LIDOCAINE HCL (PF) 1 % IJ SOLN
INTRAMUSCULAR | Status: AC
  Filled 2013-09-30: qty 30

## 2013-09-30 MED ORDER — SODIUM CHLORIDE 0.9 % IV SOLN: INTRAVENOUS | Status: DC

## 2013-09-30 MED ORDER — POTASSIUM CHLORIDE CRYS ER 20 MEQ PO TBCR: 20.0000 meq | EXTENDED_RELEASE_TABLET | Freq: Every day | ORAL | Status: DC | PRN

## 2013-09-30 MED ORDER — ATORVASTATIN CALCIUM 40 MG PO TABS
40.0000 mg | ORAL_TABLET | Freq: Every day | ORAL | Status: DC
  Administered 2013-09-30: 40 mg via ORAL
  Filled 2013-09-30 (×2): qty 1

## 2013-09-30 MED ORDER — THROMBIN 20000 UNITS EX SOLR
CUTANEOUS | Status: AC
  Filled 2013-09-30: qty 20000

## 2013-09-30 MED ORDER — LIDOCAINE-EPINEPHRINE (PF) 1 %-1:200000 IJ SOLN
INTRAMUSCULAR | Status: DC | PRN
  Administered 2013-09-30: 30 mL

## 2013-09-30 MED ORDER — SUCCINYLCHOLINE CHLORIDE 20 MG/ML IJ SOLN
INTRAMUSCULAR | Status: AC
  Filled 2013-09-30: qty 1

## 2013-09-30 MED ORDER — MEPERIDINE HCL 25 MG/ML IJ SOLN: 6.2500 mg | INTRAMUSCULAR | Status: DC | PRN

## 2013-09-30 MED ORDER — FENTANYL CITRATE 0.05 MG/ML IJ SOLN: 25.0000 ug | INTRAMUSCULAR | Status: DC | PRN

## 2013-09-30 MED ORDER — DOPAMINE-DEXTROSE 3.2-5 MG/ML-% IV SOLN: 3.0000 ug/kg/min | INTRAVENOUS | Status: DC | PRN

## 2013-09-30 MED ORDER — CLONAZEPAM 0.5 MG PO TABS
0.5000 mg | ORAL_TABLET | Freq: Every evening | ORAL | Status: DC | PRN
  Administered 2013-09-30: 0.5 mg via ORAL
  Filled 2013-09-30: qty 1

## 2013-09-30 MED ORDER — PANTOPRAZOLE SODIUM 40 MG PO TBEC
80.0000 mg | DELAYED_RELEASE_TABLET | Freq: Every day | ORAL | Status: DC
  Filled 2013-09-30: qty 2

## 2013-09-30 MED ORDER — MORPHINE SULFATE 2 MG/ML IJ SOLN: 2.0000 mg | INTRAMUSCULAR | Status: DC | PRN

## 2013-09-30 MED ORDER — PROMETHAZINE HCL 25 MG/ML IJ SOLN
6.2500 mg | INTRAMUSCULAR | Status: DC | PRN
Start: 2013-09-30 — End: 2013-09-30

## 2013-09-30 MED ORDER — LIDOCAINE-EPINEPHRINE (PF) 1 %-1:200000 IJ SOLN
INTRAMUSCULAR | Status: AC
  Filled 2013-09-30: qty 10

## 2013-09-30 MED ORDER — DOPAMINE-DEXTROSE 3.2-5 MG/ML-% IV SOLN
INTRAVENOUS | Status: AC
  Filled 2013-09-30: qty 250

## 2013-09-30 MED ORDER — DEXAMETHASONE SODIUM PHOSPHATE 4 MG/ML IJ SOLN
INTRAMUSCULAR | Status: DC | PRN
  Administered 2013-09-30: 4 mg via INTRAVENOUS

## 2013-09-30 MED ORDER — HEPARIN SODIUM (PORCINE) 1000 UNIT/ML IJ SOLN
INTRAMUSCULAR | Status: AC
Start: 2013-09-30 — End: 2013-09-30
  Filled 2013-09-30: qty 1

## 2013-09-30 MED ORDER — DEXTROSE 5 % IV SOLN
1.5000 g | Freq: Two times a day (BID) | INTRAVENOUS | Status: AC
  Administered 2013-09-30 – 2013-10-01 (×2): 1.5 g via INTRAVENOUS
  Filled 2013-09-30 (×2): qty 1.5

## 2013-09-30 MED ORDER — MIDAZOLAM HCL 5 MG/5ML IJ SOLN
INTRAMUSCULAR | Status: DC | PRN
  Administered 2013-09-30: 2 mg via INTRAVENOUS

## 2013-09-30 MED ORDER — PROPOFOL 10 MG/ML IV BOLUS
INTRAVENOUS | Status: DC | PRN
  Administered 2013-09-30: 50 mg via INTRAVENOUS
  Administered 2013-09-30: 140 mg via INTRAVENOUS

## 2013-09-30 MED ORDER — DOCUSATE SODIUM 100 MG PO CAPS: 100.0000 mg | ORAL_CAPSULE | Freq: Every day | ORAL | Status: DC

## 2013-09-30 MED ORDER — MAGNESIUM SULFATE 40 MG/ML IJ SOLN: 2.0000 g | Freq: Every day | INTRAMUSCULAR | Status: DC | PRN

## 2013-09-30 MED ORDER — LACTATED RINGERS IV SOLN
INTRAVENOUS | Status: DC
  Administered 2013-09-30: 08:00:00 via INTRAVENOUS

## 2013-09-30 MED ORDER — HEPARIN SODIUM (PORCINE) 1000 UNIT/ML IJ SOLN
INTRAMUSCULAR | Status: DC | PRN
  Administered 2013-09-30: 7000 [IU] via INTRAVENOUS

## 2013-09-30 MED ORDER — SODIUM CHLORIDE 0.9 % IJ SOLN
INTRAMUSCULAR | Status: AC
  Filled 2013-09-30: qty 10

## 2013-09-30 MED ORDER — PROTAMINE SULFATE 10 MG/ML IV SOLN
INTRAVENOUS | Status: DC | PRN
  Administered 2013-09-30: 10 mg via INTRAVENOUS
  Administered 2013-09-30: 20 mg via INTRAVENOUS

## 2013-09-30 MED ORDER — ONDANSETRON HCL 4 MG/2ML IJ SOLN
INTRAMUSCULAR | Status: AC
  Filled 2013-09-30: qty 2

## 2013-09-30 MED ORDER — DEXMEDETOMIDINE HCL 200 MCG/2ML IV SOLN
INTRAVENOUS | Status: DC | PRN
  Administered 2013-09-30: 10 ug via INTRAVENOUS

## 2013-09-30 MED ORDER — PROPOFOL 10 MG/ML IV BOLUS
INTRAVENOUS | Status: AC
  Filled 2013-09-30: qty 20

## 2013-09-30 MED ORDER — PHENYLEPHRINE HCL 10 MG/ML IJ SOLN
10.0000 mg | INTRAMUSCULAR | Status: DC | PRN
  Administered 2013-09-30: 20 ug/min via INTRAVENOUS

## 2013-09-30 MED ORDER — ATROPINE SULFATE 0.1 MG/ML IJ SOLN
0.2000 mg | Freq: Once | INTRAMUSCULAR | Status: AC
  Administered 2013-09-30: 0.2 mg via INTRAVENOUS

## 2013-09-30 MED ORDER — GLYCOPYRROLATE 0.2 MG/ML IJ SOLN
INTRAMUSCULAR | Status: AC
  Filled 2013-09-30: qty 2

## 2013-09-30 MED ORDER — ACETAMINOPHEN 325 MG PO TABS
325.0000 mg | ORAL_TABLET | ORAL | Status: DC | PRN
  Administered 2013-09-30 – 2013-10-01 (×4): 650 mg via ORAL
  Filled 2013-09-30 (×4): qty 2

## 2013-09-30 MED ORDER — ONDANSETRON HCL 4 MG/2ML IJ SOLN
INTRAMUSCULAR | Status: DC | PRN
  Administered 2013-09-30: 4 mg via INTRAVENOUS

## 2013-09-30 MED ORDER — MUPIROCIN 2 % EX OINT
TOPICAL_OINTMENT | Freq: Two times a day (BID) | CUTANEOUS | Status: DC
  Administered 2013-09-30: 1 via NASAL

## 2013-09-30 MED ORDER — ACETAMINOPHEN 10 MG/ML IV SOLN
INTRAVENOUS | Status: DC | PRN
  Administered 2013-09-30: 1000 mg via INTRAVENOUS

## 2013-09-30 MED ORDER — METOPROLOL TARTRATE 1 MG/ML IV SOLN: 2.0000 mg | INTRAVENOUS | Status: DC | PRN

## 2013-09-30 MED ORDER — MUPIROCIN 2 % EX OINT
TOPICAL_OINTMENT | CUTANEOUS | Status: AC
  Filled 2013-09-30: qty 22

## 2013-09-30 MED ORDER — PROTAMINE SULFATE 10 MG/ML IV SOLN
INTRAVENOUS | Status: AC
  Filled 2013-09-30: qty 5

## 2013-09-30 MED ORDER — PHENYLEPHRINE HCL 10 MG/ML IJ SOLN
INTRAMUSCULAR | Status: DC | PRN
  Administered 2013-09-30: 80 ug via INTRAVENOUS

## 2013-09-30 MED ORDER — ATROPINE SULFATE 0.1 MG/ML IJ SOLN
INTRAMUSCULAR | Status: AC
  Filled 2013-09-30: qty 10

## 2013-09-30 MED ORDER — CYCLOSPORINE 0.05 % OP EMUL
1.0000 [drp] | Freq: Every day | OPHTHALMIC | Status: DC
  Administered 2013-09-30: 1 [drp] via OPHTHALMIC
  Filled 2013-09-30 (×2): qty 1

## 2013-09-30 MED ORDER — ROCURONIUM BROMIDE 100 MG/10ML IV SOLN
INTRAVENOUS | Status: DC | PRN
  Administered 2013-09-30 (×2): 10 mg via INTRAVENOUS
  Administered 2013-09-30: 30 mg via INTRAVENOUS

## 2013-09-30 MED ORDER — SODIUM CHLORIDE 0.9 % IV SOLN
500.0000 mL | Freq: Once | INTRAVENOUS | Status: AC | PRN
  Administered 2013-10-01: 500 mL via INTRAVENOUS

## 2013-09-30 MED ORDER — LORATADINE 10 MG PO TABS
10.0000 mg | ORAL_TABLET | Freq: Every day | ORAL | Status: DC | PRN
  Filled 2013-09-30: qty 1

## 2013-09-30 MED ORDER — ONDANSETRON HCL 4 MG/2ML IJ SOLN: 4.0000 mg | Freq: Four times a day (QID) | INTRAMUSCULAR | Status: DC | PRN

## 2013-09-30 MED ORDER — FENTANYL CITRATE 0.05 MG/ML IJ SOLN
INTRAMUSCULAR | Status: AC
  Filled 2013-09-30: qty 5

## 2013-09-30 MED ORDER — ACYCLOVIR 200 MG PO CAPS
400.0000 mg | ORAL_CAPSULE | Freq: Every day | ORAL | Status: DC
  Administered 2013-09-30: 400 mg via ORAL
  Filled 2013-09-30 (×2): qty 2

## 2013-09-30 MED ORDER — GUAIFENESIN-DM 100-10 MG/5ML PO SYRP: 15.0000 mL | ORAL_SOLUTION | ORAL | Status: DC | PRN

## 2013-09-30 MED ORDER — SODIUM CHLORIDE 0.9 % IR SOLN
Status: DC | PRN
  Administered 2013-09-30: 09:00:00

## 2013-09-30 MED ORDER — METOPROLOL TARTRATE 12.5 MG HALF TABLET
12.5000 mg | ORAL_TABLET | Freq: Two times a day (BID) | ORAL | Status: DC
  Filled 2013-09-30 (×3): qty 1

## 2013-09-30 MED ORDER — ENOXAPARIN SODIUM 30 MG/0.3ML ~~LOC~~ SOLN
30.0000 mg | SUBCUTANEOUS | Status: DC
  Filled 2013-09-30: qty 0.3

## 2013-09-30 MED ORDER — EPHEDRINE SULFATE 50 MG/ML IJ SOLN
INTRAMUSCULAR | Status: DC | PRN
  Administered 2013-09-30: 2.5 mg via INTRAVENOUS
  Administered 2013-09-30: 5 mg via INTRAVENOUS
  Administered 2013-09-30: 2.5 mg via INTRAVENOUS
  Administered 2013-09-30: 5 mg via INTRAVENOUS

## 2013-09-30 MED ORDER — FENTANYL CITRATE 0.05 MG/ML IJ SOLN
INTRAMUSCULAR | Status: DC | PRN
  Administered 2013-09-30: 100 ug via INTRAVENOUS
  Administered 2013-09-30: 50 ug via INTRAVENOUS

## 2013-09-30 MED ORDER — ASPIRIN EC 325 MG PO TBEC: 325.0000 mg | DELAYED_RELEASE_TABLET | Freq: Every day | ORAL | Status: DC

## 2013-09-30 MED ORDER — LABETALOL HCL 5 MG/ML IV SOLN
INTRAVENOUS | Status: DC | PRN
  Administered 2013-09-30: 10 mg via INTRAVENOUS

## 2013-09-30 MED ORDER — GLYCOPYRROLATE 0.2 MG/ML IJ SOLN
INTRAMUSCULAR | Status: DC | PRN
  Administered 2013-09-30: .4 mg via INTRAVENOUS

## 2013-09-30 MED ORDER — NEOSTIGMINE METHYLSULFATE 10 MG/10ML IV SOLN
INTRAVENOUS | Status: DC | PRN
  Administered 2013-09-30: 3 mg via INTRAVENOUS

## 2013-09-30 MED ORDER — EPHEDRINE SULFATE 50 MG/ML IJ SOLN
INTRAMUSCULAR | Status: AC
  Filled 2013-09-30: qty 1

## 2013-09-30 MED ORDER — NEOSTIGMINE METHYLSULFATE 10 MG/10ML IV SOLN
INTRAVENOUS | Status: AC
  Filled 2013-09-30: qty 1

## 2013-09-30 MED ORDER — MIDAZOLAM HCL 2 MG/2ML IJ SOLN
INTRAMUSCULAR | Status: AC
  Filled 2013-09-30: qty 2

## 2013-09-30 MED ORDER — OXYCODONE HCL 5 MG PO TABS: 5.0000 mg | ORAL_TABLET | ORAL | Status: DC | PRN

## 2013-09-30 MED ORDER — LABETALOL HCL 5 MG/ML IV SOLN
INTRAVENOUS | Status: AC
  Filled 2013-09-30: qty 8

## 2013-09-30 MED ORDER — ACETAMINOPHEN 10 MG/ML IV SOLN
1000.0000 mg | Freq: Once | INTRAVENOUS | Status: DC
  Filled 2013-09-30: qty 100

## 2013-09-30 MED ORDER — LIDOCAINE HCL (CARDIAC) 20 MG/ML IV SOLN
INTRAVENOUS | Status: AC
  Filled 2013-09-30: qty 5

## 2013-09-30 MED ORDER — LACTATED RINGERS IV SOLN
INTRAVENOUS | Status: DC | PRN
  Administered 2013-09-30 (×2): via INTRAVENOUS

## 2013-09-30 MED ORDER — HYDRALAZINE HCL 20 MG/ML IJ SOLN
10.0000 mg | INTRAMUSCULAR | Status: DC | PRN
Start: 2013-09-30 — End: 2013-10-01

## 2013-09-30 MED ORDER — LABETALOL HCL 5 MG/ML IV SOLN: 10.0000 mg | INTRAVENOUS | Status: DC | PRN

## 2013-09-30 SURGICAL SUPPLY — 49 items
ADH SKN CLS APL DERMABOND .7 (GAUZE/BANDAGES/DRESSINGS) ×1
BAG DECANTER FOR FLEXI CONT (MISCELLANEOUS) ×2 IMPLANT
BLADE SURG 10 STRL SS (BLADE) ×2 IMPLANT
CANISTER SUCTION 2500CC (MISCELLANEOUS) ×2 IMPLANT
CANNULA VESSEL 3MM 2 BLNT TIP (CANNULA) ×2 IMPLANT
CATH ROBINSON RED A/P 18FR (CATHETERS) ×2 IMPLANT
CLIP TI MEDIUM 24 (CLIP) ×2 IMPLANT
CLIP TI WIDE RED SMALL 24 (CLIP) ×2 IMPLANT
CRADLE DONUT ADULT HEAD (MISCELLANEOUS) ×2 IMPLANT
DERMABOND ADVANCED (GAUZE/BANDAGES/DRESSINGS) ×1
DERMABOND ADVANCED .7 DNX12 (GAUZE/BANDAGES/DRESSINGS) ×1 IMPLANT
DRAIN CHANNEL 15F RND FF W/TCR (WOUND CARE) IMPLANT
ELECT REM PT RETURN 9FT ADLT (ELECTROSURGICAL) ×2
ELECTRODE REM PT RTRN 9FT ADLT (ELECTROSURGICAL) ×1 IMPLANT
EVACUATOR SILICONE 100CC (DRAIN) IMPLANT
GAUZE SPONGE 4X4 12PLY STRL (GAUZE/BANDAGES/DRESSINGS) ×2 IMPLANT
GLOVE BIO SURGEON STRL SZ 6.5 (GLOVE) ×2 IMPLANT
GLOVE BIO SURGEON STRL SZ7.5 (GLOVE) ×2 IMPLANT
GLOVE BIOGEL PI IND STRL 6.5 (GLOVE) IMPLANT
GLOVE BIOGEL PI IND STRL 8 (GLOVE) ×1 IMPLANT
GLOVE BIOGEL PI INDICATOR 6.5 (GLOVE) ×1
GLOVE BIOGEL PI INDICATOR 8 (GLOVE) ×1
GLOVE SKINSENSE NS SZ7.0 (GLOVE) ×2
GLOVE SKINSENSE STRL SZ7.0 (GLOVE) ×2 IMPLANT
GOWN BRE IMP SLV AUR XL STRL (GOWN DISPOSABLE) ×4 IMPLANT
GOWN STRL REUS W/ TWL LRG LVL3 (GOWN DISPOSABLE) ×3 IMPLANT
GOWN STRL REUS W/TWL LRG LVL3 (GOWN DISPOSABLE) ×6
KIT BASIN OR (CUSTOM PROCEDURE TRAY) ×2 IMPLANT
KIT ROOM TURNOVER OR (KITS) ×2 IMPLANT
NDL HYPO 25X1 1.5 SAFETY (NEEDLE) ×1 IMPLANT
NEEDLE HYPO 25X1 1.5 SAFETY (NEEDLE) ×2 IMPLANT
NS IRRIG 1000ML POUR BTL (IV SOLUTION) ×4 IMPLANT
PACK CAROTID (CUSTOM PROCEDURE TRAY) ×2 IMPLANT
PAD ARMBOARD 7.5X6 YLW CONV (MISCELLANEOUS) ×4 IMPLANT
PATCH VASCULAR VASCU GUARD 1X6 (Vascular Products) ×1 IMPLANT
SHUNT CAROTID BYPASS 10 (VASCULAR PRODUCTS) ×1 IMPLANT
SHUNT CAROTID BYPASS 12FRX15.5 (VASCULAR PRODUCTS) IMPLANT
SPONGE INTESTINAL PEANUT (DISPOSABLE) ×2 IMPLANT
SPONGE SURGIFOAM ABS GEL 100 (HEMOSTASIS) IMPLANT
SUT PROLENE 6 0 BV (SUTURE) ×2 IMPLANT
SUT PROLENE 7 0 BV 1 (SUTURE) IMPLANT
SUT SILK 2 0 FS (SUTURE) IMPLANT
SUT SILK 3 0 TIES 17X18 (SUTURE)
SUT SILK 3-0 18XBRD TIE BLK (SUTURE) IMPLANT
SUT VIC AB 3-0 SH 27 (SUTURE) ×2
SUT VIC AB 3-0 SH 27X BRD (SUTURE) ×1 IMPLANT
SUT VICRYL 4-0 PS2 18IN ABS (SUTURE) ×2 IMPLANT
SYR CONTROL 10ML LL (SYRINGE) ×2 IMPLANT
WATER STERILE IRR 1000ML POUR (IV SOLUTION) ×2 IMPLANT

## 2013-09-30 NOTE — Progress Notes (Signed)
ANTIBIOTIC CONSULT NOTE - INITIAL  Pharmacy Consult for renal adjust antibiotics Indication:   Allergies  Allergen Reactions  . Statins     Simvastatin (myalgias), Crestor (myalgias), Lipitor (with PCP in past had myalgias - tolerating this well currently)  . Zetia [Ezetimibe]     Muscle aches    Patient Measurements: Height:  (180.3 cm) Weight: 167 lb (75.751 kg) IBW/kg (Calculated) : 75.3   Vital Signs: Temp: 98.6 F (37 C) (09/01 1415) Temp src: Oral (09/01 0753) BP: 151/85 mmHg (09/01 1415) Pulse Rate: 65 (09/01 1415) Intake/Output from previous day:   Intake/Output from this shift: Total I/O In: 2128 [P.O.:120; I.V.:2008] Out: -   Labs:  Recent Labs  09/30/13 0740  HGB 16.0   Estimated Creatinine Clearance: 76.9 ml/min (by C-G formula based on Cr of 1.17). No results found for this basename: VANCOTROUGH, Leodis Binet, VANCORANDOM, GENTTROUGH, GENTPEAK, GENTRANDOM, TOBRATROUGH, TOBRAPEAK, TOBRARND, AMIKACINPEAK, AMIKACINTROU, AMIKACIN,  in the last 72 hours   Microbiology: Recent Results (from the past 720 hour(s))  SURGICAL PCR SCREEN     Status: Abnormal   Collection Time    09/30/13  8:03 AM      Result Value Ref Range Status   MRSA, PCR NEGATIVE  NEGATIVE Final   Staphylococcus aureus POSITIVE (*) NEGATIVE Final   Comment:            The Xpert SA Assay (FDA     approved for NASAL specimens     in patients over 2 years of age),     is one component of     a comprehensive surveillance     program.  Test performance has     been validated by [employer] for patients greater     than or equal to 46 year old.     It is not intended     to diagnose infection nor to     guide or monitor treatment.    Medical History: Past Medical History  Diagnosis Date  . Heart burn   . Hyperlipidemia     statin intolerant  . Familial hyperlipidemia     LDL 212 on 01/12/13  . SEMI (subendocardial myocardial infarction)   . Anxiety   . Atypical chest  pain   . GERD (gastroesophageal reflux disease)     Medications:  Prescriptions prior to admission  Medication Sig Dispense Refill  . acyclovir (ZOVIRAX) 200 MG capsule Take 400 mg by mouth at bedtime.      Marland Kitchen aspirin EC 325 MG EC tablet Take 1 tablet (325 mg total) by mouth daily.      Marland Kitchen atorvastatin (LIPITOR) 40 MG tablet Take 1 tablet (40 mg total) by mouth daily at 6 PM.  30 tablet  11  . clonazePAM (KLONOPIN) 0.5 MG tablet Take 0.5 mg by mouth at bedtime as needed for anxiety.       . Coenzyme Q10 (CO Q-10) 100 MG CAPS Take 400 mg by mouth daily.    0  . esomeprazole (NEXIUM) 40 MG capsule Take 40 mg by mouth daily at 12 noon.      Marland Kitchen ibuprofen (ADVIL,MOTRIN) 200 MG tablet Take 400 mg by mouth every 6 (six) hours as needed for moderate pain.      . metoprolol tartrate (LOPRESSOR) 25 MG tablet Take 12.5 mg by mouth 2 (two) times daily.      . RESTASIS 0.05 % ophthalmic emulsion Place 1 drop into both eyes at bedtime. daily      .  loratadine (CLARITIN) 10 MG tablet Take 10 mg by mouth daily as needed for allergies.        Assessment: 53 yo man s/p R CEA to receive antibiotics postop.  His CrCl ~75 ml/min  Goal of Therapy:  Appropriate dosing of antibiotics  Plan:  Cont Zinacef orders which are appropriate for renal function.  Andrew Sampson 09/30/2013,3:05 PM

## 2013-09-30 NOTE — Progress Notes (Signed)
S/P R CEA.  Pt. Doing well, alert and oriented, moving all extrimities.  BP running in mid 80's systolic.  Pulse normal.  No chest pain or EKG changes.  Given fluid bolus, 500, without much improvement.  Oxygen SAT good.  Has been this way for about an hour now.  Had also been given small dose .  of atropine to increase pulse from 50's to 70's.  Was going to be transferred, and dopamine at 42mcg/kg started.  Pt. Became nauseated and BP increased to 150 systolic.  Dopamine DC'd by my order for now,  and nausea RX'd with zofran.

## 2013-09-30 NOTE — Transfer of Care (Signed)
Immediate Anesthesia Transfer of Care Note  Patient: Andrew Sampson  Procedure(s) Performed: Procedure(s): ENDARTERECTOMY CAROTID (Right)  Patient Location: PACU  Anesthesia Type:General  Level of Consciousness: awake, alert  and oriented  Airway & Oxygen Therapy: Patient Spontanous Breathing  Post-op Assessment: Report given to PACU RN, Post -op Vital signs reviewed and stable, Patient moving all extremities X 4 and Patient able to stick tongue midline  Post vital signs: Reviewed and stable  Complications: No apparent anesthesia complications

## 2013-09-30 NOTE — Telephone Encounter (Signed)
Message copied by Fredrich Birks on Tue Sep 30, 2013  1:16 PM ------      Message from: Sharee Pimple      Created: Tue Sep 30, 2013 10:51 AM      Regarding: Schedule                   ----- Message -----         From: Dara Lords, PA-C         Sent: 09/30/2013  10:25 AM           To: [facility] Charge Pool            S/p right CEA 09/30/13.  F/u with [facility] in 2 weeks.            Thanks,            Lelon Mast ------

## 2013-09-30 NOTE — H&P (View-Only) (Signed)
 [facility] [place]  Patient name: Andrew Sampson MRN: [id number] DOB: 04/21/1960 Sex: male  REASON FOR CONSULT: Symptomatic right carotid stenosis  HPI: Andrew Sampson is a 53 y.o. male who was admitted on 09/22/2013 after he developed the sudden onset of left arm paresthesias and left-sided facial drooping, in addition to slurred speech. On the morning of admission, he developed paresthesias in the left arm which lasted only briefly. He suddenly developed facial drooping on the left which lasted approximately 5 minutes. He then had a second episode of left facial drooping and also some difficulty speaking. He is right-handed. He denies any previous history of stroke, TIAs, expressive or receptive aphasia, or amaurosis fugax. His workup included a carotid duplex scan which showed bilateral 60-79% carotid stenoses. Vascular surgery was consult for further recommendations.  His risk factors for vascular disease include a strong family history of  Premature cardiovascular disease on his mother's side. In addition he has hypercholesterolemia. He denies any history of diabetes, hypertension, or smoking history.  He was on aspirin and was on a statin. He has had no further symptoms.  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 He has had no chest pain or chest pressure since that time.  Past Medical History  Diagnosis Date  . Heart burn   . Hyperlipidemia     statin intolerant  . Familial hyperlipidemia     LDL 212 on 01/12/13  . SEMI (subendocardial myocardial infarction)   . Anxiety   . Atypical chest pain   . GERD (gastroesophageal reflux disease)    Family History  Problem Relation Age of Onset  . Heart attack Father   . Heart disease Father   . Hypertension Father   . Hyperlipidemia Father   There is a history of premature cardiovascular disease on his mother side.  SOCIAL HISTORY: History  Substance Use  Topics  . Smoking status: Never Smoker   . Smokeless tobacco: Never Used  . Alcohol Use: Yes     Comment: occasional  He is married and has 3 children.  Allergies  Allergen Reactions  . Statins     Simvastatin (myalgias), Crestor (myalgias), Lipitor (with PCP in past had myalgias - tolerating this well currently)  . Zetia [Ezetimibe]     Muscle aches   Current Facility-Administered Medications  Medication Dose Route Frequency Provider Last Rate Last Dose  . acetaminophen (TYLENOL) tablet 650 mg  650 mg Oral Q6H PRN Stephen K Chiu, MD       Or  . acetaminophen (TYLENOL) suppository 650 mg  650 mg Rectal Q6H PRN Stephen K Chiu, MD      . aspirin EC tablet 325 mg  325 mg Oral Daily Stephen K Chiu, MD   325 mg at 09/22/13 1347  . atorvastatin (LIPITOR) tablet 40 mg  40 mg Oral q1800 Stephen K Chiu, MD   40 mg at 09/22/13 1829  . clonazePAM (KLONOPIN) tablet 0.5 mg  0.5 mg Oral TID PRN Stephen K Chiu, MD      . ibuprofen (ADVIL,MOTRIN) tablet 400 mg  400 mg Oral Q6H PRN Stephen K Chiu, MD      . metoprolol tartrate (LOPRESSOR) tablet 12.5 mg  12.5 mg Oral BID Stephen K Chiu, MD   12.5 mg at 09/23/13 1135  . morphine 2 MG/ML injection 2 mg  2 mg Intravenous Q4H PRN Stephen K Chiu, MD      .   ondansetron (ZOFRAN) tablet 4 mg  4 mg Oral Q6H PRN Stephen K Chiu, MD       Or  . ondansetron (ZOFRAN) injection 4 mg  4 mg Intravenous Q6H PRN Stephen K Chiu, MD      . pantoprazole (PROTONIX) EC tablet 40 mg  40 mg Oral Daily Stephen K Chiu, MD   40 mg at 09/23/13 1136  . sodium chloride 0.9 % injection 3 mL  3 mL Intravenous Q12H Stephen K Chiu, MD   3 mL at 09/22/13 2346   REVIEW OF SYSTEMS: [X ] denotes positive finding; [  ] denotes negative finding CARDIOVASCULAR:  [ ] chest pain   [ ] chest pressure   [ ] palpitations   [ ] orthopnea   [ ] dyspnea on exertion   [ ] claudication   [ ] rest pain   [ ] DVT   [ ] phlebitis PULMONARY:   [ ] productive cough   [ ] asthma   [ ] wheezing NEUROLOGIC:    [ ] weakness  [X ] paresthesias  [X ] aphasia  [ ] amaurosis  [ ] dizziness HEMATOLOGIC:   [ ] bleeding problems   [ ] clotting disorders MUSCULOSKELETAL:  [ ] joint pain   [ ] joint swelling [ ] leg swelling GASTROINTESTINAL: [ ]  blood in stool  [ ]  hematemesis GENITOURINARY:  [ ]  dysuria  [ ]  hematuria PSYCHIATRIC:  [ ] history of major depression INTEGUMENTARY:  [ ] rashes  [ ] ulcers CONSTITUTIONAL:  [ ] fever   [ ] chills  PHYSICAL EXAM: Filed Vitals:   09/23/13 0200 09/23/13 0400 09/23/13 0600 09/23/13 1006  BP: 121/74 124/73 121/75 143/86  Pulse: 59 72 68 64  Temp:    99 F (37.2 C)  TempSrc:    Oral  Resp: 18 16 16 16  Height:      Weight:      SpO2: 98% 97% 96% 97%   Body mass index is 23.3 kg/(m^2). GENERAL: The patient is a well-nourished male, in no acute distress. The vital signs are documented above. CARDIOVASCULAR: There is a regular rate and rhythm. I do not detect carotid bruits. He has palpable radial, femoral, popliteal, and pedal pulses bilaterally. He has no significant lower extremity swelling. PULMONARY: There is good air exchange bilaterally without wheezing or rales. ABDOMEN: Soft and non-tender with normal pitched bowel sounds.  MUSCULOSKELETAL: There are no major deformities or cyanosis. NEUROLOGIC: No focal weakness or paresthesias are detected. SKIN: There are no ulcers or rashes noted. PSYCHIATRIC: The patient has a normal affect.  DATA:  Lab Results  Component Value Date   WBC 6.4 09/23/2013   HGB 14.4 09/23/2013   HCT 42.8 09/23/2013   MCV 88.2 09/23/2013   PLT 238 09/23/2013   Lab Results  Component Value Date   NA 139 09/23/2013   K 4.2 09/23/2013   CL 101 09/23/2013   CO2 26 09/23/2013   Lab Results  Component Value Date   CREATININE 1.17 09/23/2013   Lab Results  Component Value Date   INR 1.09 09/22/2013   INR 1.37 01/15/2013   INR 1.11 01/14/2013   Lab Results  Component Value Date   HGBA1C 6.2* 09/23/2013   CBG (last 3)    Recent Labs  09/22/13 1127  GLUCAP 113*   CAROTID DUPLEX: I have reviewed his carotid duplex scan. The systolic velocity on the right is 187 cm/s with an   end-diastolic velocity of 65 cm/s. Thus the stenosis on the right appears to be approximately 60%. Likewise he has an approximately 60% stenosis on the left by duplex.  CT OF THE HEAD ON 09/22/2013: No acute intracranial abnormalities.   CT ANGIOGRAM NECK ON 09/23/2013: 60% stenosis at the origin of the right internal carotid artery with soft plaque. A small amount of plaque in the left carotid artery with no significant stenosis noted. 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 There is soft plaque in the proximal left subclavian artery but no significant stenosis of the left vertebral artery noted.  2-D ECHO ON 09/22/2013: The patient has normal systolic function with an ejection fraction estimated at 50-55%.  MEDICAL ISSUES: SYMPTOMATIC 60% RIGHT CAROTID STENOSIS: The patient's episodes of facial drooping on the left and left upper extremity paresthesias I think are most likely related to the soft plaque noted in his right carotid artery. Although the stenosis is only 60%, the plaque is noted to be soft by CT. These plaques are more likely to embolize and I suspect that he had an embolic event from his right carotid stenosis. His workup otherwise shows no etiology for his symptoms. He does have a history of some cervical disc problems but certainly this would not explain facial drooping on the left. This reason I have recommended right carotid endarterectomy which has been scheduled for 09/30/2013. He can be discharged from our standpoint and I will bring him back next Tuesday for right carotid endarterectomy. I have reviewed the indications for carotid endarterectomy, that is to lower the risk of future stroke. I have also reviewed the potential complications of surgery, including but not limited to:  bleeding, stroke (perioperative risk 1-2%), MI, nerve injury of other unpredictable medical problems. All of the patients questions were answered and they are agreeable to proceed with surgery.  He does know to continue taking his aspirin and statin.  I spent 60 minutes with the patient and have answered all of his questions.  Griffen Frayne S [facility] [place] Beeper: [phone]   

## 2013-09-30 NOTE — Interval H&P Note (Signed)
History and Physical Interval Note:  09/30/2013 8:53 AM  Andrew Sampson  has presented today for surgery, with the diagnosis of Occlusion and stenosis of carotid artery without mention of cerebral infarction   The various methods of treatment have been discussed with the patient and family. After consideration of risks, benefits and other options for treatment, the patient has consented to  Procedure(s): ENDARTERECTOMY CAROTID (Right) as a surgical intervention .  The patient's history has been reviewed, patient examined, no change in status, stable for surgery.  I have reviewed the patient's chart and labs.  Questions were answered to the patient's satisfaction.     DICKSON,CHRISTOPHER S

## 2013-09-30 NOTE — Telephone Encounter (Signed)
Lm for pt on cell #, mailed letter, dpm

## 2013-09-30 NOTE — Op Note (Signed)
    NAME: DEONTAY LADNIER    MRN: [id number] DOB: [birth year]    DATE OF OPERATION: 09/30/2013  PREOP DIAGNOSIS: symptomatic right carotid stenosis  POSTOP DIAGNOSIS: same  PROCEDURE: Right carotid endarterectomy with bovine pericardial patch angioplasty  SURGEON: Di Kindle. Edilia Bo, MD, FACS  ASSIST: Leonides Sake, MD, Doreatha Massed, [place]  ANESTHESIA: Gen.   EBL: minimal  INDICATIONS: HOBIE KOHLES is a 53 y.o. male who presented with a right brain TIA and had a 60% right carotid stenosis. By duplex this appeared to be fairly soft plaque and was felt to be the culprit lesion. He presents for elective right carotid endarterectomy.  FINDINGS: The patient had a hemorrhagic plaque. The stenosis was approximately 60%.  TECHNIQUE: The patient was taken to the operating room and received a general anesthetic. The right neck was prepped and draped in the usual sterile fashion. The skin was infiltrated with lidocaine with epinephrine for hemostatic purposes. An incision was made along the anterior border of the sternocleidomastoid and dissection carried down to the common carotid artery which was dissected free and controlled with a Rummel tourniquet. The facial vein was divided between 2-0 silk ties. The internal carotid artery was controlled above the plaque. The external carotid artery and superior thyroid arteries were also controlled. The patient was heparinized. Clamps were then placed on the internal then common then the external carotid artery. A longitudinal arteriotomy was made in the common carotid artery and this was extended through the plaque into the normal internal carotid artery. A 10 shunt was placed into the internal carotid artery, backbled and then placed into the common carotid artery and secured with Rummel tourniquet. Flow was reestablished to the shunt. An endarterectomy plane was established proximally and the plaque was sharply divided. Eversion endarterectomy was performed of the  external carotid artery. Distally there was a nice tapering the plaque and no tacking sutures were required. The artery was irrigated with copious amounts of heparin and all loose debris removed. A bovine pericardial patch was selected and this was cut the appropriate size and then sewn using continuous 6-0 Prolene suture. Prior to completing the patch closure, the shunt was removed. The arteries were backbled and flushed properly and the anastomosis completed. Flow was reestablished first to the external carotid artery and into the internal carotid artery. At the completion was a good Doppler signal distal patch and a good pulse. The heparin was partially reversed with protamine. Hemostasis was obtained in the wound. The deep layer was closed with running 3-0 Vicryl. The platysma was closed with running 3-0 Vicryl. The skin was closed with 4-0 subcuticular stitch. Dermabond was applied. The patient tolerated the procedure well and was transferred to the recovery room in stable condition. All needle and sponge counts were correct.  Waverly Ferrari, MD, FACS [facility]  DATE OF DICTATION:   09/30/2013

## 2013-09-30 NOTE — Progress Notes (Signed)
Utilization review completed.  

## 2013-09-30 NOTE — Anesthesia Postprocedure Evaluation (Signed)
  Anesthesia Post-op Note  Patient: Andrew Sampson  Procedure(s) Performed: Procedure(s): Right Carotid Endarterectomy with Patch (Right)  Patient Location: PACU  Anesthesia Type:General  Level of Consciousness: awake, alert  and oriented  Airway and Oxygen Therapy: Patient Spontanous Breathing and Patient connected to nasal cannula oxygen  Post-op Pain: mild  Post-op Assessment: Post-op Vital signs reviewed and Respiratory Function Stable  Post-op Vital Signs: Reviewed and stable  Last Vitals:  Filed Vitals:   09/30/13 1415  BP: 151/85  Pulse: 65  Temp: 37 C  Resp: 19    Complications: No apparent anesthesia complications

## 2013-09-30 NOTE — Progress Notes (Signed)
   VASCULAR SURGERY ASSESSMENT & PLAN:  * Doing well post op.  * Anticipate D/C in AM.   SUBJECTIVE: No complaints.   PHYSICAL EXAM: Filed Vitals:   09/30/13 1443 09/30/13 1500 09/30/13 1515 09/30/13 1530  BP: 104/63     Pulse: 62 45 54 70  Temp: 98.4 F (36.9 C)     TempSrc: Oral     Resp: Height:  (1.803 m)     Weight: 174 lb 9.7 oz (79.2 kg)     SpO2: 98% 98% 97% 98%   Neuro intact. Incision looks fine.   LABS: Lab Results  Component Value Date   WBC 6.4 09/23/2013   HGB 16.0 09/30/2013   HCT 47.0 09/30/2013   MCV 88.2 09/23/2013   PLT 238 09/23/2013   Lab Results  Component Value Date   CREATININE 1.17 09/23/2013   Lab Results  Component Value Date   INR 1.09 09/22/2013   Active Problems:   Carotid stenosis  Cari Caraway Beeper: [phone] 09/30/2013

## 2013-10-01 LAB — BASIC METABOLIC PANEL
Anion gap: 12 (ref 5–15)
BUN: 13 mg/dL (ref 6–23)
CO2: 23 mEq/L (ref 19–32)
Calcium: 8.2 mg/dL — ABNORMAL LOW (ref 8.4–10.5)
Chloride: 106 mEq/L (ref 96–112)
Creatinine, Ser: 1.18 mg/dL (ref 0.50–1.35)
GFR calc Af Amer: 79 mL/min — ABNORMAL LOW (ref >90)
GFR calc non Af Amer: 68 mL/min — ABNORMAL LOW (ref >90)
GLUCOSE: 108 mg/dL — AB (ref 70–99)
POTASSIUM: 4 meq/L (ref 3.7–5.3)
Sodium: 141 mEq/L (ref 137–147)

## 2013-10-01 LAB — CBC
HEMATOCRIT: 37 % — AB (ref 39.0–52.0)
HEMOGLOBIN: 12.2 g/dL — AB (ref 13.0–17.0)
MCH: 30 pg (ref 26.0–34.0)
MCHC: 33 g/dL (ref 30.0–36.0)
MCV: 90.9 fL (ref 78.0–100.0)
Platelets: 203 10*3/uL (ref 150–400)
RBC: 4.07 MIL/uL — ABNORMAL LOW (ref 4.22–5.81)
RDW: 12.8 % (ref 11.5–15.5)
WBC: 6.1 10*3/uL (ref 4.0–10.5)

## 2013-10-01 NOTE — Progress Notes (Signed)
   VASCULAR SURGERY ASSESSMENT & PLAN:  * 1 Day Post-Op s/p: Right CEA  * Mild posterior headache. No issues with hypertension.  * BP had bee on low side last PM, but HR and BP are good this AM  * VQI: is on statin and is on ASA.  * Home today.     SUBJECTIVE: Mild posterior HA. Otherwise no complaints.   PHYSICAL EXAM: Filed Vitals:   09/30/13 2200 09/30/13 2330 10/01/13 0336 10/01/13 0400  BP: 98/61  Pulse: 53 58 69 60  Temp:  99 F (37.2 C) 99.8 F (37.7 C)   TempSrc:      Resp:  Height:      Weight:      SpO2:  98% 95% 96%   Incision looks fine. Neuro intact  LABS: Lab Results  Component Value Date   WBC 6.1 10/01/2013   HGB 12.2* 10/01/2013   HCT 37.0* 10/01/2013   MCV 90.9 10/01/2013   PLT 203 10/01/2013   Lab Results  Component Value Date   CREATININE 1.18 10/01/2013   Lab Results  Component Value Date   INR 1.09 09/22/2013   Active Problems:   Carotid stenosis  Cari Caraway Beeper: [phone] 10/01/2013

## 2013-10-01 NOTE — Progress Notes (Signed)
Andrew Sampson to be D/C'd Home per MD order.  Discussed with the patient and all questions fully answered.    Medication List         acyclovir 200 MG capsule  Commonly known as:  ZOVIRAX  Take 400 mg by mouth at bedtime.     aspirin 325 MG EC tablet  Take 1 tablet (325 mg total) by mouth daily.     atorvastatin 40 MG tablet  Commonly known as:  LIPITOR  Take 1 tablet (40 mg total) by mouth daily at 6 PM.     clonazePAM 0.5 MG tablet  Commonly known as:  KLONOPIN  Take 0.5 mg by mouth at bedtime as needed for anxiety.     Co Q-10 100 MG Caps  Take 400 mg by mouth daily.     esomeprazole 40 MG capsule  Commonly known as:  NEXIUM  Take 40 mg by mouth daily at 12 noon.     ibuprofen 200 MG tablet  Commonly known as:  ADVIL,MOTRIN  Take 400 mg by mouth every 6 (six) hours as needed for moderate pain.     loratadine 10 MG tablet  Commonly known as:  CLARITIN  Take 10 mg by mouth daily as needed for allergies.     metoprolol tartrate 25 MG tablet  Commonly known as:  LOPRESSOR  Take 12.5 mg by mouth 2 (two) times daily.     oxyCODONE 5 MG immediate release tablet  Commonly known as:  ROXICODONE  Take 1 tablet (5 mg total) by mouth every 6 (six) hours as needed for severe pain.     RESTASIS 0.05 % ophthalmic emulsion  Generic drug:  cycloSPORINE  Place 1 drop into both eyes at bedtime. daily        VVS, Skin clean, dry and intact without evidence of skin break down, no evidence of skin tears noted. IV catheter discontinued intact. Site without signs and symptoms of complications. Dressing and pressure applied.  An After Visit Summary was printed and given to the patient.  D/c education completed with patient/family including follow up instructions, medication list, d/c activities limitations if indicated, with other d/c instructions as indicated by MD - patient able to verbalize understanding, all questions fully answered.   Patient instructed to return to ED, call  [phone], or call MD for any changes in condition.   Patient escorted via WC, and D/C home via private auto.  [name] 10/01/2013 8:09 AM

## 2013-10-01 NOTE — Discharge Instructions (Signed)
 Carotid Artery Disease The carotid arteries are the two main arteries on either side of the neck that supply blood to the brain. Carotid artery disease, also called carotid artery stenosis, is the narrowing or blockage of one or both carotid arteries. Carotid artery disease increases your risk for a stroke or a transient ischemic attack (TIA). A TIA is an episode in which a waxy, fatty substance that accumulates within the artery (plaque) blocks blood flow to the brain. A TIA is considered a "warning stroke."  CAUSES   Buildup of plaque inside the carotid arteries (atherosclerosis) (common).  A weakened outpouching in an artery (aneurysm).  Inflammation of the carotid artery (arteritis).  A fibrous growth within the carotid artery (fibromuscular dysplasia).  Tissue death within the carotid artery due to radiation treatment (post-radiation necrosis).  Decreased blood flow due to spasms of the carotid artery (vasospasm).  Separation of the walls of the carotid artery (carotid dissection). RISK FACTORS  High cholesterol (dyslipidemia).   High blood pressure (hypertension).   Smoking.   Obesity.   Diabetes.   Family history of cardiovascular disease.   Inactivity or lack of regular exercise.   Being male. Men have an increased risk of developing atherosclerosis earlier in life than women.  SYMPTOMS  Carotid artery disease does not cause symptoms. DIAGNOSIS Diagnosis of carotid artery disease may include:   A physical exam. Your health care provider may hear an abnormal sound (bruit) when listening to the carotid arteries.   Specific tests that look at the blood flow in the carotid arteries. These tests include:   Carotid artery ultrasonography.   Carotid or cerebral angiography.   Computerized tomographic angiography (CTA).   Magnetic resonance angiography (MRA).  TREATMENT  Treatment of carotid artery disease can include a combination of treatments.  Treatment options include:  Surgery. You may have:   A carotid endarterectomy. This is a surgery to remove the blockages in the carotid arteries.   A carotid angioplasty with stenting. This is a nonsurgical interventional procedure. A wire mesh (stent) is used to widen the blocked carotid arteries.   Medicines to control blood pressure, cholesterol, and reduce blood clotting (antiplatelet therapy).   Adjusting your diet.   Lifestyle changes such as:   Quitting smoking.   Exercising as tolerated or as directed by your health care provider.   Controlling and maintaining a good blood pressure.   Keeping cholesterol levels under control.  HOME CARE INSTRUCTIONS   Take medicines only as directed by your health care provider. Make sure you understand all your medicine instructions. Do not stop your medicines without talking to your health care provider.   Follow your health care provider's diet instructions. It is important to eat a healthy diet that is low in saturated fats and includes plenty of fresh fruits, vegetables, and lean meats. High-fat, high-sodium foods as well as foods that are fried, overly processed, or have poor nutritional value should be avoided.  Maintain a healthy weight.   Stay physically active. It is recommended that you get at least 30 minutes of activity every day.   Do not use any tobacco products including cigarettes, chewing tobacco, or electronic cigarettes. If you need help quitting, ask your health care provider.  Limit alcohol use to:   No more than 2 drinks per day for men.   No more than 1 drink per day for nonpregnant women.   Do not use illegal drugs.   Keep all follow-up visits as directed by your   health care provider.  SEEK IMMEDIATE MEDICAL CARE IF:  You develop TIA or stroke symptoms. These include:   Sudden weakness or numbness on one side of the body, such as in the face, arm, or leg.   Sudden confusion.    Trouble speaking (aphasia) or understanding.   Sudden trouble seeing out of one or both eyes.   Sudden trouble walking.   Dizziness or feeling like you might faint.   Loss of balance or coordination.   Sudden severe headache with no known cause.   Sudden trouble swallowing (dysphagia).  If you have any of these symptoms, call your local emergency services (911 in [place] Do not drive yourself to the clinic or hospital. This is a medical emergency.  Document Released: 04/10/2011 Document Revised: 06/02/2013 Document Reviewed: 07/17/2012 [employer] Patient Information 2015 [employer]. This information is not intended to replace advice given to you by your health care provider. Make sure you discuss any questions you have with your health care provider.  

## 2013-10-02 ENCOUNTER — Encounter (HOSPITAL_COMMUNITY): Payer: Self-pay | Admitting: Vascular Surgery

## 2013-10-02 NOTE — Discharge Summary (Signed)
[facility] Discharge Summary   Patient ID:  Andrew Sampson MRN: [id number] DOB/AGE: 1960/08/31 53 y.o.  Admit date: 09/30/2013 Discharge date: 10/02/2013 Date of Surgery: 09/30/2013 Surgeon: Surgeon(s): [name], MD Fransisco Hertz, MD  Admission Diagnosis: Occlusion and stenosis of carotid artery without mention of cerebral infarction   Discharge Diagnoses:  Occlusion and stenosis of carotid artery without mention of cerebral infarction   Secondary Diagnoses: Past Medical History  Diagnosis Date  . Heart burn   . Hyperlipidemia     statin intolerant  . Familial hyperlipidemia     LDL 212 on 01/12/13  . SEMI (subendocardial myocardial infarction)   . Anxiety   . Atypical chest pain   . GERD (gastroesophageal reflux disease)     Procedure(s): Right Carotid Endarterectomy with Patch  Discharged Condition: good  HPI: 53 y/o male with right Symptomatic carotid stenosis.  He was admitted on 09/22/2013 after he developed the sudden onset of left arm paresthesias and left-sided facial drooping, in addition to slurred speech. On the morning of admission, he developed paresthesias in the left arm which lasted only briefly. He suddenly developed facial drooping on the left which lasted approximately 5 minutes. He then had a second episode of left facial drooping and also some difficulty speaking. He is right-handed. He denies any previous history of stroke, TIAs, expressive or receptive aphasia, or amaurosis fugax. His workup included a carotid duplex scan which showed bilateral 60-79% carotid stenoses. Vascular surgery was consult for further recommendations.  His risk factors for vascular disease include a strong family history of Premature cardiovascular disease on his mother's side. In addition he has hypercholesterolemia. He denies any history of diabetes, hypertension, or smoking history.  He was on aspirin and was on a statin. He has had no further symptoms.   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 He has had no chest pain or chest pressure since that time.     Hospital Course:  Andrew Sampson is a 53 y.o. male is S/P Right Procedure(s): Right Carotid Endarterectomy with Patch He was admitted overnight for close observation.  He had an uneventful stay.  POD#1 he was ambulating, taking PO's well and has voided.  He will be discharged home.     Significant Diagnostic Studies: CBC Lab Results  Component Value Date   WBC 6.1 10/01/2013   HGB 12.2* 10/01/2013   HCT 37.0* 10/01/2013   MCV 90.9 10/01/2013   PLT 203 10/01/2013    BMET    Component Value Date/Time   NA 141 10/01/2013 0355   K 4.0 10/01/2013 0355   CL 106 10/01/2013 0355   CO2 23 10/01/2013 0355   GLUCOSE 108* 10/01/2013 0355   BUN 13 10/01/2013 0355   CREATININE 1.18 10/01/2013 0355   CALCIUM 8.2* 10/01/2013 0355   GFRNONAA 68* 10/01/2013 0355   GFRAA 79* 10/01/2013 0355   COAG Lab Results  Component Value Date   INR 1.09 09/22/2013   INR 1.37 01/15/2013   INR 1.11 01/14/2013     Disposition: Good Discharge to :Home Discharge Instructions   CAROTID Sugery: Call MD for difficulty swallowing or speaking; weakness in arms or legs that is a new symtom; severe headache.  If you have increased swelling in the neck and/or  are having difficulty breathing, CALL 911    Complete by:  As directed      Call MD for:  redness, tenderness, or signs  of infection (pain, swelling, bleeding, redness, odor or green/yellow discharge around incision site)    Complete by:  As directed      Call MD for:  severe or increased pain, loss or decreased feeling  in affected limb(s)    Complete by:  As directed      Call MD for:  temperature >100.5    Complete by:  As directed      Discharge patient    Complete by:  As directed   Discharge pt to home     Discharge wound care:    Complete by:  As directed   Shower daily with soap and water starting 10/02/13      Driving Restrictions    Complete by:  As directed   No driving for 2 weeks     Lifting restrictions    Complete by:  As directed   No lifting for 2 weeks     Resume previous diet    Complete by:  As directed             Medication List         acyclovir 200 MG capsule  Commonly known as:  ZOVIRAX  Take 400 mg by mouth at bedtime.     aspirin 325 MG EC tablet  Take 1 tablet (325 mg total) by mouth daily.     atorvastatin 40 MG tablet  Commonly known as:  LIPITOR  Take 1 tablet (40 mg total) by mouth daily at 6 PM.     clonazePAM 0.5 MG tablet  Commonly known as:  KLONOPIN  Take 0.5 mg by mouth at bedtime as needed for anxiety.     Co Q-10 100 MG Caps  Take 400 mg by mouth daily.     esomeprazole 40 MG capsule  Commonly known as:  NEXIUM  Take 40 mg by mouth daily at 12 noon.     ibuprofen 200 MG tablet  Commonly known as:  ADVIL,MOTRIN  Take 400 mg by mouth every 6 (six) hours as needed for moderate pain.     loratadine 10 MG tablet  Commonly known as:  CLARITIN  Take 10 mg by mouth daily as needed for allergies.     metoprolol tartrate 25 MG tablet  Commonly known as:  LOPRESSOR  Take 12.5 mg by mouth 2 (two) times daily.     oxyCODONE 5 MG immediate release tablet  Commonly known as:  ROXICODONE  Take 1 tablet (5 mg total) by mouth every 6 (six) hours as needed for severe pain.     RESTASIS 0.05 % ophthalmic emulsion  Generic drug:  cycloSPORINE  Place 1 drop into both eyes at bedtime. daily       Verbal and written Discharge instructions given to the patient. Wound care per Discharge AVS     Follow-up Information   Follow up with DICKSON,CHRISTOPHER S, MD In 2 weeks. (Office will call you to arrange your appt (sent))    Specialty:  Vascular Surgery   Contact information:   [address] [place] [address] [phone]       Signed: Clinton Gallant [facility] 10/02/2013, 4:26 PM  --- For VQI Registry use --- Instructions: Press F2 to tab  through selections.  Delete question if not applicable.   Modified Rankin score at D/C (0-6): Rankin Score=0  IV medication needed for:  1. Hypertension: No 2. Hypotension: No  Post-op Complications: No  1. Post-op CVA or TIA: No  If yes: Event classification (  right eye, left eye, right cortical, left cortical, verterobasilar, other):   If yes: Timing of event (intra-op, <6 hrs post-op, >=6 hrs post-op, unknown):   2. CN injury: No  If yes: CN  injuried   3. Myocardial infarction: No  If yes: Dx by (EKG or clinical, Troponin):   4.  CHF: No  5.  Dysrhythmia (new): No  6. Wound infection: No  7. Reperfusion symptoms: No  8. Return to OR: No  If yes: return to OR for (bleeding, neurologic, other CEA incision, other):   Discharge medications: Statin use:  Yes ASA use:  Yes Beta blocker use:  Yes ACE-Inhibitor use:  No  for medical reason   P2Y12 Antagonist use: [x ] None,  Plavix,  Plasugrel,  Ticlopinine,  Ticagrelor,  Other,  No for medical reason,  Non-compliant,  Not-indicated Anti-coagulant use:  [x ] None,  Warfarin,  Rivaroxaban,  Dabigatran,  Other,  No for medical reason,  Non-compliant,  Not-indicated

## 2013-10-03 NOTE — Discharge Summary (Signed)
Agree with plans for D/C  Waverly Ferrari, MD, [facility] Beeper [phone] 10/03/2013

## 2013-10-14 ENCOUNTER — Encounter: Payer: Self-pay | Admitting: Vascular Surgery

## 2013-10-15 ENCOUNTER — Ambulatory Visit (INDEPENDENT_AMBULATORY_CARE_PROVIDER_SITE_OTHER): Payer: Self-pay | Admitting: Vascular Surgery

## 2013-10-15 ENCOUNTER — Encounter: Payer: Self-pay | Admitting: Vascular Surgery

## 2013-10-15 VITALS — BP 129/93 | HR 82 | Ht 71.0 in | Wt 166.0 lb

## 2013-10-15 DIAGNOSIS — I6529 Occlusion and stenosis of unspecified carotid artery: Secondary | ICD-10-CM

## 2013-10-15 HISTORY — DX: Occlusion and stenosis of unspecified carotid artery: I65.29

## 2013-10-15 NOTE — Progress Notes (Signed)
   VQI PATIENT  Patient name: Andrew Sampson MRN: [id number] DOB: Dec 09, 1960 Sex: male  REASON FOR VISIT: Follow up after right carotid endarterectomy  HPI: Andrew Sampson is a 53 y.o. male who underwent a right carotid endarterectomy with bovine pericardial patch angioplasty on 09/30/2013. He had presented with a right brain TIA was found have a 60% right carotid stenosis. The plaque however was very soft and felt to be the source of his stroke. At the time of surgery, he was found to have a hemorrhagic plaque. He did well postoperatively and was discharged on postoperative day #1. He returns for his first postoperative visit.  He has a mild posterior headache. He said no focal weakness or paresthesias. He is on aspirin. He is on a statin. He is gradually resuming his normal activities.   REVIEW OF SYSTEMS: Arly.Keller ] denotes positive finding; [  ] denotes negative finding  CARDIOVASCULAR:   chest pain    dyspnea on exertion    CONSTITUTIONAL:   fever    chills  PHYSICAL EXAM: Filed Vitals:   10/15/13 1507 10/15/13 1511  BP: 131/94 129/93  Pulse: 82   Height:  (1.803 m)   Weight: 166 lb (75.297 kg)   SpO2: 98%    Body mass index is 23.16 kg/(m^2). GENERAL: The patient is a well-nourished male, in no acute distress. The vital signs are documented above. CARDIOVASCULAR: There is a regular rate and rhythm. PULMONARY: There is good air exchange bilaterally without wheezing or rales. His right neck incision is healing nicely. NEURAL: He has no focal weakness or paresthesias.  MEDICAL ISSUES: The patient is doing well status post right carotid endarterectomy. We've discussed again the importance of nutrition and exercise. He is on a statin. He is on aspirin. I think it would be reasonable for him to begin mouth and biking began approximately 3 weeks. I've ordered a fall carotid duplex scan in 6 months and I'll see him back at that time. He had only very mild left carotid stenosis.  He knows to call sooner if he has any problems.  Andrew Sampson S [facility] [place] Beeper: [phone]

## 2013-10-16 NOTE — Addendum Note (Signed)
Addended by: Adria Dill L on: 10/16/2013 09:29 AM   Modules accepted: Orders

## 2013-11-03 ENCOUNTER — Other Ambulatory Visit (INDEPENDENT_AMBULATORY_CARE_PROVIDER_SITE_OTHER): Payer: BC Managed Care – PPO | Admitting: *Deleted

## 2013-11-03 DIAGNOSIS — E7849 Other hyperlipidemia: Secondary | ICD-10-CM

## 2013-11-03 DIAGNOSIS — Z79899 Other long term (current) drug therapy: Secondary | ICD-10-CM

## 2013-11-03 LAB — HEPATIC FUNCTION PANEL
ALK PHOS: 55 U/L (ref 39–117)
ALT: 34 U/L (ref 0–53)
AST: 24 U/L (ref 0–37)
Albumin: 4.5 g/dL (ref 3.5–5.2)
BILIRUBIN DIRECT: 0.1 mg/dL (ref 0.0–0.3)
Total Bilirubin: 1.2 mg/dL (ref 0.2–1.2)
Total Protein: 7.6 g/dL (ref 6.0–8.3)

## 2013-11-05 LAB — NMR LIPOPROFILE WITH LIPIDS
Cholesterol, Total: 118 mg/dL (ref 100–199)
HDL PARTICLE NUMBER: 18.2 umol/L — AB (ref >=30.5)
HDL Size: 8.7 nm — ABNORMAL LOW (ref >=9.2)
HDL-C: 22 mg/dL — ABNORMAL LOW (ref >39)
LARGE VLDL-P: 2.5 nmol/L (ref <=2.7)
LDL (calc): 73 mg/dL (ref 0–99)
LDL Particle Number: 970 nmol/L (ref <1000)
LDL SIZE: 21.7 nm (ref >=20.8)
LP-IR Score: 49 — ABNORMAL HIGH (ref <=45)
SMALL LDL PARTICLE NUMBER: 304 nmol/L (ref <=527)
Triglycerides: 115 mg/dL (ref 0–149)
VLDL Size: 43.8 nm (ref <=46.6)

## 2013-11-06 ENCOUNTER — Telehealth: Payer: Self-pay

## 2013-11-06 ENCOUNTER — Ambulatory Visit (INDEPENDENT_AMBULATORY_CARE_PROVIDER_SITE_OTHER): Payer: BC Managed Care – PPO | Admitting: Pharmacist

## 2013-11-06 DIAGNOSIS — E785 Hyperlipidemia, unspecified: Secondary | ICD-10-CM

## 2013-11-06 NOTE — Telephone Encounter (Signed)
lmom.Chol, LDL and particle size great  HDL is low not much else to do but exercise and red wine in moderation

## 2013-11-10 NOTE — Progress Notes (Signed)
HPI  [name] is a pleasant 53 y.o. WM referred to lipid clinic by Dr. Eden EmmsNishan.  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  He has failed multiple lipid lowering medications in the past.  His baseline LDL is ~ 210 mg/dL, and seems to be of genetic origin.  He has been on atorvastatin 40 mg qd since December 2014, and tolerated this okay until he began to ride his mountain bike more.  He would get muscle aches and fatigue during exertion.  He is currently taking CoQ10 200mg  daily to help with the aches.  He was unable to increase 400mg  daily due to upset stomach.  He is concerned about his cholesterol since he recently had an endarterectomy.  He is currently taking Lipitor 40mg  daily with no issues but has concerns about what will happen when he starts to ride his bike again.  He is interested in discussing PCSK-9 inhibitors.  He also restarted fish oil 2 capsules daily at the advice of Dr. Edilia Boickson.  Pt has made several lifestyle changes since his most recent hospitalization.  He has increased his raw green vegetables, salmon and chicken.  He has cut out almost all sugar.  He eats [place] yogurt and Kind Nut Bars for extra protein.  He has not started riding his bike again yet.    RF:  CABG x 4, carotid endarterectomy, age, low HDL - LDL goal < 70 mg/dL, or at least < 409100 mg/dL  (81%50% reduction) given baseline LDL is 210 mg/dL Meds:  Atorvastatin 40 mg qd, Fish oil 2 g/d, Co-Q 10 200 mg daily. Intolerant:  Simvastatin (myalgias), Crestor (myalgias), Atorvastatin (with PCP in past  - myalgias), Zetia (fatigue and weight gain).  Took niacin in past as well but this was stopped.  Social history:  Patient doesn't use tobacco products.  He is an Pensions consultantattorney.  Family is from [place], he was born [place]. Family history:  Father had PCI in his 360's.  Grandmother developed CAD at age 55 y.o. Exercise:  Very active.  He mountain bikes three days a week for ~ 2  hours. Diet:  Patient follows a healthy, low fat diet already.  Labs:   10/2013: LDL-P number 970, LDL 73, HDL- 22, TG- 115, TC- 115, LFTs normal (atorvastatin 40mg  daily, fish oil 2 gm, Co-Q 10 200mg  daily) 06/2013:  LDL-P number 1151, LDL 77 (baseline 214), HDL 21, TG 94, TC 117, non-HDL 96, LFTs normal (Atoravastatin 40 mg qd, Lovaza 2 g/d, Co-Q 10 200 mg daily) 03/2013 ([facility]) TC 148, TG 137, HDL 26, LDL 95, non-HDL 122, LFTs normal (Atorvastatin 40 mg qd, Lovaza 2 g/d) 03/2013 - Our office after this visit:  Vitamin D 41 ng/mL (normal) 12/2012 (hospital-[facility])  TC 273, LDL 214, TG 209, HDL 17 (Lovaza 2 g/d)  Current Outpatient Prescriptions  Medication Sig Dispense Refill  . acyclovir (ZOVIRAX) 200 MG capsule Take 400 mg by mouth at bedtime.      Marland Kitchen. aspirin EC 325 MG EC tablet Take 1 tablet (325 mg total) by mouth daily.      Marland Kitchen. atorvastatin (LIPITOR) 40 MG tablet Take 1 tablet (40 mg total) by mouth daily at 6 PM.  30 tablet  11  . clonazePAM (KLONOPIN) 0.5 MG tablet Take 0.5 mg by mouth at bedtime as needed for anxiety.       . Coenzyme Q10 (CO Q-10) 100 MG CAPS Take 400 mg by mouth daily.  0  . esomeprazole (NEXIUM) 40 MG capsule Take 40 mg by mouth daily at 12 noon.      Marland Kitchen. ibuprofen (ADVIL,MOTRIN) 200 MG tablet Take 400 mg by mouth every 6 (six) hours as needed for moderate pain.      Marland Kitchen. loratadine (CLARITIN) 10 MG tablet Take 10 mg by mouth daily as needed for allergies.       . metoprolol tartrate (LOPRESSOR) 25 MG tablet Take 12.5 mg by mouth 2 (two) times daily.      Marland Kitchen. oxyCODONE (ROXICODONE) 5 MG immediate release tablet Take 1 tablet (5 mg total) by mouth every 6 (six) hours as needed for severe pain.  20 tablet  0  . RESTASIS 0.05 % ophthalmic emulsion Place 1 drop into both eyes at bedtime. daily       No current facility-administered medications for this visit.   Allergies  Allergen Reactions  . Statins     Simvastatin (myalgias), Crestor (myalgias), Lipitor (with PCP in  past had myalgias - tolerating this well currently)  . Zetia [Ezetimibe]     Muscle aches    Family History  Problem Relation Age of Onset  . Heart attack Father   . Heart disease Father   . Hypertension Father   . Hyperlipidemia Father

## 2013-11-10 NOTE — Assessment & Plan Note (Signed)
Pt's LDL and LDL-P improved since last visit, likely related to change in lifestyle habits.  He is concerned over taking Lipitor 40mg  over the long run due to history of myalgias when he rides his bike.  He wants to be as aggressive as possible with LDL lowering therapies given he has now had 2 surgical interventions.  Discussed PCSK-9 inhibitors with patient.  He is interested in pursuing this option.  He does have BCBS of [place] which is not covering ASCVD at this time and will not meet the financial cut off for patient assistance.  Will send in application and see if he will qualify for the [facility].  In the interim, will continue current therapy.  Once he starts to ride his bike again, if he has significant myalgias, he will call us to discuss decreasing dose of Lipitor.

## 2013-11-10 NOTE — Patient Instructions (Signed)
Continue current therapy for now.  If you have problems with Lipitor 40mg  daily once you start riding your bike, please let me know.   We will start the paperwork for the PCSK-9 Praluent to see if you qualify.

## 2014-01-08 ENCOUNTER — Encounter (HOSPITAL_COMMUNITY): Payer: Self-pay | Admitting: Cardiovascular Disease

## 2014-03-19 ENCOUNTER — Encounter: Payer: Self-pay | Admitting: Cardiovascular Disease

## 2014-03-19 ENCOUNTER — Ambulatory Visit (INDEPENDENT_AMBULATORY_CARE_PROVIDER_SITE_OTHER): Payer: BLUE CROSS/BLUE SHIELD | Admitting: Cardiovascular Disease

## 2014-03-19 ENCOUNTER — Ambulatory Visit: Payer: Self-pay | Admitting: Cardiovascular Disease

## 2014-03-19 VITALS — BP 110/82 | HR 78 | Ht 71.0 in | Wt 163.8 lb

## 2014-03-19 DIAGNOSIS — E785 Hyperlipidemia, unspecified: Secondary | ICD-10-CM

## 2014-03-19 DIAGNOSIS — Z951 Presence of aortocoronary bypass graft: Secondary | ICD-10-CM

## 2014-03-19 DIAGNOSIS — G458 Other transient cerebral ischemic attacks and related syndromes: Secondary | ICD-10-CM

## 2014-03-19 MED ORDER — METOPROLOL TARTRATE 25 MG PO TABS: 12.5000 mg | ORAL_TABLET | Freq: Every day | ORAL | Status: DC

## 2014-03-19 NOTE — Assessment & Plan Note (Signed)
S/P RCEA Dr. Edilia Boickson has f/u duplex scheduled at his office next month

## 2014-03-19 NOTE — Assessment & Plan Note (Signed)
Stable with no angina and good activity level.  Continue medical Rx  

## 2014-03-19 NOTE — Patient Instructions (Signed)
Your physician wants you to follow-up in:  6 MONTHS WITH DR NISHAN  You will receive a reminder letter in the mail two months in advance. If you don't receive a letter, please call our office to schedule the follow-up appointment. Your physician recommends that you continue on your current medications as directed. Please refer to the Current Medication list given to you today. 
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
## 2014-03-19 NOTE — Assessment & Plan Note (Signed)
Cholesterol is at goal.  Continue current dose of statin and diet Rx.  No myalgias or side effects.  F/U  LFT's in 6 months. Lab Results  Component Value Date   LDLCALC 73 11/03/2013

## 2014-03-26 ENCOUNTER — Other Ambulatory Visit: Payer: Self-pay

## 2014-03-26 MED ORDER — ATORVASTATIN CALCIUM 40 MG PO TABS: 40.0000 mg | ORAL_TABLET | Freq: Every day | ORAL | Status: DC

## 2014-03-26 NOTE — Telephone Encounter (Signed)
Wendall StadePeter C Nishan, MD at 03/19/2014 2:59 PM  atorvastatin (LIPITOR) 40 MG tablet Take 1 tablet (40 mg total) by mouth daily at 6 PM Your physician recommends that you continue on your current medications as directed. Please refer to the Current Medication list given to you today

## 2014-04-15 ENCOUNTER — Other Ambulatory Visit (HOSPITAL_COMMUNITY): Payer: BC Managed Care – PPO

## 2014-04-15 ENCOUNTER — Ambulatory Visit: Payer: BC Managed Care – PPO | Admitting: Vascular Surgery

## 2014-05-05 ENCOUNTER — Encounter: Payer: Self-pay | Admitting: Vascular Surgery

## 2014-05-06 ENCOUNTER — Ambulatory Visit (HOSPITAL_COMMUNITY)
Admission: RE | Admit: 2014-05-06 | Discharge: 2014-05-06 | Disposition: A | Discharge status: Home / Self Care | Payer: BLUE CROSS/BLUE SHIELD | Source: Ambulatory Visit | Attending: Vascular Surgery | Admitting: Vascular Surgery

## 2014-05-06 ENCOUNTER — Ambulatory Visit (INDEPENDENT_AMBULATORY_CARE_PROVIDER_SITE_OTHER): Payer: BLUE CROSS/BLUE SHIELD | Admitting: Vascular Surgery

## 2014-05-06 ENCOUNTER — Encounter: Payer: Self-pay | Admitting: Vascular Surgery

## 2014-05-06 VITALS — BP 131/83 | HR 72 | Ht 71.0 in | Wt 166.0 lb

## 2014-05-06 DIAGNOSIS — I6521 Occlusion and stenosis of right carotid artery: Secondary | ICD-10-CM | POA: Diagnosis not present

## 2014-05-06 DIAGNOSIS — I6523 Occlusion and stenosis of bilateral carotid arteries: Secondary | ICD-10-CM | POA: Diagnosis not present

## 2014-05-06 DIAGNOSIS — Z48812 Encounter for surgical aftercare following surgery on the circulatory system: Secondary | ICD-10-CM

## 2014-05-06 NOTE — Progress Notes (Signed)
[facility]  [facility] PATIENT  Patient name: Andrew Sampson MRN: [id number] DOB: 10/03/1960 Sex: male  REASON FOR VISIT: follow up after right carotid endarterectomy.  HPI: Andrew Sampson is a 54 y.o. male who presented with a right brain TIA and was found to have a 60% right carotid stenosis. By duplex this appeared to be fairly soft plaque and was felt to be the culprit lesion. He underwent a right carotid endarterectomy with bovine pericardial patch angioplasty on 09/30/2013. He returns for 6 month follow up visit.  Once I saw him last, he is done well and has had no focal weakness or paresthesias. He is on aspirin. He is on a statin. We have had a lot of discussion about the importance of nutrition and he has really been pursuing this. He also states very active and [facility].   Past Medical History  Diagnosis Date  . Heart burn   . Hyperlipidemia     statin intolerant  . Familial hyperlipidemia     LDL 212 on 01/12/13  . SEMI (subendocardial myocardial infarction)   . Anxiety   . Atypical chest pain   . GERD (gastroesophageal reflux disease)    Family History  Problem Relation Age of Onset  . Heart attack Father   . Heart disease Father   . Hypertension Father   . Hyperlipidemia Father    SOCIAL HISTORY: History  Substance Use Topics  . Smoking status: Never Smoker   . Smokeless tobacco: Never Used  . Alcohol Use: 0.0 oz/week    0 Standard drinks or equivalent per week     Comment: occasional   Allergies  Allergen Reactions  . Statins     Simvastatin (myalgias), Crestor (myalgias), Lipitor (with PCP in past had myalgias - tolerating this well currently)  . Zetia [Ezetimibe]     Muscle aches   Current Outpatient Prescriptions  Medication Sig Dispense Refill  . acyclovir (ZOVIRAX) 200 MG capsule Take 400 mg by mouth at bedtime.    Marland Kitchen. aspirin EC 325 MG EC tablet Take 1 tablet (325 mg total) by mouth daily.    Marland Kitchen. atorvastatin  (LIPITOR) 40 MG tablet Take 1 tablet (40 mg total) by mouth daily at 6 PM. 90 tablet 3  . clonazePAM (KLONOPIN) 0.5 MG tablet Take 0.5 mg by mouth at bedtime as needed for anxiety.     . Coenzyme Q10 (CO Q-10) 100 MG CAPS Take 400 mg by mouth daily.  0  . esomeprazole (NEXIUM) 40 MG capsule Take 40 mg by mouth daily at 12 noon.    Marland Kitchen. ibuprofen (ADVIL,MOTRIN) 200 MG tablet Take 400 mg by mouth every 6 (six) hours as needed for moderate pain.    Marland Kitchen. loratadine (CLARITIN) 10 MG tablet Take 10 mg by mouth daily as needed for allergies.     . metoprolol tartrate (LOPRESSOR) 25 MG tablet Take 0.5 tablets (12.5 mg total) by mouth daily. 15 tablet 6  . Omega-3 Fatty Acids (OMEGA 3 PO) Take 2 tablets by mouth daily.    . RESTASIS 0.05 % ophthalmic emulsion Place 1 drop into both eyes at bedtime. daily     No current facility-administered medications for this visit.   REVIEW OF SYSTEMS: Arly.Keller[X ] denotes positive finding; [  ] denotes negative finding  CARDIOVASCULAR:  [ ]  chest pain   [ ]  chest pressure   [ ]  palpitations   [ ]  orthopnea   [ ]  dyspnea  on exertion    claudication    rest pain    DVT    phlebitis PULMONARY:    productive cough    asthma    wheezing NEUROLOGIC:    weakness   paresthesias   aphasia   amaurosis   dizziness HEMATOLOGIC:    bleeding problems    clotting disorders MUSCULOSKELETAL:   joint pain    joint swelling  leg swelling GASTROINTESTINAL:   blood in stool    hematemesis GENITOURINARY:    dysuria    hematuria PSYCHIATRIC:   history of major depression INTEGUMENTARY:   rashes   ulcers CONSTITUTIONAL:   fever    chills  PHYSICAL EXAM: Filed Vitals:   05/06/14 1513 05/06/14 1518  BP: 135/84 131/83  Pulse: 72   Height:  (1.803 m)   Weight: 166 lb (75.297 kg)   SpO2: 98%    GENERAL: The patient is a well-nourished male, in no acute distress. The vital signs are documented  above. CARDIOVASCULAR: There is a regular rate and rhythm. I do not detect carotid bruits. PULMONARY: There is good air exchange bilaterally without wheezing or rales. ABDOMEN: Soft and non-tender with normal pitched bowel sounds.  MUSCULOSKELETAL: There are no major deformities or cyanosis. NEUROLOGIC: No focal weakness or paresthesias are detected. SKIN: There are no ulcers or rashes noted. PSYCHIATRIC: The patient has a normal affect.  DATA:  I have independently interpreted his carotid duplex scan. The right carotid endarterectomy site is widely patent without evidence of restenosis. He has a less than 40% left internal carotid artery stenosis. Vertebral arteries are patent with antegrade flow.  MEDICAL ISSUES: RIGHT CAROTID STENOSIS: The patient is doing well status post right carotid endarterectomy. There is no evidence of recurrent stenosis. He has no significant stenosis on the left. He is on aspirin. He is on a statin. He is very active and continues to [facility] bike. He is very interested nutrition and eats very well. I've order to follow up carotid duplex scan in 1 year and I will see him back at that time. He knows to call sooner if he has problems.   Return in about 1 year (around 05/06/2015).   Kecia Swoboda S [facility] [place] Beeper: [phone]

## 2014-05-06 NOTE — Addendum Note (Signed)
Addended by: Sharee PimpleMCCHESNEY, MARILYN K on: 05/06/2014 04:03 PM   Modules accepted: Orders

## 2014-07-15 ENCOUNTER — Telehealth: Payer: Self-pay | Admitting: *Deleted
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
## 2014-07-15 NOTE — Telephone Encounter (Signed)
Can take as needed

## 2014-07-15 NOTE — Telephone Encounter (Signed)
-----   Message from [name], MD sent at 07/09/2014  9:56 AM EDT ----- Make sure he's had recent labs for cholesterol and lft's ----- Message -----    From: SYSTEM    Sent: 07/09/2014  12:05 AM      To: [name], MD

## 2014-07-15 NOTE — Telephone Encounter (Signed)
F/U ° ° ° ° ° ° °Pt returning Christine's phone call. °

## 2014-07-15 NOTE — Telephone Encounter (Signed)
LM TO CALL BACK ./CY 

## 2014-07-20 NOTE — Telephone Encounter (Signed)
LEFT  VOICE MAIL THAT MAY TAKE MED  ON A AS  NEEDED BASIS.PER  DR Eden Emms Zack Seal

## 2014-08-11 ENCOUNTER — Encounter: Payer: Self-pay | Admitting: Cardiovascular Disease

## 2014-09-21 ENCOUNTER — Ambulatory Visit (INDEPENDENT_AMBULATORY_CARE_PROVIDER_SITE_OTHER): Payer: BLUE CROSS/BLUE SHIELD | Admitting: Cardiovascular Disease

## 2014-09-21 ENCOUNTER — Encounter: Payer: Self-pay | Admitting: Cardiovascular Disease

## 2014-09-21 VITALS — BP 120/90 | HR 62 | Ht 71.0 in | Wt 160.8 lb

## 2014-09-21 DIAGNOSIS — R55 Syncope and collapse: Secondary | ICD-10-CM | POA: Diagnosis not present

## 2014-09-21 DIAGNOSIS — I2510 Atherosclerotic heart disease of native coronary artery without angina pectoris: Secondary | ICD-10-CM | POA: Diagnosis not present

## 2014-09-21 NOTE — Patient Instructions (Signed)
Medication Instructions:  NO CHANGES  Labwork: NONE  Testing/Procedures: Your physician has requested that you have an exercise tolerance test. For further information please visit [email or web address] Please also follow instruction sheet, as given.    Follow-Up: Your physician wants you to follow-up in: 6 MONTHS  WITH  DR Haywood Filler will receive a reminder letter in the mail two months in advance. If you don't receive a letter, please call our office to schedule the follow-up appointment.  You have been referred to [facility]  IN LIPID  CLINIC  FOR  POSSIBLE  PRALUENT  THERAPY   Any Other Special Instructions Will Be Listed Below (If Applicable).
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
## 2014-10-22 ENCOUNTER — Telehealth: Payer: Self-pay | Admitting: Cardiovascular Disease

## 2014-10-22 NOTE — Telephone Encounter (Signed)
Does not need ETT if feeling better

## 2014-10-22 NOTE — Telephone Encounter (Signed)
New Message  Pt calling to speak w/ RN- pt stated he is not having same symptoms as before and does not think he needs stress test (sched for 9/26). Pt wanted to confirm if this was okay to cancel w/ Dr Eden Emms. Please call back and discuss.

## 2014-10-22 NOTE — Telephone Encounter (Signed)
Called and left message on patient's cell number.

## 2014-10-22 NOTE — Telephone Encounter (Signed)
Patient wants to know if he really needs to have an exercise stress test on Monday, since he is feeling better. Patient will do the test if Dr. Eden Emms thinks he should. Will forward to Dr. Eden Emms for advisement.   It is okay to leave voicemail on cell phone tomorrow or call patient today at [phone].

## 2014-10-22 NOTE — Telephone Encounter (Signed)
Called patient and left message to call back.

## 2014-10-23 NOTE — Telephone Encounter (Signed)
Left message for patient to call back about appointment on Monday.

## 2014-10-26 ENCOUNTER — Encounter: Payer: BLUE CROSS/BLUE SHIELD | Admitting: Cardiology

## 2014-10-26 ENCOUNTER — Ambulatory Visit: Payer: BLUE CROSS/BLUE SHIELD | Admitting: Pharmacist
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
## 2015-03-26 ENCOUNTER — Encounter: Payer: Self-pay | Admitting: Cardiovascular Disease

## 2015-03-26 ENCOUNTER — Ambulatory Visit (INDEPENDENT_AMBULATORY_CARE_PROVIDER_SITE_OTHER): Payer: BLUE CROSS/BLUE SHIELD | Admitting: Cardiovascular Disease

## 2015-03-26 VITALS — BP 120/80 | HR 73 | Ht 71.0 in | Wt 160.8 lb

## 2015-03-26 DIAGNOSIS — I6523 Occlusion and stenosis of bilateral carotid arteries: Secondary | ICD-10-CM | POA: Diagnosis not present

## 2015-03-26 MED ORDER — ALIROCUMAB 75 MG/ML ~~LOC~~ SOPN
75.0000 mg | PEN_INJECTOR | SUBCUTANEOUS | Status: DC
Start: 2015-03-26 — End: 2016-03-06

## 2015-03-26 MED ORDER — ATORVASTATIN CALCIUM 10 MG PO TABS: 10.0000 mg | ORAL_TABLET | Freq: Every day | ORAL | Status: DC

## 2015-03-26 NOTE — Patient Instructions (Signed)

## 2015-04-14 ENCOUNTER — Encounter: Payer: Self-pay | Admitting: Cardiovascular Disease

## 2015-05-12 ENCOUNTER — Ambulatory Visit: Payer: BLUE CROSS/BLUE SHIELD | Admitting: Vascular Surgery

## 2015-05-12 ENCOUNTER — Encounter (HOSPITAL_COMMUNITY): Payer: BLUE CROSS/BLUE SHIELD

## 2015-05-12 ENCOUNTER — Encounter: Payer: Self-pay | Admitting: Vascular Surgery

## 2015-05-19 ENCOUNTER — Encounter: Payer: Self-pay | Admitting: Vascular Surgery

## 2015-05-19 ENCOUNTER — Ambulatory Visit (HOSPITAL_COMMUNITY)
Admission: RE | Admit: 2015-05-19 | Discharge: 2015-05-19 | Disposition: A | Discharge status: Home / Self Care | Payer: BLUE CROSS/BLUE SHIELD | Source: Ambulatory Visit | Attending: Vascular Surgery | Admitting: Vascular Surgery

## 2015-05-19 ENCOUNTER — Other Ambulatory Visit: Payer: Self-pay | Admitting: Vascular Surgery

## 2015-05-19 ENCOUNTER — Ambulatory Visit (INDEPENDENT_AMBULATORY_CARE_PROVIDER_SITE_OTHER): Payer: BLUE CROSS/BLUE SHIELD | Admitting: Vascular Surgery

## 2015-05-19 VITALS — BP 131/89 | HR 57 | Temp 96.7°F | Resp 16 | Ht 71.0 in | Wt 161.0 lb

## 2015-05-19 DIAGNOSIS — K219 Gastro-esophageal reflux disease without esophagitis: Secondary | ICD-10-CM | POA: Insufficient documentation

## 2015-05-19 DIAGNOSIS — I779 Disorder of arteries and arterioles, unspecified: Secondary | ICD-10-CM | POA: Insufficient documentation

## 2015-05-19 DIAGNOSIS — Z48812 Encounter for surgical aftercare following surgery on the circulatory system: Secondary | ICD-10-CM | POA: Diagnosis not present

## 2015-05-19 DIAGNOSIS — I6521 Occlusion and stenosis of right carotid artery: Secondary | ICD-10-CM

## 2015-05-19 DIAGNOSIS — F419 Anxiety disorder, unspecified: Secondary | ICD-10-CM | POA: Diagnosis not present

## 2015-05-19 DIAGNOSIS — E785 Hyperlipidemia, unspecified: Secondary | ICD-10-CM | POA: Insufficient documentation

## 2015-05-19 NOTE — Progress Notes (Signed)
Vascular and Vein Specialist of   Patient name: Andrew Sampson MRN: [id number] DOB: 04/16/1960 Sex: male  REASON FOR VISIT: follow up after previous right carotid endarterectomy  HPI: Andrew Sampson is a 55 y.o. male who underwent a right carotid endarterectomy in September 2015. He comes in for a 1 year follow up visit. Since I saw him last, he denies any history of stroke, TIAs, expressive or receptive aphasia, or amaurosis fugax.  He does complain of a headache in the back of his neck when he sleeps on his right side but has a history of cervical disc disease.  He is on aspirin and is on a statin. He eats well and exercises quite a bit.  Past Medical History  Diagnosis Date  . Heart burn   . Hyperlipidemia     statin intolerant  . Familial hyperlipidemia     LDL 212 on 01/12/13  . SEMI (subendocardial myocardial infarction) (HCC)   . Anxiety   . Atypical chest pain   . GERD (gastroesophageal reflux disease)     Family History  Problem Relation Age of Onset  . Heart attack Father   . Heart disease Father   . Hypertension Father   . Hyperlipidemia Father     SOCIAL HISTORY: Social History  Substance Use Topics  . Smoking status: Never Smoker   . Smokeless tobacco: Never Used  . Alcohol Use: 0.0 oz/week    0 Standard drinks or equivalent per week     Comment: occasional    Allergies  Allergen Reactions  . Statins     Simvastatin (myalgias), Crestor (myalgias), Lipitor (with PCP in past had myalgias - tolerating this well currently)  . Zetia [Ezetimibe]     Muscle aches    Current Outpatient Prescriptions  Medication Sig Dispense Refill  . acyclovir (ZOVIRAX) 200 MG capsule Take 400 mg by mouth at bedtime.    . Alirocumab (PRALUENT) 75 MG/ML SOPN Inject 75 mg into the skin every 14 (fourteen) days. 2 pen 11  . aspirin EC 325 MG EC tablet Take 1 tablet (325 mg total) by mouth daily.    Marland Kitchen atorvastatin (LIPITOR) 10 MG tablet Take 1 tablet (10 mg total) by  mouth daily. 90 tablet 3  . clonazePAM (KLONOPIN) 0.5 MG tablet Take 0.5 mg by mouth at bedtime as needed for anxiety.     . Coenzyme Q10 200 MG capsule Take 200 mg by mouth daily.     Marland Kitchen esomeprazole (NEXIUM) 40 MG capsule Take 40 mg by mouth daily at 12 noon.    Marland Kitchen ibuprofen (ADVIL,MOTRIN) 200 MG tablet Take 400 mg by mouth every 6 (six) hours as needed for moderate pain.    Marland Kitchen loratadine (CLARITIN) 10 MG tablet Take 10 mg by mouth daily as needed for allergies.     . Omega-3 Fatty Acids (OMEGA 3 PO) Take 2 tablets by mouth daily.    . RESTASIS 0.05 % ophthalmic emulsion Place 1 drop into both eyes at bedtime.      No current facility-administered medications for this visit.    REVIEW OF SYSTEMS:   denotes positive finding,  denotes negative finding Cardiac  Comments:  Chest pain or chest pressure:    Shortness of breath upon exertion:    Short of breath when lying flat:    Irregular heart rhythm:        Vascular    Pain in calf, thigh, or hip brought on by ambulation:  Pain in feet at night that wakes you up from your sleep:     Blood clot in your veins:    Leg swelling:         Pulmonary    Oxygen at home:    Productive cough:     Wheezing:         Neurologic    Sudden weakness in arms or legs:     Sudden numbness in arms or legs:     Sudden onset of difficulty speaking or slurred speech:    Temporary loss of vision in one eye:     Problems with dizziness:         Gastrointestinal    Blood in stool:     Vomited blood:         Genitourinary    Burning when urinating:     Blood in urine:        Psychiatric    Major depression:         Hematologic    Bleeding problems:    Problems with blood clotting too easily:        Skin    Rashes or ulcers:        Constitutional    Fever or chills:      PHYSICAL EXAM: Filed Vitals:   05/19/15 1525 05/19/15 1532  BP: 121/79 131/89  Pulse: 56 57  Temp: 96.7 F (35.9 C)   Resp: 16   Height: 5\' 11"  (1.803 m)     Weight: 161 lb (73.029 kg)   SpO2: 100%     GENERAL: The patient is a well-nourished male, in no acute distress. The vital signs are documented above. CARDIAC: There is a regular rate and rhythm.  VASCULAR: I do not detect carotid bruits. PULMONARY: There is good air exchange bilaterally without wheezing or rales. ABDOMEN: Soft and non-tender with normal pitched bowel sounds.  MUSCULOSKELETAL: There are no major deformities or cyanosis. NEUROLOGIC: No focal weakness or paresthesias are detected. SKIN: There are no ulcers or rashes noted. PSYCHIATRIC: The patient has a normal affect.  DATA:   CAROTID DUPLEX: having apparently interpreted his carotid duplex scan today. His right carotid endarterectomy site is widely patent without evidence of restenosis. He has a less than 30% left carotid stenosis. Both vertebral arteries are patent with antegrade flow.  MEDICAL ISSUES:  STATUS POST RIGHT CAROTID ENDARTERECTOMY: patient is doing well status post right carotid endarterectomy in 2015. He comes inferiorly follow up visit and has no problems. He has no evidence of recurrent stenosis on the right. He has no significant left carotid stenosis. I ordered a follow up carotid duplex scan in 1 year to see him back at that time. He knows to call sooner if he has problems. He is on aspirin and is on a statin.  Waverly Ferrariickson, Joseguadalupe Stan [facility] [place] Beeper: [phone]

## 2015-05-22 DIAGNOSIS — H5213 Myopia, bilateral: Secondary | ICD-10-CM | POA: Diagnosis not present

## 2015-06-25 ENCOUNTER — Other Ambulatory Visit: Payer: Self-pay | Admitting: *Deleted

## 2015-06-25 DIAGNOSIS — I6523 Occlusion and stenosis of bilateral carotid arteries: Secondary | ICD-10-CM

## 2015-07-12 ENCOUNTER — Encounter: Payer: Self-pay | Admitting: Cardiology

## 2015-09-01 ENCOUNTER — Encounter: Payer: Self-pay | Admitting: Cardiovascular Disease
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
## 2015-09-17 ENCOUNTER — Encounter (INDEPENDENT_AMBULATORY_CARE_PROVIDER_SITE_OTHER): Payer: Self-pay

## 2015-09-17 ENCOUNTER — Ambulatory Visit (INDEPENDENT_AMBULATORY_CARE_PROVIDER_SITE_OTHER): Payer: BLUE CROSS/BLUE SHIELD | Admitting: Cardiovascular Disease

## 2015-09-17 ENCOUNTER — Encounter: Payer: Self-pay | Admitting: Cardiovascular Disease

## 2015-09-17 VITALS — BP 118/86 | HR 65 | Ht 71.0 in | Wt 160.4 lb

## 2015-09-17 DIAGNOSIS — Z79899 Other long term (current) drug therapy: Secondary | ICD-10-CM

## 2015-09-17 LAB — HEPATIC FUNCTION PANEL
ALT: 22 U/L (ref 9–46)
AST: 19 U/L (ref 10–35)
Albumin: 4.3 g/dL (ref 3.6–5.1)
Alkaline Phosphatase: 44 U/L (ref 40–115)
BILIRUBIN DIRECT: 0.1 mg/dL (ref <=0.2)
BILIRUBIN INDIRECT: 0.4 mg/dL (ref 0.2–1.2)
Total Bilirubin: 0.5 mg/dL (ref 0.2–1.2)
Total Protein: 6.8 g/dL (ref 6.1–8.1)

## 2015-09-17 LAB — LIPID PANEL
CHOLESTEROL: 137 mg/dL (ref 125–200)
HDL: 21 mg/dL — ABNORMAL LOW (ref >=40)
LDL Cholesterol: 94 mg/dL (ref <130)
Total CHOL/HDL Ratio: 6.5 Ratio — ABNORMAL HIGH (ref <=5.0)
Triglycerides: 110 mg/dL (ref <150)
VLDL: 22 mg/dL (ref <30)

## 2015-09-17 NOTE — Patient Instructions (Addendum)
Medication Instructions:  Your physician recommends that you continue on your current medications as directed. Please refer to the Current Medication list given to you today.  Labwork: Your physician recommends that you have lab work today- Lipid and liver panel.  Testing/Procedures: NONE  Follow-Up: Your physician wants you to follow-up in: 6 months with Dr. Eden EmmsNishan. You will receive a reminder letter in the mail two months in advance. If you don't receive a letter, please call our office to schedule the follow-up appointment.   If you need a refill on your cardiac medications before your next appointment, please call your pharmacy.

## 2015-09-23 DIAGNOSIS — H6123 Impacted cerumen, bilateral: Secondary | ICD-10-CM | POA: Diagnosis not present

## 2015-10-03 IMAGING — CT CT HEAD W/O CM
1 series · 16 of 30 positions shown, 20 images · non-contrast
Comparison: None

CLINICAL DATA: LEFT facial weakness and numbness which began
earlier today, altered mental status

EXAM:
CT HEAD WITHOUT CONTRAST
TECHNIQUE: Contiguous axial images were obtained from the base of the skull
through the vertex without intravenous contrast.

[Series 2: head 5.0 h30s · axial · 0.45mm/px · z∈[-172,-37]mm · 16 of 30 slices shown, 20 images]
[im 2/30  brain]
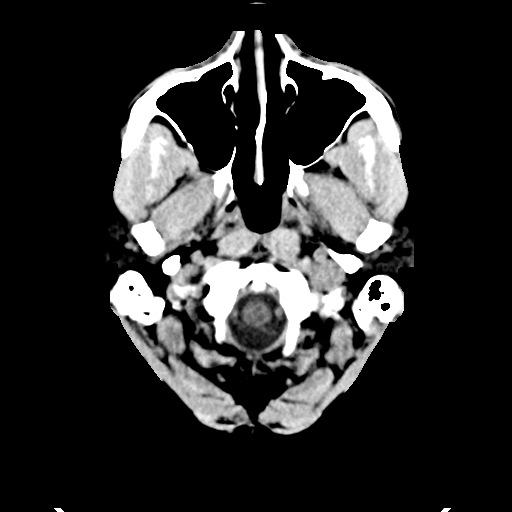
[im 2/30  bone]
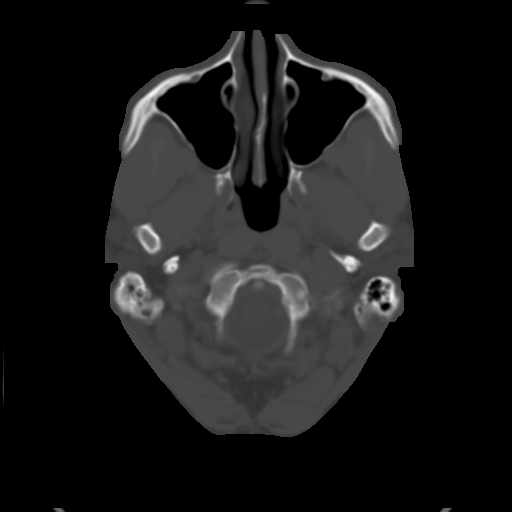
[im 4/30  brain]
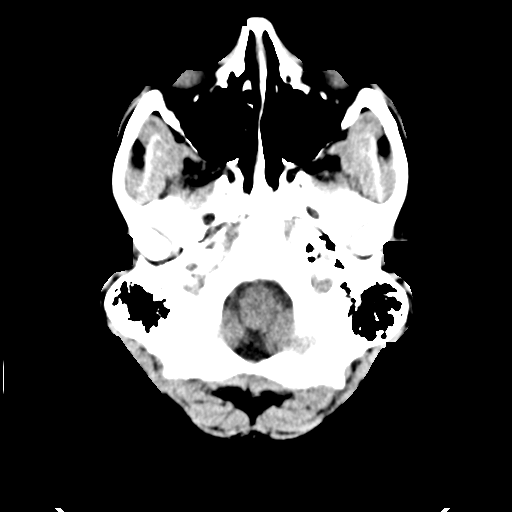
[im 6/30  brain]
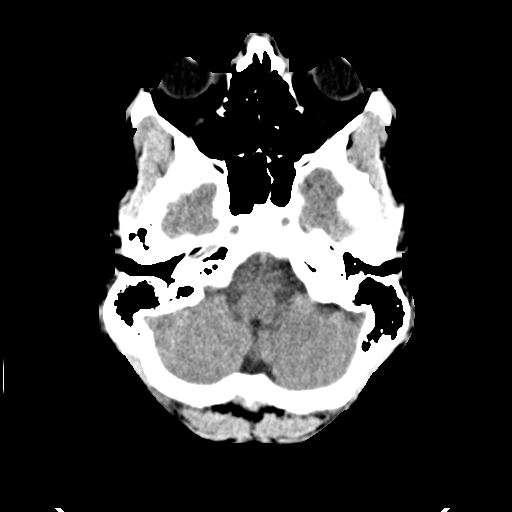
[im 8/30  brain]
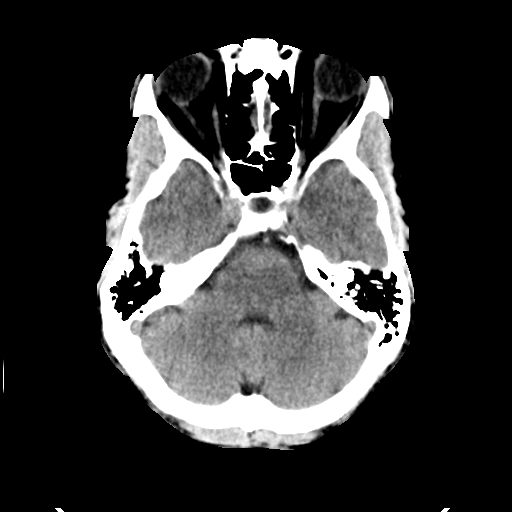
[im 9/30  brain]
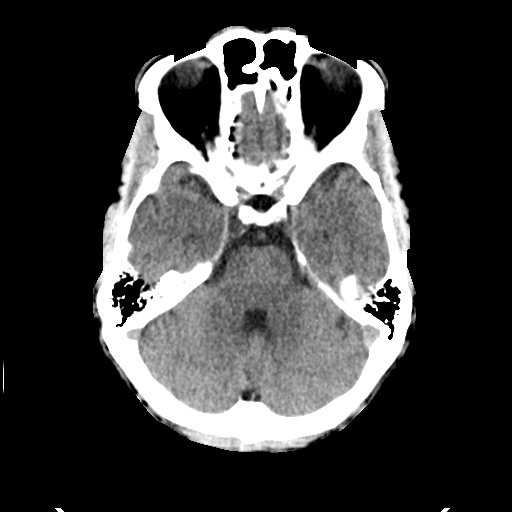
[im 9/30  bone]
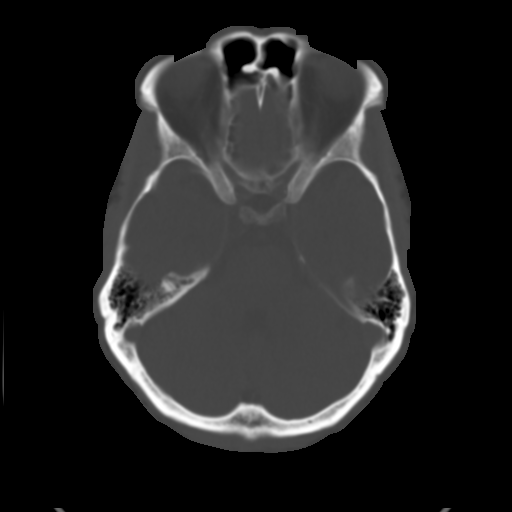
[im 11/30  brain]
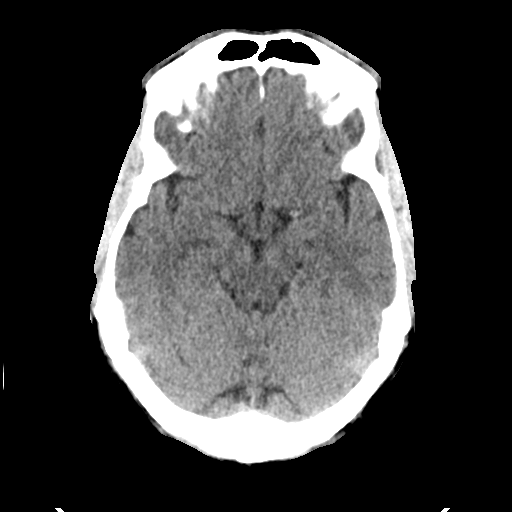
[im 13/30  brain]
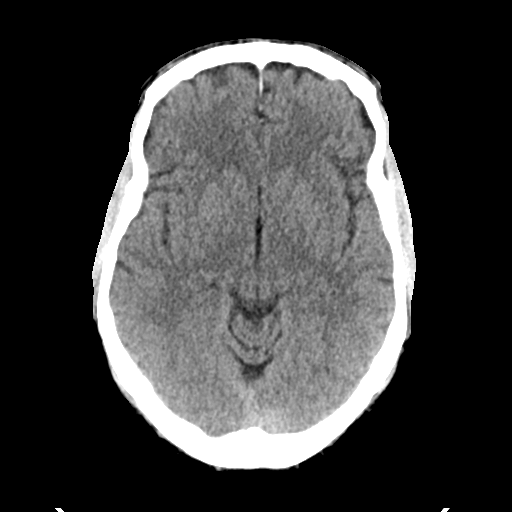
[im 15/30  brain]
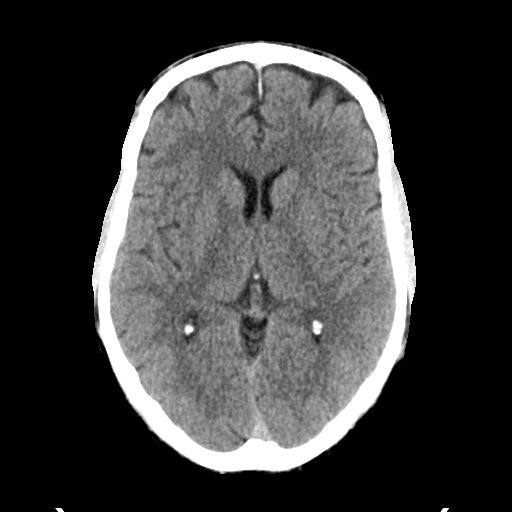
[im 16/30  brain]
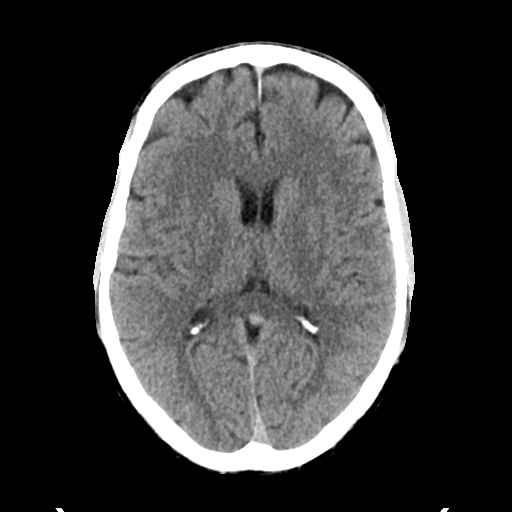
[im 16/30  bone]
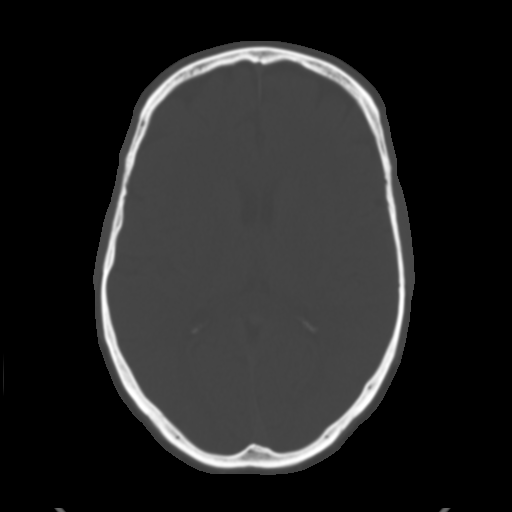
[im 18/30  brain]
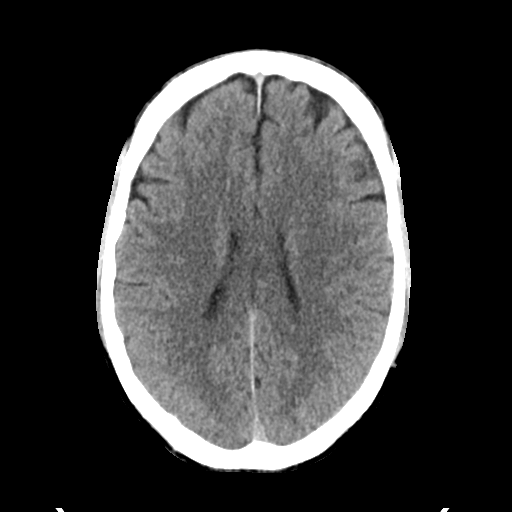
[im 20/30  brain]
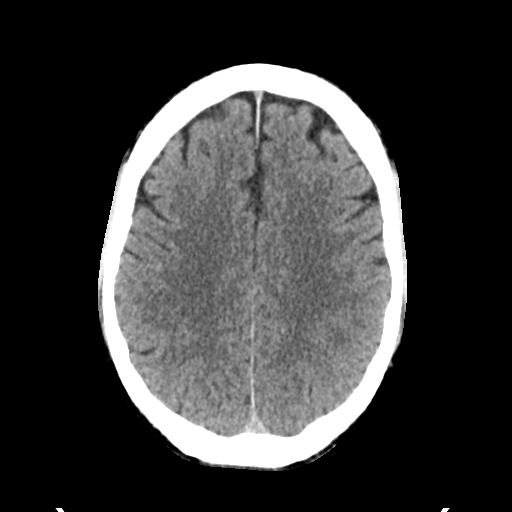
[im 22/30  brain]
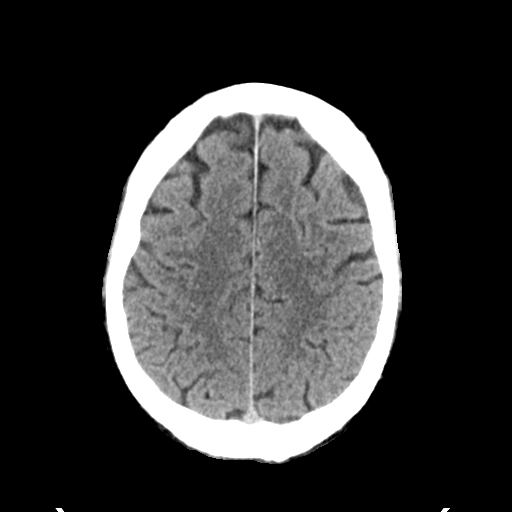
[im 23/30  brain]
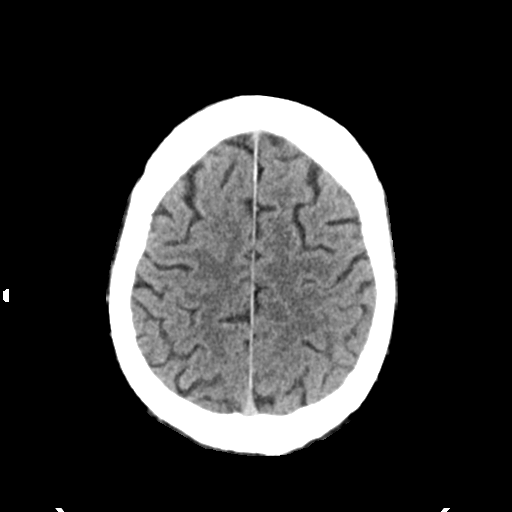
[im 23/30  bone]
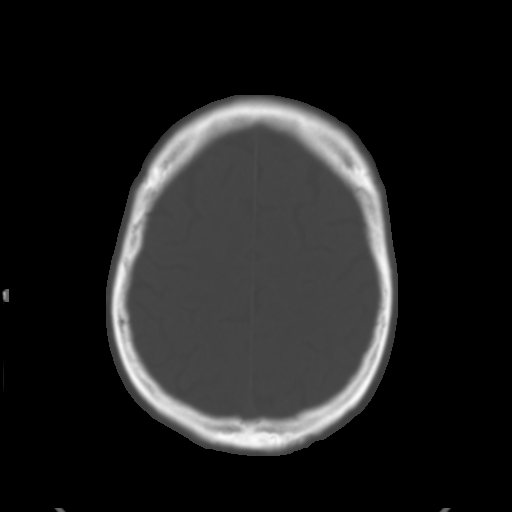
[im 25/30  brain]
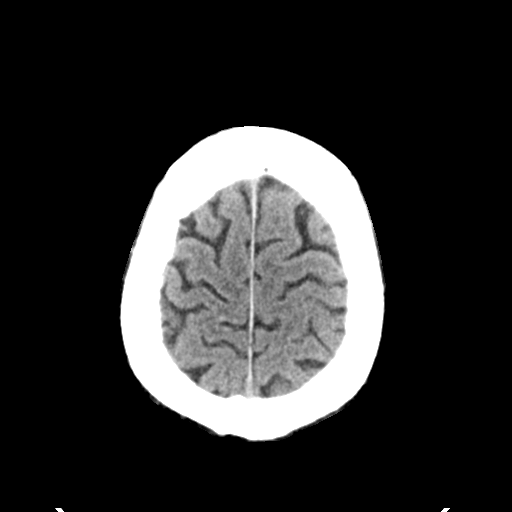
[im 27/30  brain]
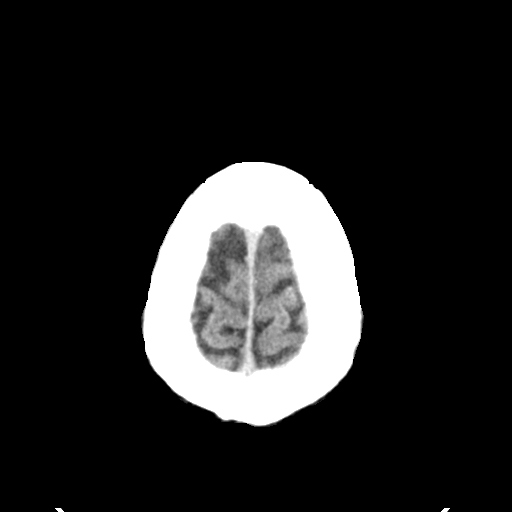
[im 29/30  brain]
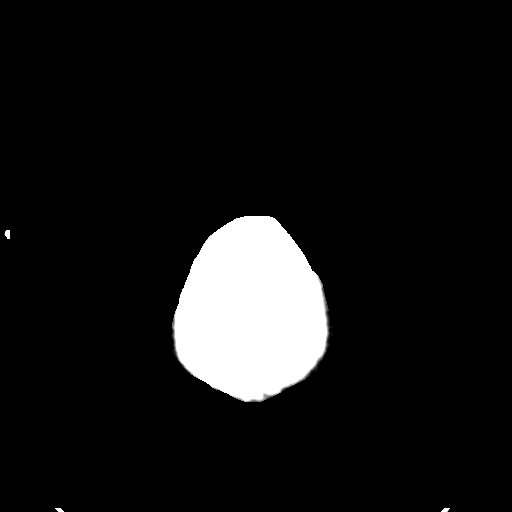

[16 of 30 positions shown; findings below may reference images not displayed]

FINDINGS: Normal ventricular morphology.

No midline shift or mass effect.

Normal appearance of brain parenchyma.

No intracranial hemorrhage, mass lesion, or acute infarction.

Visualized paranasal sinuses and mastoid air cells clear.

Bones unremarkable.
IMPRESSION: No acute intracranial abnormalities.

## 2015-10-03 IMAGING — CR DG CERVICAL SPINE COMPLETE 4+V
7 series · 7 of 7 positions shown · non-contrast
Comparison: Cervical spine MRI 09/24/2009.

CLINICAL DATA: Neck pain. Acute mental status changes. Unspecified
cervical spine surgery in 7066.

EXAM:
CERVICAL SPINE  4+ VIEWS

[w c-spine lat]
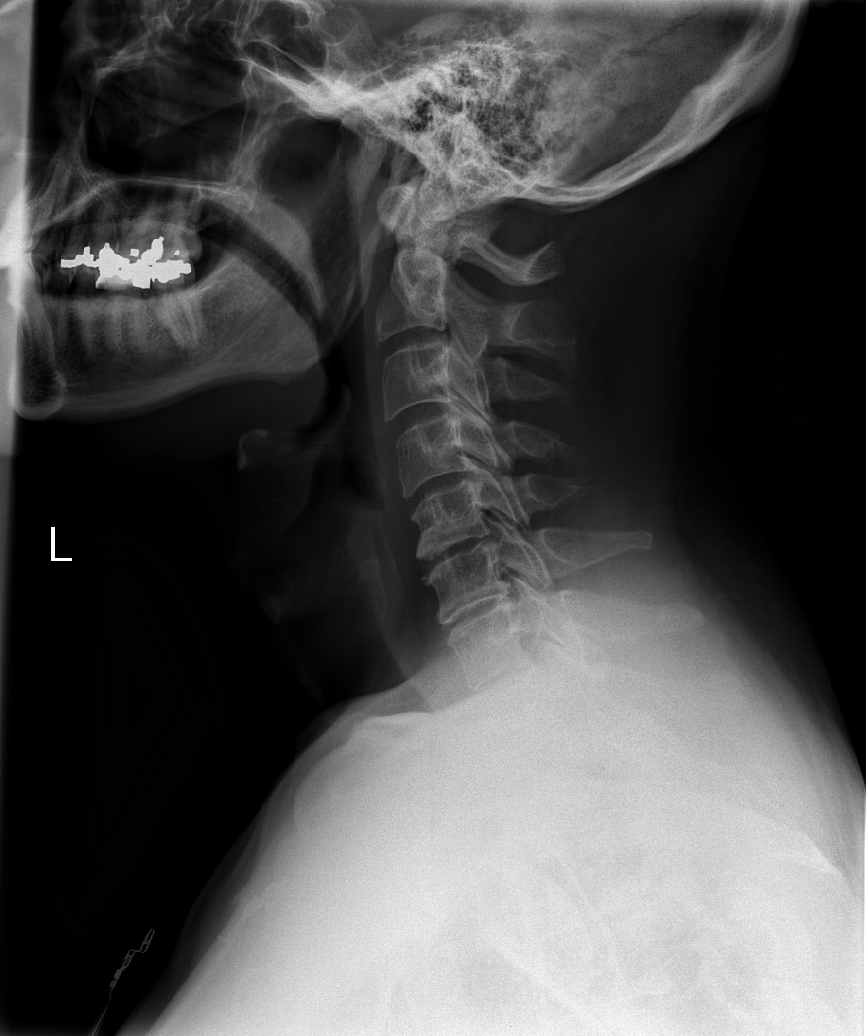

[w c-spine oblique (1 of 2)]
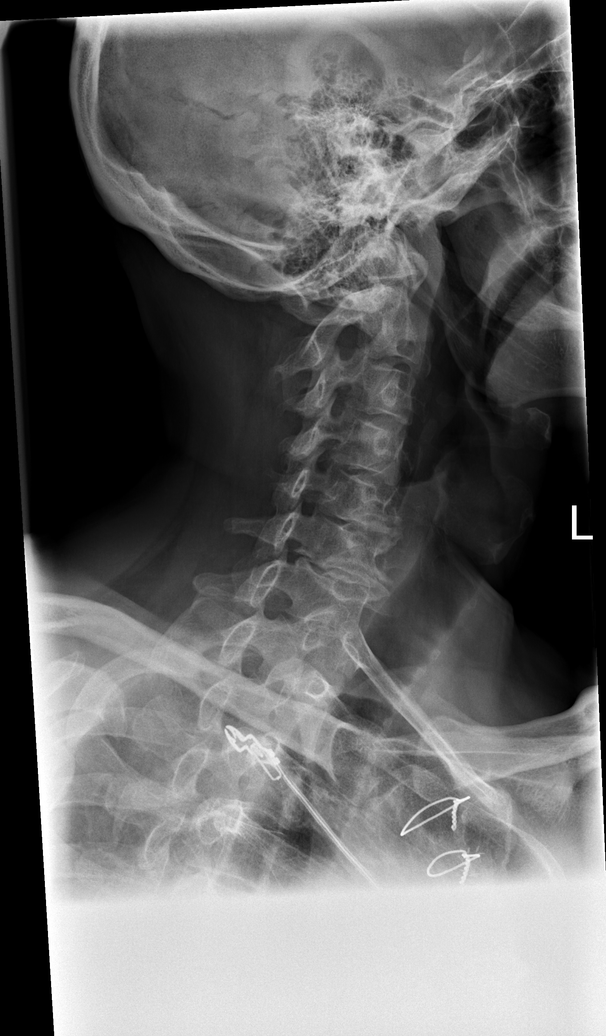

[w c-spine oblique (2 of 2)]
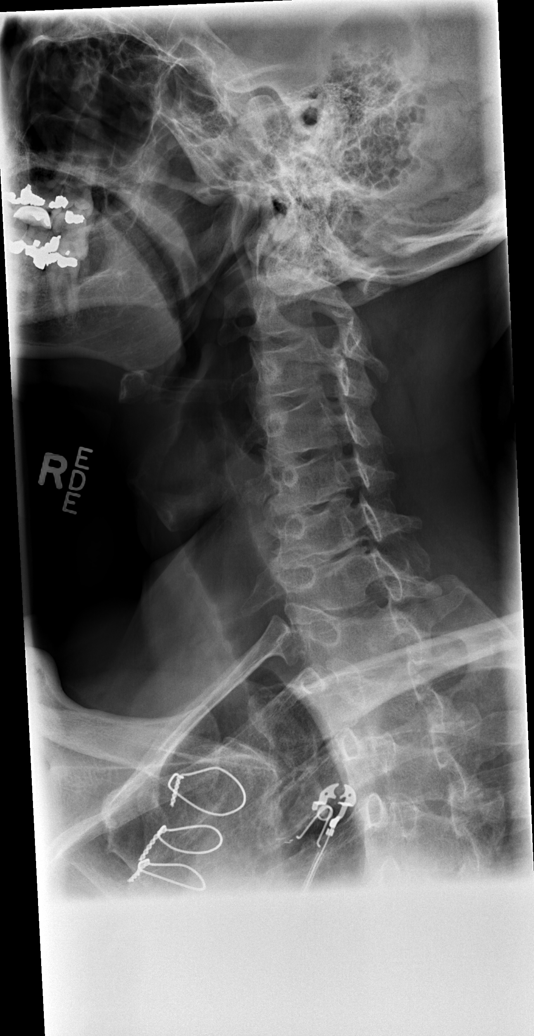

[w c-spine a.p.]
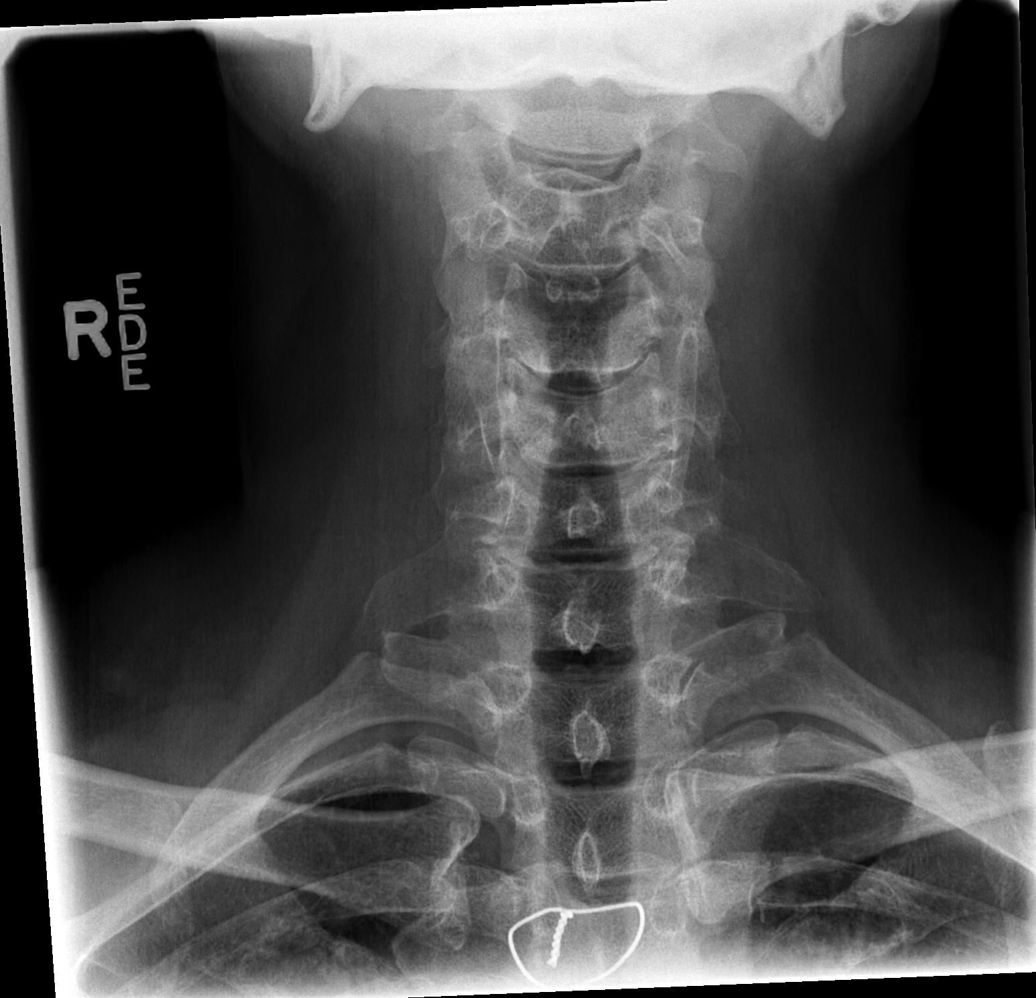

[w c-spine odontoid (1 of 2)]
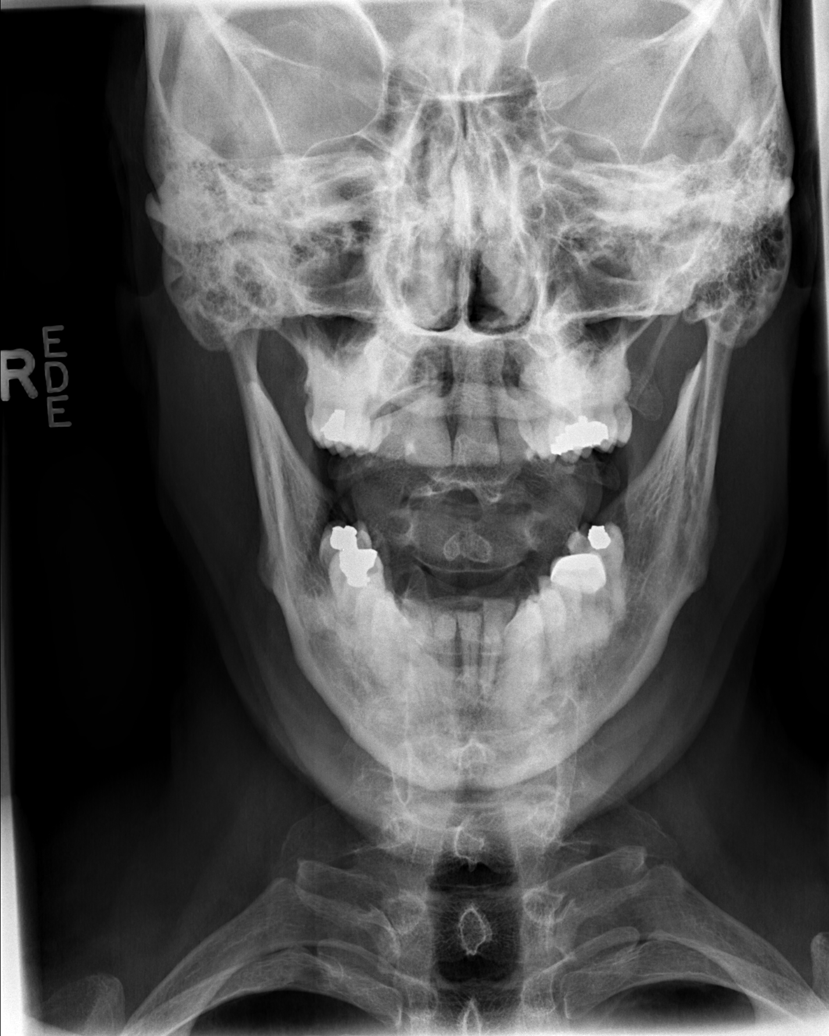

[w c-spine odontoid (2 of 2)]
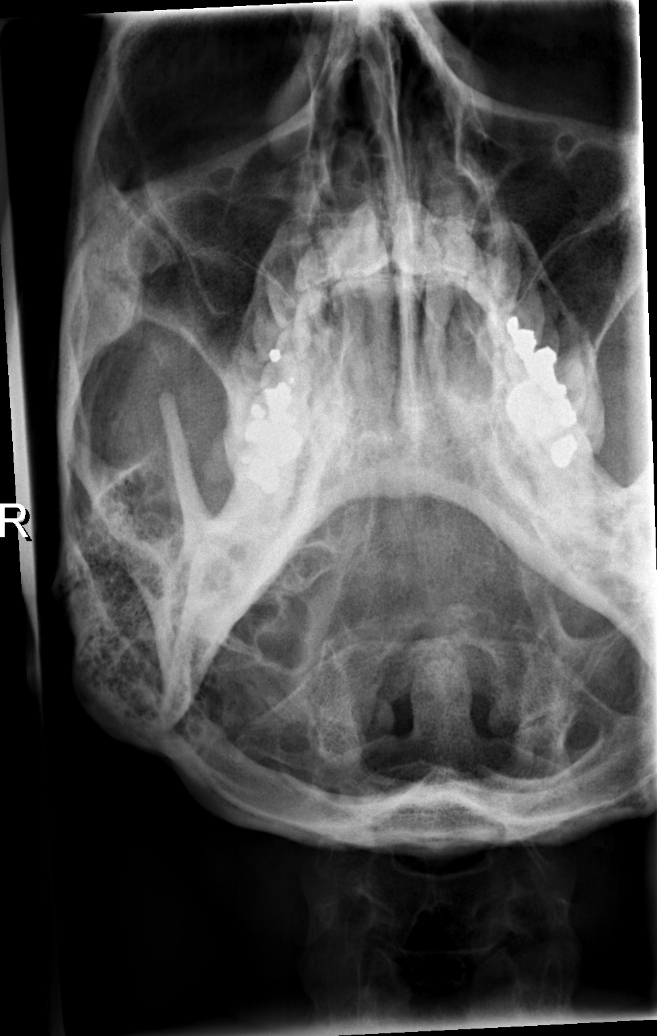

[w swimmers view]
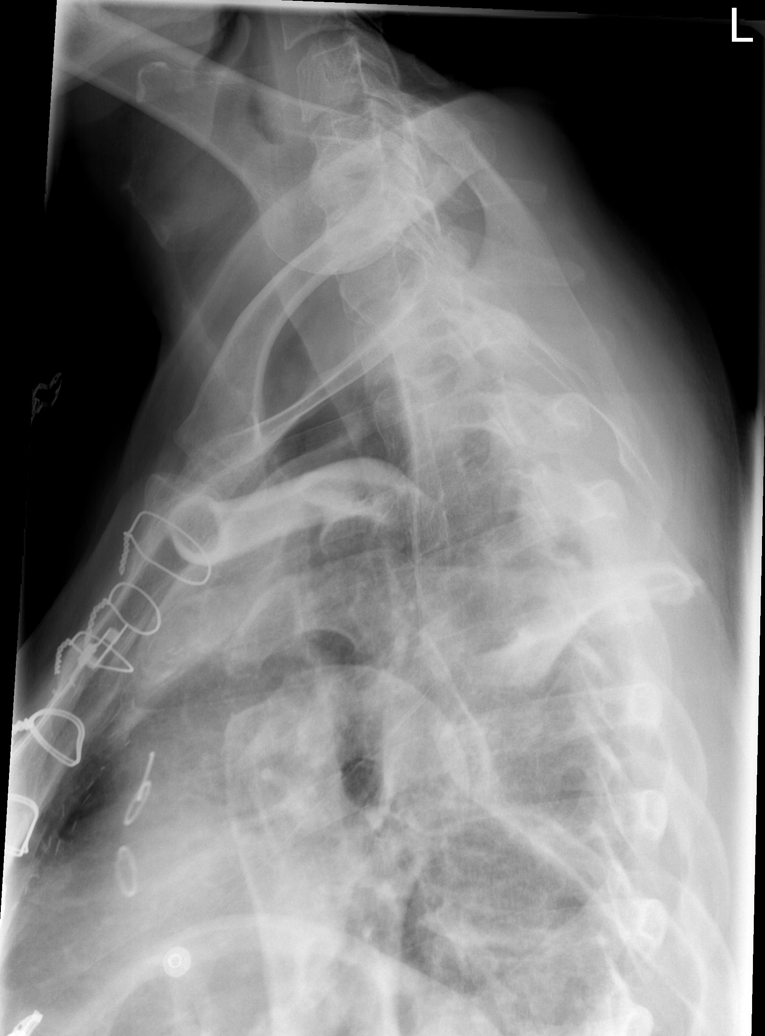

[7 of 7 positions shown; findings below may reference images not displayed]

FINDINGS: Anatomic alignment. Straightening of the usual cervical lordosis. No
visible fractures. Moderate disc space narrowing and endplate
hypertrophic changes at C5-6 and C6-7. Mild disc space narrowing and
endplate hypertrophic changes at C4-5. Normal prevertebral soft
tissues. Facet joints intact. Oblique views demonstrate bilateral
bony foraminal stenoses at C4-5, C5-6 and C6-7. No static evidence
of instability. Note made of very small bilateral cervical ribs.
IMPRESSION: 1. No acute or subacute osseous abnormality.
2. Degenerative disc disease and spondylosis at C5-6 (moderate),
C6-7 (moderate) and C4-5 (mild).
3. Bilateral bony foraminal stenoses at C4-5, C5-6 and C6-7.

## 2015-10-08 ENCOUNTER — Ambulatory Visit (INDEPENDENT_AMBULATORY_CARE_PROVIDER_SITE_OTHER): Payer: BLUE CROSS/BLUE SHIELD

## 2015-10-08 DIAGNOSIS — I2510 Atherosclerotic heart disease of native coronary artery without angina pectoris: Secondary | ICD-10-CM

## 2015-10-08 DIAGNOSIS — R55 Syncope and collapse: Secondary | ICD-10-CM | POA: Diagnosis not present

## 2015-10-08 LAB — EXERCISE TOLERANCE TEST
CHL CUP STRESS STAGE 1 GRADE: 0 %
CHL CUP STRESS STAGE 10 GRADE: 0 %
CHL CUP STRESS STAGE 10 HR: 76 {beats}/min
CHL CUP STRESS STAGE 2 GRADE: 0 %
CHL CUP STRESS STAGE 2 SPEED: 0 mph
CHL CUP STRESS STAGE 5 GRADE: 10 %
CHL CUP STRESS STAGE 5 SPEED: 1.7 mph
CHL CUP STRESS STAGE 6 DBP: 90 mmHg
CHL CUP STRESS STAGE 6 GRADE: 12 %
CHL CUP STRESS STAGE 6 HR: 110 {beats}/min
CHL CUP STRESS STAGE 6 SBP: 176 mmHg
CHL CUP STRESS STAGE 6 SPEED: 2.5 mph
CHL CUP STRESS STAGE 7 DBP: 110 mmHg
CHL CUP STRESS STAGE 7 HR: 117 {beats}/min
CHL CUP STRESS STAGE 7 SBP: 184 mmHg
CHL CUP STRESS STAGE 8 DBP: 111 mmHg
CHL CUP STRESS STAGE 8 SBP: 186 mmHg
CHL CUP STRESS STAGE 9 HR: 116 {beats}/min
CHL RATE OF PERCEIVED EXERTION: 15
CSEPED: 11 min
CSEPEDS: 34 s
CSEPHR: 91 %
CSEPPBP: 186 mmHg
CSEPPMHR: 91 %
Estimated workload: 13.4 METS
MPHR: 164 {beats}/min
Peak HR: 150 {beats}/min
Rest HR: 64 {beats}/min
Stage 1 DBP: 84 mmHg
Stage 1 HR: 87 {beats}/min
Stage 1 SBP: 132 mmHg
Stage 1 Speed: 0 mph
Stage 10 DBP: 85 mmHg
Stage 10 SBP: 145 mmHg
Stage 10 Speed: 0 mph
Stage 2 HR: 86 {beats}/min
Stage 3 Grade: 0 %
Stage 3 HR: 91 {beats}/min
Stage 3 Speed: 1 mph
Stage 4 Grade: 0 %
Stage 4 HR: 91 {beats}/min
Stage 4 Speed: 1 mph
Stage 5 DBP: 90 mmHg
Stage 5 HR: 97 {beats}/min
Stage 5 SBP: 165 mmHg
Stage 7 Grade: 14 %
Stage 7 Speed: 3.4 mph
Stage 8 Grade: 16 %
Stage 8 HR: 150 {beats}/min
Stage 8 Speed: 4.2 mph
Stage 9 DBP: 105 mmHg
Stage 9 Grade: 0 %
Stage 9 SBP: 172 mmHg
Stage 9 Speed: 1.5 mph

## 2015-10-21 ENCOUNTER — Telehealth: Payer: Self-pay | Admitting: Cardiovascular Disease

## 2015-10-21 NOTE — Telephone Encounter (Signed)
New message  ° ° °Pt verbalized that he is returning call for rn °

## 2015-10-21 NOTE — Telephone Encounter (Signed)
Called patient with ETT results. Per Dr. Eden EmmsNishan, Start taking BP at home 3-4 x/ week f/u with me in 3 months ETT normal except high BP response to exercise May need BP med. Patient verbalized understanding and will follow-up in December.
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
## 2016-01-14 ENCOUNTER — Encounter: Payer: Self-pay | Admitting: Cardiovascular Disease

## 2016-01-14 ENCOUNTER — Ambulatory Visit (INDEPENDENT_AMBULATORY_CARE_PROVIDER_SITE_OTHER): Payer: BLUE CROSS/BLUE SHIELD | Admitting: Cardiovascular Disease

## 2016-01-14 VITALS — BP 92/60 | HR 59 | Ht 71.0 in | Wt 164.4 lb

## 2016-01-14 DIAGNOSIS — I6521 Occlusion and stenosis of right carotid artery: Secondary | ICD-10-CM

## 2016-01-14 NOTE — Patient Instructions (Signed)

## 2016-03-06 ENCOUNTER — Other Ambulatory Visit: Payer: Self-pay | Admitting: Cardiovascular Disease

## 2016-05-24 ENCOUNTER — Encounter: Payer: Self-pay | Admitting: Family

## 2016-05-31 ENCOUNTER — Ambulatory Visit (INDEPENDENT_AMBULATORY_CARE_PROVIDER_SITE_OTHER): Payer: BLUE CROSS/BLUE SHIELD | Admitting: Family

## 2016-05-31 ENCOUNTER — Ambulatory Visit (HOSPITAL_COMMUNITY)
Admission: RE | Admit: 2016-05-31 | Discharge: 2016-05-31 | Disposition: A | Discharge status: Home / Self Care | Payer: BLUE CROSS/BLUE SHIELD | Source: Ambulatory Visit | Attending: Family | Admitting: Family

## 2016-05-31 ENCOUNTER — Encounter: Payer: Self-pay | Admitting: Family

## 2016-05-31 VITALS — BP 117/75 | HR 71 | Temp 98.4°F | Resp 16 | Ht 71.0 in | Wt 162.0 lb

## 2016-05-31 DIAGNOSIS — Z9889 Other specified postprocedural states: Secondary | ICD-10-CM

## 2016-05-31 DIAGNOSIS — I6521 Occlusion and stenosis of right carotid artery: Secondary | ICD-10-CM

## 2016-05-31 DIAGNOSIS — I6522 Occlusion and stenosis of left carotid artery: Secondary | ICD-10-CM | POA: Diagnosis not present

## 2016-05-31 DIAGNOSIS — I6523 Occlusion and stenosis of bilateral carotid arteries: Secondary | ICD-10-CM | POA: Diagnosis not present

## 2016-05-31 LAB — VAS US CAROTID
LEFT ECA DIAS: -23 cm/s
LEFT VERTEBRAL DIAS: -12 cm/s
LICADDIAS: -30 cm/s
LICAPDIAS: -46 cm/s
Left CCA dist dias: -27 cm/s
Left CCA dist sys: -87 cm/s
Left CCA prox dias: 33 cm/s
Left CCA prox sys: 121 cm/s
Left ICA dist sys: -82 cm/s
Left ICA prox sys: -115 cm/s
RIGHT ECA DIAS: -9 cm/s
RIGHT VERTEBRAL DIAS: 13 cm/s
Right CCA prox dias: 23 cm/s
Right CCA prox sys: 100 cm/s
Right cca dist sys: -90 cm/s

## 2016-05-31 NOTE — Progress Notes (Signed)
Chief Complaint: Follow up Extracranial Carotid Artery Stenosis   History of Present Illness  Andrew Sampson is a 56 y.o. male patient of Dr. Edilia Bo who is s/p right carotid endarterectomy in September 2015.  He comes in for a follow up visit. He had a TIA preoperatively as manifested by left side facial droop and tingling, slurred speech. He denies any subsequent stroke or TIA.   He does complain of a headache in the back of his neck when he sleeps on his right side but has a history of cervical disc disease.  He is on aspirin and Praluent, cannot tolerate statins. He eats well and exercises quite a bit.  He states he has significant family hx for CVD and dyslipidemia.   Pt Diabetic: no Pt smoker: non-smoker  Pt meds include: Statin : no, has muscle weakness and aching from statin, takes Praluent  ASA: yes Other anticoagulants/antiplatelets: no   Past Medical History:  Diagnosis Date  . Anxiety   . Atypical chest pain   . Familial hyperlipidemia    LDL 212 on 01/12/13  . GERD (gastroesophageal reflux disease)   . Heart burn   . Hyperlipidemia    statin intolerant  . SEMI (subendocardial myocardial infarction) [facility])     Social History Social History  Substance Use Topics  . Smoking status: Never Smoker  . Smokeless tobacco: Never Used  . Alcohol use 0.0 oz/week     Comment: occasional    Family History Family History  Problem Relation Age of Onset  . Heart attack Father   . Heart disease Father   . Hypertension Father   . Hyperlipidemia Father     Surgical History Past Surgical History:  Procedure Laterality Date  . CERVICAL SPINE SURGERY  approx 2011   C3,C4, C5 diskectomy  . CORONARY ARTERY BYPASS GRAFT N/A 01/15/2013   Procedure: CORONARY ARTERY BYPASS GRAFTING (CABG) TIMES FOUR USING LEFT INTERNAL MAMMARY ARTERY AND RIGHT SAPHENOUS LEG VEIN HARVESTED ENDOSCOPICALLY;  Surgeon: [name], MD;  Location: [facility] OR;  Service: Open Heart Surgery;   Laterality: N/A;  . ENDARTERECTOMY Right 09/30/2013   Procedure: Right Carotid Endarterectomy with Patch;  Surgeon: [name], MD;  Location: [facility] OR;  Service: Vascular;  Laterality: Right;  . HAND SURGERY    . I&D EXTREMITY Left 07/21/2013   Procedure: IRRIGATION AND DEBRIDEMENT EXTREMITY;  Surgeon: [name], MD;  Location: [facility];  Service: Orthopedics;  Laterality: Left;  . INTRAOPERATIVE TRANSESOPHAGEAL ECHOCARDIOGRAM N/A 01/15/2013   Procedure: INTRAOPERATIVE TRANSESOPHAGEAL ECHOCARDIOGRAM;  Surgeon: [name], MD;  Location: [facility] OR;  Service: Open Heart Surgery;  Laterality: N/A;  . LEFT HEART CATH N/A 01/11/2013   Procedure: LEFT HEART CATH;  Surgeon: [name], MD;  Location: [facility] CATH LAB;  Service: Cardiovascular;  Laterality: N/A;  . TONSILLECTOMY      Allergies  Allergen Reactions  . Statins     Simvastatin (myalgias), Crestor (myalgias), Lipitor (with PCP in past had myalgias - tolerating this well currently)  . Zetia [Ezetimibe]     Muscle aches    Current Outpatient Prescriptions  Medication Sig Dispense Refill  . acyclovir (ZOVIRAX) 200 MG capsule Take 400 mg by mouth at bedtime.    Marland Kitchen aspirin EC 325 MG EC tablet Take 1 tablet (325 mg total) by mouth daily.    . clonazePAM (KLONOPIN) 0.5 MG tablet Take 0.5 mg by mouth at bedtime as needed for anxiety.     Marland Kitchen  Coenzyme Q10 200 MG capsule Take 200 mg by mouth daily.     Marland Kitchen esomeprazole (NEXIUM) 40 MG capsule Take 40 mg by mouth daily at 12 noon.    Marland Kitchen ibuprofen (ADVIL,MOTRIN) 200 MG tablet Take 400 mg by mouth every 6 (six) hours as needed for moderate pain.    Marland Kitchen loratadine (CLARITIN) 10 MG tablet Take 10 mg by mouth daily as needed for allergies.     . Omega-3 Fatty Acids (OMEGA 3 PO) Take 2 tablets by mouth daily.    Marland Kitchen PRALUENT 75 MG/ML SOPN inject 75 mg under the skin every 14 days 2 pen 5  . RESTASIS 0.05 % ophthalmic emulsion Place 1 drop into both eyes at bedtime.       No current facility-administered medications for this visit.     Review of Systems : See HPI for pertinent positives and negatives.  Physical Examination  Vitals:   05/31/16 1136 05/31/16 1139  BP: 117/75 117/75  Pulse: 71 71  Resp: 16   Temp: 98.4 F (36.9 C)   SpO2: 98%   Weight: 162 lb (73.5 kg)   Height:  (1.803 m)    Body mass index is 22.59 kg/m.  General: WDWN fit appearing male in NAD GAIT: normal Eyes: PERRLA Pulmonary:  Respirations are non-labored, good air movement, CTAB, no rales, rhonchi, or wheezing.  Cardiac: regular rhythm, no detected murmur.  VASCULAR EXAM Carotid Bruits Right Left   Negative Negative    Aorta is not palpable. Radial pulses are 2+ palpable and equal.                                                                                                                            LE Pulses Right Left       POPLITEAL  not palpable   not palpable       POSTERIOR TIBIAL   palpable    palpable        DORSALIS PEDIS      ANTERIOR TIBIAL  palpable   palpable     Gastrointestinal: soft, nontender, BS WNL, no r/g, no palpable masses.  Musculoskeletal: No muscle atrophy/wasting. M/S 5/5 throughout, extremities without ischemic changes.  Neurologic: A&O X 3; Appropriate Affect, Speech is normal CN 2-12 intact, pain and light touch intact in extremities, Motor exam as listed above.     Assessment: Andrew Sampson is a 56 y.o. male who is s/p right carotid endarterectomy in September 2015. He had a preoperative TIA, no subsequent TIA or stroke.   He appears fit and exercises a great deal, is diligent about healthy choices of his diet.   He does not have DM and never used tobacco. He is statin intolerant, takes Praluent instead.  DATA Today's carotid duplex suggests no significant stenosis of the bilateral ECA or CCA. Right CEA site with no restenosis. Left ICA with 40-59% stenosis. Bilateral vertebral artery flow is antegrade.   Bilateral subclavian artery waveforms  are normal.  Slight increase in the left ICA stenosis compared to the last exam on 05-19-15.    Plan: Follow-up in 6 months with Carotid Duplex scan.   I discussed in depth with the patient the nature of atherosclerosis, and emphasized the importance of maximal medical management including strict control of blood pressure, blood glucose, and lipid levels, obtaining regular exercise, and continued cessation of smoking.  The patient is aware that without maximal medical management the underlying atherosclerotic disease process will progress, limiting the benefit of any interventions. The patient was given information about stroke prevention and what symptoms should prompt the patient to seek immediate medical care. Thank you for allowing [place] to participate in this patient's care.  Charisse March, RN, MSN, FNP-C [facility] Office: [phone]  Clinic Physician: Edilia Bo  05/31/16 11:51 AM

## 2016-05-31 NOTE — Patient Instructions (Signed)
Stroke Prevention Some medical conditions and behaviors are associated with an increased chance of having a stroke. You may prevent a stroke by making healthy choices and managing medical conditions. How can I reduce my risk of having a stroke?  Stay physically active. Get at least 30 minutes of activity on most or all days.  Do not smoke. It may also be helpful to avoid exposure to secondhand smoke.  Limit alcohol use. Moderate alcohol use is considered to be:  No more than 2 drinks per day for men.  No more than 1 drink per day for nonpregnant women.  Eat healthy foods. This involves:  Eating 5 or more servings of fruits and vegetables a day.  Making dietary changes that address high blood pressure (hypertension), high cholesterol, diabetes, or obesity.  Manage your cholesterol levels.  Making food choices that are high in fiber and low in saturated fat, trans fat, and cholesterol may control cholesterol levels.  Take any prescribed medicines to control cholesterol as directed by your health care provider.  Manage your diabetes.  Controlling your carbohydrate and sugar intake is recommended to manage diabetes.  Take any prescribed medicines to control diabetes as directed by your health care provider.  Control your hypertension.  Making food choices that are low in salt (sodium), saturated fat, trans fat, and cholesterol is recommended to manage hypertension.  Ask your health care provider if you need treatment to lower your blood pressure. Take any prescribed medicines to control hypertension as directed by your health care provider.  If you are 18-39 years of age, have your blood pressure checked every 3-5 years. If you are 40 years of age or older, have your blood pressure checked every year.  Maintain a healthy weight.  Reducing calorie intake and making food choices that are low in sodium, saturated fat, trans fat, and cholesterol are recommended to manage  weight.  Stop drug abuse.  Avoid taking birth control pills.  Talk to your health care provider about the risks of taking birth control pills if you are over 35 years old, smoke, get migraines, or have ever had a blood clot.  Get evaluated for sleep disorders (sleep apnea).  Talk to your health care provider about getting a sleep evaluation if you snore a lot or have excessive sleepiness.  Take medicines only as directed by your health care provider.  For some people, aspirin or blood thinners (anticoagulants) are helpful in reducing the risk of forming abnormal blood clots that can lead to stroke. If you have the irregular heart rhythm of atrial fibrillation, you should be on a blood thinner unless there is a good reason you cannot take them.  Understand all your medicine instructions.  Make sure that other conditions (such as anemia or atherosclerosis) are addressed. Get help right away if:  You have sudden weakness or numbness of the face, arm, or leg, especially on one side of the body.  Your face or eyelid droops to one side.  You have sudden confusion.  You have trouble speaking (aphasia) or understanding.  You have sudden trouble seeing in one or both eyes.  You have sudden trouble walking.  You have dizziness.  You have a loss of balance or coordination.  You have a sudden, severe headache with no known cause.  You have new chest pain or an irregular heartbeat. Any of these symptoms may represent a serious problem that is an emergency. Do not wait to see if the symptoms will go away.   Get medical help at once. Call your local emergency services (911 in [place] Do not drive yourself to the hospital. This information is not intended to replace advice given to you by your health care provider. Make sure you discuss any questions you have with your health care provider. Document Released: 02/24/2004 Document Revised: 06/24/2015 Document Reviewed: 07/19/2012 Elsevier  Interactive Patient Education  2017 Elsevier Inc.  

## 2016-06-01 ENCOUNTER — Telehealth: Payer: Self-pay | Admitting: Cardiovascular Disease

## 2016-06-01 DIAGNOSIS — R7301 Impaired fasting glucose: Secondary | ICD-10-CM | POA: Diagnosis not present

## 2016-06-01 DIAGNOSIS — E7800 Pure hypercholesterolemia, unspecified: Secondary | ICD-10-CM | POA: Diagnosis not present

## 2016-06-01 DIAGNOSIS — F411 Generalized anxiety disorder: Secondary | ICD-10-CM | POA: Diagnosis not present

## 2016-06-01 DIAGNOSIS — Z125 Encounter for screening for malignant neoplasm of prostate: Secondary | ICD-10-CM | POA: Diagnosis not present

## 2016-06-01 DIAGNOSIS — Z0000 Encounter for general adult medical examination without abnormal findings: Secondary | ICD-10-CM | POA: Diagnosis not present

## 2016-06-01 DIAGNOSIS — I2510 Atherosclerotic heart disease of native coronary artery without angina pectoris: Secondary | ICD-10-CM | POA: Diagnosis not present

## 2016-06-01 NOTE — Telephone Encounter (Signed)
New message      Pt has an appt June 1 for follow up.  Will he need to be fasting for this appt?  Please call

## 2016-06-01 NOTE — Telephone Encounter (Signed)
Informed patient that he does not need to be fasting for his appointment in June, since it is later in the day. Patient stated he does not have a problem coming back to our office for blood work if he needs to. Patient stated he recently had [facility] and will have them send our office a copy. Informed patient that once we get lab work from his PCP that we can scan the results into his chart. Patient verbalized understanding.

## 2016-06-01 NOTE — Addendum Note (Signed)
Addended by: Inayah Woodin A on: 06/01/2016 09:37 AM   Modules accepted: Orders  

## 2016-06-08 ENCOUNTER — Encounter: Payer: Self-pay | Admitting: Cardiovascular Disease

## 2016-06-17 DIAGNOSIS — H5213 Myopia, bilateral: Secondary | ICD-10-CM | POA: Diagnosis not present

## 2016-06-28 NOTE — Progress Notes (Signed)
Patient ID: Andrew Sampson, male   DOB: March 19, 1960, 56 y.o.   MRN: [id number]   Mr. Heuring is a 56 y.o.  white male with no known history of CAD. He presented to [facility] in December 14  with a three week history on increasing episodes of substernal chest pain with radiation into his neck. He described the pain as a "burning" sensation. On the day of presentation the patient was biking riding on a local trail. He states he noticed increasing symptoms of chest discomfort, numbness in left arm, and a near syncopal episode. He presented to [facility] Emergency Department where he was admitted for observation. Serial cardiac enzymes were positive and the patient was transferred to [facility] for further care.  Subsequent cath showed 3VD with D1 being culprit for SEMI EF 45% 01/10/13   CABG by Dr [name]: 01/16/13  SURGICAL PROCEDURE: Coronary artery bypass grafting x4 with left  internal mammary to the left anterior descending coronary artery,  reverse saphenous vein graft to the intermediate coronary artery,  reverse saphenous vein graft to the first obtuse marginal coronary  artery, reverse saphenous vein graft to the right coronary artery with  right thigh and calf endo vein harvesting.    09/30/13  Had R CEA for TIA despite preop dopplers only showing moderate stenosis   Has muscle weakness with multiple statins and it really effects his cycling  Now on praluent and followed in lipid clinic Giving himself shots   ETT done 10/08/15 normal except for HTN response to exercise Home BP readings reviewed.   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  All other systems reviewed and negative  General: Affect appropriate Healthy:  appears stated age HEENT: normal Neck supple with no adenopathy JVP normal no bruits no thyromegaly post Right CEA Lungs clear with no wheezing and good diaphragmatic motion Heart:  S1/S2 no murmur, no rub, gallop or click post sternotomy PMI normal Abdomen: benighn, BS positve, no tenderness, no AAA no bruit.  No HSM or HJR Distal pulses intact with no bruits No edema Neuro non-focal Skin warm and dry No muscular weakness    Current Outpatient Prescriptions  Medication Sig Dispense Refill  . acyclovir (ZOVIRAX) 200 MG capsule Take 400 mg by mouth at bedtime.    Marland Kitchen aspirin EC 325 MG EC tablet Take 1 tablet (325 mg total) by mouth daily.    . clonazePAM (KLONOPIN) 0.5 MG tablet Take 0.5 mg by mouth at bedtime as needed for anxiety.     . Coenzyme Q10 200 MG capsule Take 200 mg by mouth daily.     Marland Kitchen esomeprazole (NEXIUM) 40 MG capsule Take 40 mg by mouth daily at 12 noon.    Marland Kitchen ibuprofen (ADVIL,MOTRIN) 200 MG tablet Take 400 mg by mouth every 6 (six) hours as needed for moderate pain.    Marland Kitchen loratadine (CLARITIN) 10 MG tablet Take 10 mg by mouth daily as needed for allergies.     Marland Kitchen PRALUENT 75 MG/ML SOPN inject 75 mg under the skin every 14 days 2 pen 5  . RESTASIS 0.05 % ophthalmic emulsion Place 1 drop into both  eyes at bedtime.      No current facility-administered medications for this visit.     Allergies  Statins and Zetia [ezetimibe]  Electrocardiogram:  SR normal 9/15   09/21/14  SR rate 62  Normal ECG  06/30/16 SR rate 64 normal ECG  Assessment and Plan  CAD:  Stable with no angina and good activity level.  Continue medical Rx 3 years post CABG   ETT normal 10/08/15 CEA: no change in residual bruit Duplex 1/4/785/2/18 40-59% LICA but low end of range Has f/u with [facility]  Palpitations:  Occurs at high HR with sudden exertion on bike advised him to get HR monitor And wear it when  he rides. Doubt SVT or PAF.  No beta blocker due to fatigue Chol;  On praluent   Labs from primary 06/01/16 LDL 84 with normal LFTls   Lab Results  Component Value Date   LDLCALC 94 09/17/2015   BP:  HTN response to exercise on ETT done in September peak 186 mmHg systolic home Readings are ok No beta blocker due to fatigue   F/U with me in 6 months   Charlton HawsPeter Shakea Isip

## 2016-06-30 ENCOUNTER — Encounter (INDEPENDENT_AMBULATORY_CARE_PROVIDER_SITE_OTHER): Payer: Self-pay

## 2016-06-30 ENCOUNTER — Encounter: Payer: Self-pay | Admitting: Cardiovascular Disease

## 2016-06-30 ENCOUNTER — Ambulatory Visit (INDEPENDENT_AMBULATORY_CARE_PROVIDER_SITE_OTHER): Payer: BLUE CROSS/BLUE SHIELD | Admitting: Cardiovascular Disease

## 2016-06-30 VITALS — BP 118/78 | HR 64 | Ht 71.0 in | Wt 162.1 lb

## 2016-06-30 DIAGNOSIS — I6521 Occlusion and stenosis of right carotid artery: Secondary | ICD-10-CM | POA: Diagnosis not present

## 2016-06-30 NOTE — Patient Instructions (Addendum)

## 2016-08-08 ENCOUNTER — Other Ambulatory Visit: Payer: Self-pay | Admitting: Pharmacist

## 2016-08-08 MED ORDER — ALIROCUMAB 75 MG/ML ~~LOC~~ SOPN: 1.0000 "pen " | PEN_INJECTOR | SUBCUTANEOUS | 11 refills | Status: DC

## 2016-11-14 ENCOUNTER — Telehealth: Payer: Self-pay | Admitting: Cardiovascular Disease

## 2016-11-14 NOTE — Telephone Encounter (Signed)
error 

## 2016-11-16 ENCOUNTER — Telehealth: Payer: Self-pay | Admitting: *Deleted

## 2016-11-16 NOTE — Telephone Encounter (Signed)
Praluent PA renewal was already completed and approved.

## 2016-11-16 NOTE — Telephone Encounter (Signed)
[employer] calling regarding PRALUENT, I transferred this call to pharmacist, Margaretmary DysMegan, Supple, PharmD

## 2016-12-13 ENCOUNTER — Encounter: Payer: Self-pay | Admitting: Family

## 2016-12-13 ENCOUNTER — Ambulatory Visit (HOSPITAL_COMMUNITY)
Admission: RE | Admit: 2016-12-13 | Discharge: 2016-12-13 | Disposition: A | Discharge status: Home / Self Care | Payer: BLUE CROSS/BLUE SHIELD | Source: Ambulatory Visit | Attending: Family | Admitting: Family

## 2016-12-13 ENCOUNTER — Ambulatory Visit (INDEPENDENT_AMBULATORY_CARE_PROVIDER_SITE_OTHER): Payer: BLUE CROSS/BLUE SHIELD | Admitting: Family

## 2016-12-13 VITALS — BP 116/80 | HR 61 | Temp 98.0°F | Resp 20 | Ht 71.0 in | Wt 162.0 lb

## 2016-12-13 DIAGNOSIS — Z9889 Other specified postprocedural states: Secondary | ICD-10-CM | POA: Diagnosis not present

## 2016-12-13 DIAGNOSIS — I6522 Occlusion and stenosis of left carotid artery: Secondary | ICD-10-CM | POA: Diagnosis not present

## 2016-12-13 DIAGNOSIS — I6521 Occlusion and stenosis of right carotid artery: Secondary | ICD-10-CM | POA: Diagnosis not present

## 2016-12-13 LAB — VAS US CAROTID
LEFT ECA DIAS: -11 cm/s
LEFT VERTEBRAL DIAS: 13 cm/s
LICAPDIAS: -31 cm/s
LICAPSYS: -79 cm/s
Left CCA dist dias: 25 cm/s
Left CCA dist sys: 82 cm/s
Left CCA prox dias: 30 cm/s
Left CCA prox sys: 116 cm/s
Left ICA dist dias: -37 cm/s
Left ICA dist sys: -91 cm/s
RIGHT CCA MID DIAS: 29 cm/s
RIGHT ECA DIAS: -9 cm/s
RIGHT VERTEBRAL DIAS: 7 cm/s
Right CCA prox dias: 23 cm/s
Right CCA prox sys: 105 cm/s
Right cca dist sys: -92 cm/s

## 2016-12-13 NOTE — Patient Instructions (Signed)

## 2016-12-13 NOTE — Progress Notes (Signed)
Chief Complaint: Follow up Extracranial Carotid Artery Stenosis   History of Present Illness  Andrew Sampson is a 56 y.o. male patient of Dr. Edilia Boickson who is s/p right carotid endarterectomy in September 2015.  He comes in for a follow up visit. He had a TIA preoperatively as manifested by left side facial droop and tingling, slurred speech. He denies any subsequent stroke or TIA.   He does complain of a headache in the back of his neck when he sleeps on his right side but has a history of cervical disc disease.  He is on aspirin and Praluent, cannot tolerate statins. He eats well and exercises quite a bit.  He states he has significant family hx for CVD and dyslipidemia.   Pt Diabetic: no Pt smoker: non-smoker  Pt meds include: Statin : no, has muscle weakness and aching from statin, takes Praluent, states his cholesterol remain in good control ASA: yes Other anticoagulants/antiplatelets: no   Past Medical History:  Diagnosis Date  . Anxiety   . Atypical chest pain   . Carotid artery occlusion   . Familial hyperlipidemia    LDL 212 on 01/12/13  . GERD (gastroesophageal reflux disease)   . Heart burn   . Hyperlipidemia    statin intolerant  . SEMI (subendocardial myocardial infarction) [facility](HCC)     Social History Social History   Tobacco Use  . Smoking status: Never Smoker  . Smokeless tobacco: Never Used  Substance Use Topics  . Alcohol use: Yes    Alcohol/week: 0.0 oz    Comment: occasional  . Drug use: No    Family History Family History  Problem Relation Age of Onset  . Heart attack Father   . Heart disease Father   . Hypertension Father   . Hyperlipidemia Father     Surgical History Past Surgical History:  Procedure Laterality Date  . CERVICAL SPINE SURGERY  approx 2011   C3,C4, C5 diskectomy  . HAND SURGERY    . TONSILLECTOMY      Allergies  Allergen Reactions  . Statins     Simvastatin (myalgias), Crestor (myalgias), Lipitor (with PCP  in past had myalgias - tolerating this well currently)  . Zetia [Ezetimibe]     Muscle aches    Current Outpatient Medications  Medication Sig Dispense Refill  . acyclovir (ZOVIRAX) 200 MG capsule Take 400 mg by mouth at bedtime.    . Alirocumab (PRALUENT) 75 MG/ML SOPN Inject 1 pen into the skin every 14 (fourteen) days. 2 pen 11  . aspirin EC 325 MG EC tablet Take 1 tablet (325 mg total) by mouth daily.    . clonazePAM (KLONOPIN) 0.5 MG tablet Take 0.5 mg by mouth at bedtime as needed for anxiety.     . Coenzyme Q10 200 MG capsule Take 200 mg by mouth daily.     Marland Kitchen. esomeprazole (NEXIUM) 40 MG capsule Take 40 mg by mouth daily at 12 noon.    Marland Kitchen. ibuprofen (ADVIL,MOTRIN) 200 MG tablet Take 400 mg by mouth every 6 (six) hours as needed for moderate pain.    Marland Kitchen. loratadine (CLARITIN) 10 MG tablet Take 10 mg by mouth daily as needed for allergies.     . RESTASIS 0.05 % ophthalmic emulsion Place 1 drop into both eyes at bedtime.      No current facility-administered medications for this visit.     Review of Systems : See HPI for pertinent positives and negatives.  Physical Examination  Vitals:  12/13/16 1154 12/13/16 1155  BP: 127/80 116/80  Pulse: 61   Resp: 20   Temp: 98 F (36.7 C)   TempSrc: Oral   SpO2: 96%   Weight: 162 lb (73.5 kg)   Height: 5\' 11"  (1.803 m)    Body mass index is 22.59 kg/m.  General: WDWN fit appearing male in NAD GAIT: normal Eyes: PERRLA Pulmonary:  Respirations are non-labored, good air movement, CTAB, no rales, rhonchi, or wheezing.  Cardiac: regular rhythm, no detected murmur.  VASCULAR EXAM Carotid Bruits Right Left   Negative Negative     Abdominal aortic pulse is not palpable. Radial pulses are 2+ palpable and equal.                                                                                                                                          LE Pulses Right Left       POPLITEAL  not palpable   not palpable       POSTERIOR  TIBIAL   palpable    palpable        DORSALIS PEDIS      ANTERIOR TIBIAL  palpable   palpable     Gastrointestinal: soft, nontender, BS WNL, no r/g, no palpable masses.  Musculoskeletal: No muscle atrophy/wasting. M/S 5/5 throughout, extremities without ischemic changes.  Neurologic: A&O X 3; appropriate affect, speech is normal, CN 2-12 intact, pain and light touch intact in extremities, Motor exam as listed above.    Assessment: Andrew Sampson is a 56 y.o. male who is s/p right carotid endarterectomy in September 2015. He had a preoperative TIA, no subsequent TIA or stroke.   He appears fit and exercises a great deal, is diligent about healthy choices of his diet.   He does not have DM and never used tobacco. He is statin intolerant, takes Praluent instead.  DATA Carotid Duplex (12/13/16): Right ICA: CEA site with no restenosis or hyperplasia.  Left ICA: 40-59% stenosis (low end of range).  Bilateral vertebral artery flow is antegrade.  Bilateral subclavian artery waveforms are normal.  No significant change compared to the exam on 05-31-16.    Plan:  Follow-up in 1 year with Carotid Duplex scan.    I discussed in depth with the patient the nature of atherosclerosis, and emphasized the importance of maximal medical management including strict control of blood pressure, blood glucose, and lipid levels, obtaining regular exercise, and continued cessation of smoking.  The patient is aware that without maximal medical management the underlying atherosclerotic disease process will progress, limiting the benefit of any interventions. The patient was given information about stroke prevention and what symptoms should prompt the patient to seek immediate medical care. Thank you for allowing us to participate in this patient's care.  Charisse MarchSuzanne Jiaire Rosebrook, RN, MSN, FNP-C [facility] Office: [phone]  Clinic Physician: Edilia BoDickson  12/13/16  12:09 PM

## 2016-12-28 NOTE — Addendum Note (Signed)
Addended by: Burton ApleyPETTY, Letti Towell A on: 12/28/2016 10:11 AM   Modules accepted: Orders

## 2017-06-05 DIAGNOSIS — H8149 Vertigo of central origin, unspecified ear: Secondary | ICD-10-CM | POA: Diagnosis not present

## 2017-06-19 DIAGNOSIS — Z125 Encounter for screening for malignant neoplasm of prostate: Secondary | ICD-10-CM | POA: Diagnosis not present

## 2017-06-19 DIAGNOSIS — Z0000 Encounter for general adult medical examination without abnormal findings: Secondary | ICD-10-CM | POA: Diagnosis not present

## 2017-06-19 DIAGNOSIS — R7301 Impaired fasting glucose: Secondary | ICD-10-CM | POA: Diagnosis not present

## 2017-06-19 DIAGNOSIS — E7800 Pure hypercholesterolemia, unspecified: Secondary | ICD-10-CM | POA: Diagnosis not present

## 2017-06-23 DIAGNOSIS — H5213 Myopia, bilateral: Secondary | ICD-10-CM | POA: Diagnosis not present

## 2017-06-27 ENCOUNTER — Encounter: Payer: Self-pay | Admitting: Cardiovascular Disease

## 2017-06-27 ENCOUNTER — Ambulatory Visit (INDEPENDENT_AMBULATORY_CARE_PROVIDER_SITE_OTHER): Payer: BLUE CROSS/BLUE SHIELD | Admitting: Cardiovascular Disease

## 2017-06-27 VITALS — BP 134/88 | HR 71 | Ht 71.0 in | Wt 164.5 lb

## 2017-06-27 DIAGNOSIS — E785 Hyperlipidemia, unspecified: Secondary | ICD-10-CM

## 2017-06-27 DIAGNOSIS — I6523 Occlusion and stenosis of bilateral carotid arteries: Secondary | ICD-10-CM

## 2017-06-27 DIAGNOSIS — I25810 Atherosclerosis of coronary artery bypass graft(s) without angina pectoris: Secondary | ICD-10-CM | POA: Diagnosis not present

## 2017-06-27 MED ORDER — MECLIZINE HCL 25 MG PO TABS: 25.0000 mg | ORAL_TABLET | Freq: Three times a day (TID) | ORAL | 1 refills | Status: DC | PRN

## 2017-06-27 MED ORDER — ONDANSETRON HCL 4 MG PO TABS: 4.0000 mg | ORAL_TABLET | Freq: Three times a day (TID) | ORAL | 1 refills | Status: DC | PRN

## 2017-06-27 NOTE — Progress Notes (Signed)
Patient ID: Andrew Sampson, male   DOB: Mar 04, 1960, 57 y.o.   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    09/30/13  Had R CEA for TIA despite preop dopplers only showing moderate stenosis   Has muscle weakness with multiple statins and it really effects his cycling  Now on praluent and followed in lipid clinic Giving himself shots   ETT done 10/08/15 normal except for HTN response to exercise Home BP readings reviewed.   Has son Graduated  at [employer] trying to get into law school  Daughter Senior   HP And Daughter going to [employer]   Had an episode of dizziness that sounded like vertigo Seen by Pollyann Kennedy and supposed to get MRI Has not recurred  Also has elevated PSA  ROS: Denies fever, malais, weight loss, blurry vision, decreased visual acuity, cough, sputum, SOB, hemoptysis, pleuritic pain, palpitaitons, heartburn, abdominal pain, melena, lower extremity edema, claudication, or rash.  All other systems reviewed and negative  General: BP 134/88   Pulse 71   Ht  (1.803 m)   Wt 164 lb 8 oz (74.6 kg)   SpO2 96%   BMI 22.94 kg/m  Affect appropriate Healthy:  appears stated age HEENT: normal Neck supple with no adenopathy JVP normal post Right CEA no bruits no thyromegaly Lungs clear with no wheezing and good diaphragmatic motion Heart:  S1/S2 no murmur, no rub, gallop or click PMI normal post sternotomy  Abdomen: benighn, BS positve, no tenderness, no AAA no bruit.  No HSM or HJR Distal pulses intact with no bruits No edema Neuro  non-focal Skin warm and dry No muscular weakness     Current Outpatient Medications  Medication Sig Dispense Refill  . acyclovir (ZOVIRAX) 200 MG capsule Take 400 mg by mouth at bedtime.    . Alirocumab (PRALUENT) 75 MG/ML SOPN Inject 1 pen into the skin every 14 (fourteen) days. 2 pen 11  . aspirin EC 325 MG EC tablet Take 1 tablet (325 mg total) by mouth daily.    . clonazePAM (KLONOPIN) 0.5 MG tablet Take 0.5 mg by mouth at bedtime as needed for anxiety.     . Coenzyme Q10 200 MG capsule Take 200 mg by mouth daily.     Marland Kitchen esomeprazole (NEXIUM) 40 MG capsule Take 40 mg by mouth daily at 12 noon.    Marland Kitchen ibuprofen (ADVIL,MOTRIN) 200 MG tablet Take 400 mg by mouth every 6 (six) hours as needed for moderate pain.    Marland Kitchen loratadine (CLARITIN) 10 MG tablet Take 10 mg by mouth daily as needed for allergies.     . RESTASIS 0.05 % ophthalmic emulsion Place 1 drop into both eyes at bedtime.      No current facility-administered medications for this visit.     Allergies  Statins and Zetia [ezetimibe]  Electrocardiogram:  SR normal 9/15   09/21/14  SR rate 62  Normal ECG  06/30/16 SR rate 64 normal ECG  Assessment and Plan  CAD:  Stable with no angina and good activity level.  Continue medical  Rx 3 years post CABG   ETT normal 10/08/15  CEA: no change in residual bruit Duplex 16/10/96 40-59% LICA but low end of range Has f/u with [facility]   Chol;  On praluent   Labs from primary 06/01/16 LDL 84 with normal LFTls   Lab Results  Component Value Date   LDLCALC 94 09/17/2015   BP:  HTN response to exercise on ETT  Home readings ok Fatigue with beta blocker   Dizziness:  Encouraged to f/u with MRI Meclizine and Zofran called in   PSA:  Indicated PSA elevated last office visit with Dr Clelia Croft Told to limit bicycle riding and repeat in  3 months   F/U with me in 6 months   [name]

## 2017-06-27 NOTE — Patient Instructions (Addendum)
Medication Instructions:  Your physician has recommended you make the following change in your medication:  1-TAKE Meclizine 25 mg by mouth three times daily as needed for vertigo. 2-TAKE Zofran 4 mg by mouth every 8 hours has needed for nausea.    Labwork: NONE  Testing/Procedures: NONE  Follow-Up: Your physician wants you to follow-up in: 6 months with Dr. Eden Emms. You will receive a reminder letter in the mail two months in advance. If you don't receive a letter, please call our office to schedule the follow-up appointment.   If you need a refill on your cardiac medications before your next appointment, please call your pharmacy.

## 2017-07-03 ENCOUNTER — Other Ambulatory Visit: Payer: Self-pay | Admitting: Pharmacist

## 2017-07-03 MED ORDER — ALIROCUMAB 75 MG/ML ~~LOC~~ SOPN: 1.0000 "pen " | PEN_INJECTOR | SUBCUTANEOUS | 11 refills | Status: DC

## 2017-09-12 DIAGNOSIS — Z01818 Encounter for other preprocedural examination: Secondary | ICD-10-CM | POA: Diagnosis not present

## 2017-09-20 DIAGNOSIS — R972 Elevated prostate specific antigen [PSA]: Secondary | ICD-10-CM | POA: Diagnosis not present

## 2017-09-24 DIAGNOSIS — R2689 Other abnormalities of gait and mobility: Secondary | ICD-10-CM | POA: Diagnosis not present

## 2017-09-28 ENCOUNTER — Other Ambulatory Visit: Payer: Self-pay | Admitting: Otolaryngology

## 2017-09-28 DIAGNOSIS — R2689 Other abnormalities of gait and mobility: Secondary | ICD-10-CM

## 2017-10-05 ENCOUNTER — Ambulatory Visit
Admission: RE | Admit: 2017-10-05 | Discharge: 2017-10-05 | Disposition: A | Discharge status: Home / Self Care | Payer: BLUE CROSS/BLUE SHIELD | Source: Ambulatory Visit | Attending: Otolaryngology | Admitting: Otolaryngology

## 2017-10-05 DIAGNOSIS — R42 Dizziness and giddiness: Secondary | ICD-10-CM | POA: Diagnosis not present

## 2017-10-05 DIAGNOSIS — R2689 Other abnormalities of gait and mobility: Secondary | ICD-10-CM

## 2017-10-05 MED ORDER — GADOBENATE DIMEGLUMINE 529 MG/ML IV SOLN
14.0000 mL | Freq: Once | INTRAVENOUS | Status: AC | PRN
  Administered 2017-10-05: 14 mL via INTRAVENOUS

## 2017-10-07 ENCOUNTER — Other Ambulatory Visit: Payer: BLUE CROSS/BLUE SHIELD

## 2017-10-30 DIAGNOSIS — I6523 Occlusion and stenosis of bilateral carotid arteries: Secondary | ICD-10-CM | POA: Diagnosis not present

## 2017-10-30 DIAGNOSIS — H903 Sensorineural hearing loss, bilateral: Secondary | ICD-10-CM | POA: Diagnosis not present

## 2017-10-30 DIAGNOSIS — R42 Dizziness and giddiness: Secondary | ICD-10-CM | POA: Diagnosis not present

## 2017-10-30 DIAGNOSIS — I25810 Atherosclerosis of coronary artery bypass graft(s) without angina pectoris: Secondary | ICD-10-CM | POA: Diagnosis not present

## 2017-11-01 ENCOUNTER — Telehealth: Payer: Self-pay | Admitting: Pharmacist

## 2017-11-01 NOTE — Telephone Encounter (Signed)
Prior reauthorization required. Pt had lipid panel drawn at PCP on 06/19/17: TC 154, TG 130, LDL 100, HDL 29.  Will submit reauthorization for higher dose of Praluent 150 since pt's LDL goal is < 70.

## 2017-11-05 MED ORDER — ALIROCUMAB 75 MG/ML ~~LOC~~ SOPN: 1.0000 "pen " | PEN_INJECTOR | SUBCUTANEOUS | 11 refills | Status: DC

## 2017-11-05 NOTE — Addendum Note (Signed)
Addended by: Tenzin Edelman E on: 11/05/2017 01:35 PM   Modules accepted: Orders

## 2017-11-05 NOTE — Telephone Encounter (Signed)
Reauthorization approved through 10/31/18.

## 2017-11-20 DIAGNOSIS — H832X2 Labyrinthine dysfunction, left ear: Secondary | ICD-10-CM | POA: Diagnosis not present

## 2017-11-20 DIAGNOSIS — R42 Dizziness and giddiness: Secondary | ICD-10-CM | POA: Diagnosis not present

## 2017-12-25 NOTE — Progress Notes (Signed)
Patient ID: Andrew Sampson, male   DOB: Jan 01, 1961, 57 y.o.   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    09/30/13  Had R CEA for TIA despite preop dopplers only showing moderate stenosis   Has muscle weakness with multiple statins and it really effects his cycling  Now on praluent and followed in lipid clinic Giving himself shots   ETT done 10/08/15 normal except for HTN response to exercise Home BP readings reviewed.   Has son Graduated  at [employer] trying to get into law school  Daughter Senior   HP And Daughter going to [employer]   He has seasonal affective disorder and was quite pessimistic today Complains of excessive HR on occasion  Riding his bike up hill. Does not act like AVT or PAF. Passes within a minute or two with rest. Also worried  About frequency of stress testing needed now 59 years post bypass   ROS: Denies fever, malais, weight loss, blurry vision, decreased visual acuity, cough, sputum, SOB, hemoptysis, pleuritic pain, palpitaitons, heartburn, abdominal pain, melena, lower extremity edema, claudication, or rash.  All other systems reviewed and negative  General: BP 118/78   Pulse 69   Ht 5\' 11"  (1.803 m)   Wt 164 lb 4 oz (74.5 kg)   SpO2 98%   BMI 22.91 kg/m  Affect appropriate Healthy:  appears stated age HEENT: normal Neck supple with no adenopathy JVP normal right CEA no thyromegaly Lungs clear with no wheezing and good diaphragmatic motion Heart:  S1/S2 no murmur, no rub, gallop or click PMI normal post sternotomy   Abdomen: benighn, BS positve, no tenderness, no AAA no bruit.  No HSM or HJR Distal pulses intact with no bruits No edema Neuro non-focal Skin warm and dry No muscular weakness     Current Outpatient Medications  Medication Sig Dispense Refill  . acyclovir (ZOVIRAX) 200 MG capsule Take 400 mg by mouth at bedtime.    . Alirocumab (PRALUENT) 75 MG/ML SOPN Inject 1 pen into the skin every 14 (fourteen) days. 2 pen 11  . aspirin EC 325 MG EC tablet Take 1 tablet (325 mg total) by mouth daily.    . clonazePAM (KLONOPIN) 0.5 MG tablet Take 0.5 mg by mouth at bedtime as needed for anxiety.     . Coenzyme Q10 200 MG capsule Take 200 mg by mouth daily.     Marland Kitchen esomeprazole (NEXIUM) 40 MG capsule Take 40 mg by mouth daily at 12 noon.    Marland Kitchen ibuprofen (ADVIL,MOTRIN) 200 MG tablet Take 400 mg by mouth every 6 (six) hours as needed for moderate pain.    Marland Kitchen loratadine (CLARITIN) 10 MG tablet Take 10 mg by mouth daily as needed for allergies.     Marland Kitchen meclizine (ANTIVERT) 25 MG tablet Take 1 tablet (25 mg total) by mouth 3 (three) times daily as needed for dizziness. 30 tablet 1  . ondansetron (ZOFRAN) 4 MG tablet Take 1 tablet (4 mg total) by mouth every 8 (eight) hours as needed for nausea or vomiting. 30 tablet 1  .  RESTASIS 0.05 % ophthalmic emulsion Place 1 drop into both eyes at bedtime.      No current facility-administered medications for this visit.     Allergies  Statins and Zetia [ezetimibe]  Electrocardiogram:  SR normal 9/15   09/21/14  SR rate 62  Normal ECG  06/30/16 SR rate 64 normal ECG  Assessment and Plan  CAD:  Stable with no angina and good activity level.  Continue medical Rx 3 years post CABG   ETT normal 10/08/15  CEA: no change in residual bruit Duplex 16/10/9609/14/18 40-59% LICA but low end of range Has f/u with [facility]   Chol;  On praluent   Labs from primary 06/01/16 LDL 84 with normal LFTls   BP:  HTN response to exercise on ETT  Home readings ok Fatigue with beta blocker   PSA:   Indicated PSA elevated last office visit with Dr Clelia CroftShaw  ? From bicycle riding improved when off the bike  Palpitations: doubt significant PAF or SVT Discussed 4 options. Observe , repeat ETT and see if severe stress shows Arrhythmia, Event monitor , or Apple watch with monitor. He does not want to be put on beta blocker as it has caused Fatigue in past. Suspect he will get an apple watch and symptoms will be better in Spring   Anxiety/Depression:  Worse currently in winter with seasonal affective disorder f/u primary Causing some somatization of symptoms   F/U with me in 6 months   Charlton HawsPeter Gideon Burstein

## 2017-12-31 ENCOUNTER — Ambulatory Visit: Payer: BLUE CROSS/BLUE SHIELD | Admitting: Cardiovascular Disease

## 2017-12-31 ENCOUNTER — Encounter: Payer: Self-pay | Admitting: Cardiovascular Disease

## 2017-12-31 VITALS — BP 118/78 | HR 69 | Ht 71.0 in | Wt 164.2 lb

## 2017-12-31 DIAGNOSIS — I25810 Atherosclerosis of coronary artery bypass graft(s) without angina pectoris: Secondary | ICD-10-CM | POA: Diagnosis not present

## 2017-12-31 DIAGNOSIS — E785 Hyperlipidemia, unspecified: Secondary | ICD-10-CM | POA: Diagnosis not present

## 2017-12-31 NOTE — Patient Instructions (Signed)

## 2018-05-01 ENCOUNTER — Telehealth: Payer: Self-pay | Admitting: Cardiovascular Disease

## 2018-05-01 NOTE — Telephone Encounter (Signed)
Called patient back. Patient has been having palpitations/irreguler rhythm when exercising. Patient stated he thinks he might be going into A.FIB, but this only last for about 2 minutes after he stops his exercise. Patient stated bicycling is the only time he gets to go out of his house or office lately. Patient feels like it might be anxiety with the current events that are going on around, Covid19 and social distancing. Patient stated he was wondering if he needed to go back on a beta blocker or does he need another stress test. Made patient a virtual visit with Dr. Eden Emms on Friday for him to evaluate patient. Patient agree to visit.  Virtual Visit Pre-Appointment Phone Call  Steps For Call:  1. Confirm consent - "In the setting of the current Covid19 crisis, you are scheduled for a (phone or video) visit with your provider on (date) at (time).  Just as we do with many in-office visits, in order for you to participate in this visit, we must obtain consent.  If you'd like, I can send this to your mychart (if signed up) or email for you to review.  Otherwise, I can obtain your verbal consent now.  All virtual visits are billed to your insurance company just like a normal visit would be.  By agreeing to a virtual visit, we'd like you to understand that the technology does not allow for your provider to perform an examination, and thus may limit your provider's ability to fully assess your condition.  Finally, though the technology is pretty good, we cannot assure that it will always work on either your or our end, and in the setting of a video visit, we may have to convert it to a phone-only visit.  In either situation, we cannot ensure that we have a secure connection.  Are you willing to proceed?"  2. Give patient instructions for WebEx download to smartphone as below if video visit  3. Advise patient to be prepared with any vital sign or heart rhythm information, their current medicines, and a piece  of paper and pen handy for any instructions they may receive the day of their visit  4. Inform patient they will receive a phone call 15 minutes prior to their appointment time (may be from unknown caller ID) so they should be prepared to answer  5. Confirm that appointment type is correct in Epic appointment notes (video vs telephone)    TELEPHONE CALL NOTE  MAYFORD BECKSTRAND has been deemed a candidate for a follow-up tele-health visit to limit community exposure during the Covid-19 pandemic. I spoke with the patient via phone to ensure availability of phone/video source, confirm preferred email & phone number, and discuss instructions and expectations.  I reminded ADHVAITH THOUSAND to be prepared with any vital sign and/or heart rhythm information that could potentially be obtained via home monitoring, at the time of his visit. I reminded KUNIO BRACKENS to expect a phone call at the time of his visit if his visit.  Did the patient verbally acknowledge consent to treatment? yes  Ethelda Chick, RN 05/01/2018 4:00 PM   DOWNLOADING THE WEBEX SOFTWARE TO SMARTPHONE  - If Apple, go to [employer] and type in WebEx in the search bar. Download [employer], the blue/green circle. The app is free but as with any other app downloads, their phone may require them to verify saved payment information or Apple password. The patient does NOT have to create an account.  -  If Android, ask patient to go to [employer] and type in [employer] in the search bar. Download [employer], the blue/green circle. The app is free but as with any other app downloads, their phone may require them to verify saved payment information or Android password. The patient does NOT have to create an account.   CONSENT FOR TELE-HEALTH VISIT - PLEASE REVIEW  I hereby voluntarily request, consent and authorize [facility] and its employed or contracted physicians, physician assistants, nurse practitioners or other  licensed health care professionals (the Practitioner), to provide me with telemedicine health care services (the "Services") as deemed necessary by the treating Practitioner. I acknowledge and consent to receive the Services by the Practitioner via telemedicine. I understand that the telemedicine visit will involve communicating with the Practitioner through live audiovisual communication technology and the disclosure of certain medical information by electronic transmission. I acknowledge that I have been given the opportunity to request an in-person assessment or other available alternative prior to the telemedicine visit and am voluntarily participating in the telemedicine visit.  I understand that I have the right to withhold or withdraw my consent to the use of telemedicine in the course of my care at any time, without affecting my right to future care or treatment, and that the Practitioner or I may terminate the telemedicine visit at any time. I understand that I have the right to inspect all information obtained and/or recorded in the course of the telemedicine visit and may receive copies of available information for a reasonable fee.  I understand that some of the potential risks of receiving the Services via telemedicine include:  Marland Kitchen Delay or interruption in medical evaluation due to technological equipment failure or disruption; . Information transmitted may not be sufficient (e.g. poor resolution of images) to allow for appropriate medical decision making by the Practitioner; and/or  . In rare instances, security protocols could fail, causing a breach of personal health information.  Furthermore, I acknowledge that it is my responsibility to provide information about my medical history, conditions and care that is complete and accurate to the best of my ability. I acknowledge that Practitioner's advice, recommendations, and/or decision may be based on factors not within their control, such as  incomplete or inaccurate data provided by me or distortions of diagnostic images or specimens that may result from electronic transmissions. I understand that the practice of medicine is not an exact science and that Practitioner makes no warranties or guarantees regarding treatment outcomes. I acknowledge that I will receive a copy of this consent concurrently upon execution via email to the email address I last provided but may also request a printed copy by calling the office of [facility].    I understand that my insurance will be billed for this visit.   I have read or had this consent read to me. . I understand the contents of this consent, which adequately explains the benefits and risks of the Services being provided via telemedicine.  . I have been provided ample opportunity to ask questions regarding this consent and the Services and have had my questions answered to my satisfaction. . I give my informed consent for the services to be provided through the use of telemedicine in my medical care  By participating in this telemedicine visit I agree to the above.

## 2018-05-01 NOTE — Telephone Encounter (Signed)
° ° °  Patient calling to report when bike riding, or high exertion he feels he may be in "afib"   Patient states he has never been diagnosed with afib, but feels he needs a stress test and a sooner appt with Dr Eden Emms No shortness of breath No chest pain  Requesting call from Dr Eden Emms

## 2018-05-02 NOTE — Progress Notes (Signed)
Virtual Visit via Video Note    Evaluation Performed:  Follow-up visit  This visit type was conducted due to national recommendations for restrictions regarding the COVID-19 Pandemic (e.g. social distancing).  This format is felt to be most appropriate for this patient at this time.  All issues noted in this document were discussed and addressed.  No physical exam was performed (except for noted visual exam findings with Video Visits).  Please refer to the patient's chart (MyChart message for video visits and phone note for telephone visits) for the patient's consent to telehealth for [facility].  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    09/30/13  Had R CEA for TIA despite preop dopplers only showing moderate stenosis   Has muscle weakness with multiple statins and it really effects his cyclingNow on praluent and followed in lipid clinic Giving himself shots   ETT done 10/08/15 normal except for HTN response to exercise  Has son Graduated  at [employer] trying to get into law school  Daughter graduated from  [employer] Daughter going to [employer]   He has seasonal affective disorder and is quite anxious about Covid19 virus  Having palpitations  When exercising ? PAF.  Occurs while biking lasting 2 minutes He was wondering if he needed to go back on beta blocker or have stress testing He indicates HR only 120 when he feels the fluttering  Does not endorse abrupt onset and offset No chest pain   The patient does not have symptoms concerning for COVID-19 infection (fever, chills, cough, or new shortness of breath).    Prior CV studies:   The following studies were reviewed today:  Carotid 12/13/16  Left 40-59% Right patent post CEA ETT 10/08/15 normal HTN response to exercise  Past Medical History:  Diagnosis Date  . Anxiety   . Atypical chest pain   . Carotid artery occlusion   . Familial hyperlipidemia    LDL 212 on 01/12/13  . GERD (gastroesophageal reflux disease)   . Heart burn   . Hyperlipidemia    statin intolerant  . SEMI (subendocardial myocardial infarction) [facility])    Past Surgical History:  Procedure Laterality Date  . CERVICAL SPINE SURGERY  approx 2011   C3,C4, C5 diskectomy  . CORONARY ARTERY BYPASS GRAFT N/A 01/15/2013   Procedure: CORONARY ARTERY BYPASS GRAFTING (CABG) TIMES FOUR USING LEFT INTERNAL MAMMARY ARTERY AND RIGHT SAPHENOUS LEG VEIN HARVESTED ENDOSCOPICALLY;  Surgeon: [name], MD;  Location: [facility] OR;  Service: Open Heart Surgery;  Laterality: N/A;  . ENDARTERECTOMY Right 09/30/2013   Procedure: Right Carotid Endarterectomy with Patch;  Surgeon: [name], MD;  Location: [facility] OR;  Service: Vascular;  Laterality: Right;  . HAND SURGERY    .  I&D EXTREMITY Left 07/21/2013   Procedure: IRRIGATION AND DEBRIDEMENT EXTREMITY;  Surgeon: [name], MD;  Location: [facility];  Service: Orthopedics;  Laterality: Left;  . INTRAOPERATIVE TRANSESOPHAGEAL ECHOCARDIOGRAM N/A 01/15/2013   Procedure: INTRAOPERATIVE TRANSESOPHAGEAL ECHOCARDIOGRAM;  Surgeon: [name], MD;  Location: [facility] OR;  Service: Open Heart Surgery;  Laterality: N/A;  . LEFT HEART CATH N/A 01/11/2013   Procedure: LEFT HEART CATH;   Surgeon: [name], MD;  Location: [facility] CATH LAB;  Service: Cardiovascular;  Laterality: N/A;  . TONSILLECTOMY       No outpatient medications have been marked as taking for the 05/03/18 encounter (Appointment) with [name], MD.     Allergies:   Statins and Zetia [ezetimibe]   Social History   Tobacco Use  . Smoking status: Never Smoker  . Smokeless tobacco: Never Used  Substance Use Topics  . Alcohol use: Yes    Alcohol/week: 0.0 standard drinks    Comment: occasional  . Drug use: No     Family Hx: The patient's family history includes Heart attack in his father; Heart disease in his father; Hyperlipidemia in his father; Hypertension in his father.  ROS:   Please see the history of present illness.     All other systems reviewed and are negative.   Labs/Other Tests and Data Reviewed:    Recent Labs: No results found for requested labs within last 8760 hours.   Recent Lipid Panel Lab Results  Component Value Date/Time   CHOL 137 09/17/2015 10:47 AM   CHOL 118 11/03/2013 12:11 PM   TRIG 110 09/17/2015 10:47 AM   TRIG 115 11/03/2013 12:11 PM   HDL 21 (L) 09/17/2015 10:47 AM   HDL 22 (L) 11/03/2013 12:11 PM   CHOLHDL 6.5 (H) 09/17/2015 10:47 AM   LDLCALC 94 09/17/2015 10:47 AM   LDLCALC 73 11/03/2013 12:11 PM    Wt Readings from Last 3 Encounters:  12/31/17 74.5 kg  06/27/17 74.6 kg  12/13/16 73.5 kg     Objective:    Vital Signs:  There were no vitals taken for this visit.   Well nourished, well developed male in no acute distress. Post right CEA no JVP Skin warm and dry No labored breathing  Post sternotimy Post right LE vein harvest   ASSESSMENT & PLAN:    1.  CAD/CABG 01/16/13  No angina normal ETT 2017 stable  2.  Post right CEA:  With moderate left stenosis 40-59% f/u duplex ordered for 3 months due to COVID 3. HLD on Praluent LDL 84 2018 needs f/u labs with primary  4. HTN:  Well controlled.  Continue current medications and  low sodium Dash type diet.   5. Palpitations:  ? PAF previous fatigue with beta blocker  Will mai 2 week Zip Patch to patient ans see if we can document arrhythmia Doubt PAF if HR only 120 as he is relatively young and not on AV nodal blocking drugs  6. 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  The importance of social distancing was discussed today.  Patient Risk:   After full review of this patient's clinical status, I feel that they are at least moderate risk at this time.  Time:  Today, I have spent 30 minutes with the patient with telehealth technology discussing CAD, Carotid disease, palpitations Rx for HTN, and HLD need for lab testing and his anxiety .     Medication Adjustments/Labs and Tests Ordered: Current medicines are reviewed at length with the patient today.  Concerns regarding medicines are outlined above.  Tests Ordered: No orders of the defined types were placed in this encounter.  Medication Changes: No orders of the defined types were placed in this encounter.   Disposition:  Follow up 6 months   Signed, [name], MD  05/02/2018 1:31 PM    [facility]

## 2018-05-02 NOTE — Telephone Encounter (Signed)
Follow up      Webex trial run for 05-03-18 appt was unsuccessful because patient does not have an email or mychart.  Patient did not want to do a trial webex run.  However, the nurse will call patient prior to appt to attempt to access another way.

## 2018-05-03 ENCOUNTER — Telehealth (INDEPENDENT_AMBULATORY_CARE_PROVIDER_SITE_OTHER): Payer: BLUE CROSS/BLUE SHIELD | Admitting: Cardiovascular Disease

## 2018-05-03 ENCOUNTER — Other Ambulatory Visit: Payer: Self-pay

## 2018-05-03 VITALS — BP 121/82 | HR 61 | Ht 71.0 in | Wt 162.0 lb

## 2018-05-03 DIAGNOSIS — R002 Palpitations: Secondary | ICD-10-CM | POA: Diagnosis not present

## 2018-05-03 MED ORDER — PROPRANOLOL HCL 10 MG PO TABS: 10.0000 mg | ORAL_TABLET | Freq: Every day | ORAL | 2 refills | Status: DC | PRN

## 2018-05-03 NOTE — Patient Instructions (Addendum)
Medication Instructions:  Your physician has recommended you make the following change in your medication:  Take Inderal 10 mg by mouth as needed for palpitations.  If you need a refill on your cardiac medications before your next appointment, please call your pharmacy.   Lab work:  If you have labs (blood work) drawn today and your tests are completely normal, you will receive your results only by: Marland Kitchen MyChart Message (if you have MyChart) OR . A paper copy in the mail If you have any lab test that is abnormal or we need to change your treatment, we will call you to review the results.  Testing/Procedures: Your physician has recommended that you wear an event monitor Zio Patch to be mailed to you. Event monitors are medical devices that record the heart's electrical activity. Doctors most often [place] these monitors to diagnose arrhythmias. Arrhythmias are problems with the speed or rhythm of the heartbeat. The monitor is a small, portable device. You can wear one while you do your normal daily activities. This is usually used to diagnose what is causing palpitations/syncope (passing out).  Follow-Up:  At [facility], you and your health needs are our priority.  As part of our continuing mission to provide you with exceptional heart care, we have created designated Provider Care Teams.  These Care Teams include your primary Cardiologist (physician) and Advanced Practice Providers (APPs -  Physician Assistants and Nurse Practitioners) who all work together to provide you with the care you need, when you need it. You will need a follow up appointment in 1 month virtual visit.  You may see [name], MD or one of the following Advanced Practice Providers on your designated Care Team:   Norma Fredrickson, NP Nada Boozer, NP . Georgie Chard, NP

## 2018-05-06 ENCOUNTER — Telehealth: Payer: Self-pay | Admitting: *Deleted

## 2018-05-06 NOTE — Telephone Encounter (Signed)
Called to inform patient I will enroll him with [employer] to ship a 14 day ZIO XT patcch long term (holter) monitor to his home per Dr. Eden Emms.  Briefly went through instructions and left office and ZIO telephone number if he has questions.

## 2018-05-10 ENCOUNTER — Other Ambulatory Visit (INDEPENDENT_AMBULATORY_CARE_PROVIDER_SITE_OTHER): Payer: BLUE CROSS/BLUE SHIELD

## 2018-05-10 DIAGNOSIS — R002 Palpitations: Secondary | ICD-10-CM | POA: Diagnosis not present

## 2018-05-27 DIAGNOSIS — R002 Palpitations: Secondary | ICD-10-CM | POA: Diagnosis not present

## 2018-05-28 ENCOUNTER — Other Ambulatory Visit: Payer: Self-pay

## 2018-06-03 ENCOUNTER — Telehealth: Payer: Self-pay

## 2018-06-03 DIAGNOSIS — I471 Supraventricular tachycardia: Secondary | ICD-10-CM

## 2018-06-03 NOTE — Telephone Encounter (Signed)
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

## 2018-06-03 NOTE — Telephone Encounter (Signed)
Called patient to let him know he needs referral to EP. Dr. Johney Frame agreed to see patient, referral placed. Will send message to his nurse and scheduler.

## 2018-06-05 NOTE — Telephone Encounter (Signed)
Pt has appt with Dr. Ladona Ridgel scheduled. No further action needed.

## 2018-06-13 ENCOUNTER — Other Ambulatory Visit: Payer: Self-pay

## 2018-06-13 ENCOUNTER — Telehealth: Payer: BLUE CROSS/BLUE SHIELD | Admitting: Internal Medicine

## 2018-06-13 ENCOUNTER — Telehealth: Payer: Self-pay | Admitting: Internal Medicine

## 2018-06-13 NOTE — Progress Notes (Incomplete)
Electrophysiology TeleHealth Note   Due to national recommendations of social distancing due to COVID 19, an audio/video telehealth visit is felt to be most appropriate for this patient at this time.  See MyChart message from today for the patient's consent to telehealth for [facility].   Date:  06/13/2018   ID:  Philis PiqueJohn S Kazlauskas, DOB 04/08/1960, MRN [id number]  Location: patient's home  Provider location: [address], [place]  Evaluation Performed: Follow-up visit  PCP:  Lupita RaiderShaw, Kimberlee, MD  Cardiologist:  Charlton HawsPeter Nishan, MD *** Electrophysiologist:  Dr Johney FrameAllred  Chief Complaint:  ***  History of Present Illness:    Philis PiqueJohn S Keenum is a 58 y.o. male who presents via audio/video conferencing for a telehealth visit today.  Since last being seen in our clinic, the patient reports doing very well.  Today, he denies symptoms of palpitations, chest pain, shortness of breath,  lower extremity edema, dizziness, presyncope, or syncope.  The patient is otherwise without complaint today.  The patient denies symptoms of fevers, chills, cough, or new SOB worrisome for COVID 19.  Past Medical History:  Diagnosis Date  . Anxiety   . Atypical chest pain   . Carotid artery occlusion   . Familial hyperlipidemia    LDL 212 on 01/12/13  . GERD (gastroesophageal reflux disease)   . Heart burn   . Hyperlipidemia    statin intolerant  . SEMI (subendocardial myocardial infarction) [facility])     Past Surgical History:  Procedure Laterality Date  . CERVICAL SPINE SURGERY  approx 2011   C3,C4, C5 diskectomy  . CORONARY ARTERY BYPASS GRAFT N/A 01/15/2013   Procedure: CORONARY ARTERY BYPASS GRAFTING (CABG) TIMES FOUR USING LEFT INTERNAL MAMMARY ARTERY AND RIGHT SAPHENOUS LEG VEIN HARVESTED ENDOSCOPICALLY;  Surgeon: Delight OvensEdward B Gerhardt, MD;  [facility];  Service: Open Heart Surgery;  Laterality: N/A;  . ENDARTERECTOMY Right 09/30/2013   Procedure: Right Carotid Endarterectomy with Patch;   Surgeon: Chuck Hinthristopher S Dickson, MD;  [facility];  Service: Vascular;  Laterality: Right;  . HAND SURGERY    . I&D EXTREMITY Left 07/21/2013   Procedure: IRRIGATION AND DEBRIDEMENT EXTREMITY;  Surgeon: Nicki ReaperGary R [name], MD;  Location: [facility];  Service: Orthopedics;  Laterality: Left;  . INTRAOPERATIVE TRANSESOPHAGEAL ECHOCARDIOGRAM N/A 01/15/2013   Procedure: INTRAOPERATIVE TRANSESOPHAGEAL ECHOCARDIOGRAM;  Surgeon: Delight OvensEdward B Gerhardt, MD;  [facility];  Service: Open Heart Surgery;  Laterality: N/A;  . LEFT HEART CATH N/A 01/11/2013   Procedure: LEFT HEART CATH;  Surgeon: Kathleene Hazelhristopher D McAlhany, MD;  [facility];  Service: Cardiovascular;  Laterality: N/A;  . TONSILLECTOMY      Current Outpatient Medications  Medication Sig Dispense Refill  . acyclovir (ZOVIRAX) 200 MG capsule Take 400 mg by mouth at bedtime.    . Alirocumab (PRALUENT) 75 MG/ML SOPN Inject 1 pen into the skin every 14 (fourteen) days. 2 pen 11  . aspirin 325 MG tablet Take 162.5 mg by mouth daily.    . clonazePAM (KLONOPIN) 0.5 MG tablet Take 0.5 mg by mouth at bedtime as needed for anxiety.     . Coenzyme Q10 200 MG capsule Take 200 mg by mouth daily.     Marland Kitchen. esomeprazole (NEXIUM) 40 MG capsule Take 40 mg by mouth daily at 12 noon.    Marland Kitchen. ibuprofen (ADVIL,MOTRIN) 200 MG tablet Take 400 mg by mouth every 6 (six) hours as needed for moderate pain.    Marland Kitchen. propranolol (INDERAL) 10  MG tablet Take 1 tablet (10 mg total) by mouth daily as needed (palpitations). 30 tablet 2  . RESTASIS 0.05 % ophthalmic emulsion Place 1 drop into both eyes at bedtime.      No current facility-administered medications for this visit.     Allergies:   Statins and Zetia [ezetimibe]   Social History:  The patient  reports that he has never smoked. He has never used smokeless tobacco. He reports current alcohol use. He reports that he does not use drugs.   Family History:  The patient's *** family history includes Heart  attack in his father; Heart disease in his father; Hyperlipidemia in his father; Hypertension in his father.   ROS:  Please see the history of present illness.   All other systems are personally reviewed and negative.    Exam:    Vital Signs:  There were no vitals taken for this visit.  Well appearing, alert and conversant, regular work of breathing,  good skin color Eyes- anicteric, neuro- grossly intact, skin- no apparent rash or lesions or cyanosis, mouth- oral mucosa is pink   Labs/Other Tests and Data Reviewed:    Recent Labs: No results found for requested labs within last 8760 hours.   Wt Readings from Last 3 Encounters:  05/03/18 162 lb (73.5 kg)  12/31/17 164 lb 4 oz (74.5 kg)  06/27/17 164 lb 8 oz (74.6 kg)     Other studies personally reviewed: Additional studies/ records that were reviewed today include: ***  Review of the above records today demonstrates: as above Prior radiographs: ***  The patient presents wearable device technology report for my review today. On my review, the patient presents with Apple Watch tracings from *** . The tracings reveal ***  Last device remote is reviewed from [employer] PDF dated *** which reveals normal device function, no arrhythmias ***   ASSESSMENT & PLAN:    1.  ***   COVID 19 screen The patient denies symptoms of COVID 19 at this time.  The importance of social distancing was discussed today.  Follow-up:  *** Next remote: ***  Current medicines are reviewed at length with the patient today.   The patient {ACTIONS; HAS/DOES NOT HAVE:19233} concerns regarding his medicines.  The following changes were made today:  {NONE DEFAULTED:18576::"none"}  Labs/ tests ordered today include: *** No orders of the defined types were placed in this encounter.    Patient Risk:  after full review of this patients clinical status, I feel that they are at moderate risk at this time.  Today, I have spent *** minutes with the patient  with telehealth technology discussing *** .    Signed, Lewayne Bunting, MD  06/13/2018 1:58 PM     [facility] [address] [place] [address] [phone] (office) [phone] (fax)

## 2018-06-13 NOTE — Telephone Encounter (Signed)
New message   [facility] for pt to call back about appt today at 2pm. Need to confirm if pt has a smart phone to do video visit and get verbal consent from pt. When pt calls back, please reach out via secure chat I will speak to pt.

## 2018-06-19 ENCOUNTER — Other Ambulatory Visit: Payer: Self-pay

## 2018-06-19 ENCOUNTER — Telehealth (INDEPENDENT_AMBULATORY_CARE_PROVIDER_SITE_OTHER): Payer: BLUE CROSS/BLUE SHIELD | Admitting: Internal Medicine

## 2018-06-19 DIAGNOSIS — R002 Palpitations: Secondary | ICD-10-CM | POA: Diagnosis not present

## 2018-06-19 MED ORDER — METOPROLOL SUCCINATE ER 25 MG PO TB24: ORAL_TABLET | ORAL | 11 refills | Status: DC

## 2018-06-19 NOTE — Progress Notes (Signed)
Electrophysiology TeleHealth Note   Due to national recommendations of social distancing due to COVID 19, an audio/video telehealth visit is felt to be most appropriate for this patient at this time.  See MyChart message from today for the patient's consent to telehealth for [facility].   Date:  06/19/2018   ID:  Andrew Sampson, DOB Nov 01, 1960, MRN [id number]  Location: patient's home  Provider location: [address] [place]  Evaluation Performed: Follow-up visit  PCP:  [name], MD  Cardiologist:  [name], MD  Electrophysiologist:  Dr Ladona Ridgel  Chief Complaint:  "I feel like there is a flap in my heart."  History of Present Illness:    Andrew Sampson is a 58 y.o. male who presents via audio/video conferencing for a telehealth visit today.  He is a pleasant 58 yo man with CAD, s/p CABG, who is an avid mountain bike rider. Over the past month or two, he notes that when he is on his bike, he feels like his heart starts racing. It has not occurred when he is at risk although he does experience some palpitations. He denies anginal symptoms. When he is on his bike it feels like he suddenly gets sob. The eventually stops. The episodes start and stop suddenly. He wore a cardiac monitor which demonstrates PVC's and PAC's but possible SVT. He also clearly has sinus tachy as well as noise artifact making interpretation more difficult. It appears that he is also having a short RP tachy although the initiation sequence is not particularly obvious. Specifically, I do not see a PAC with a long PR resulting in the SVT. He clearly feels like his heart is racing although this only occurs when he is on his bike. When he wore the cardiac monitor, I did not appreciate any episodes occurring when he was not on his bike.  Today, he denies symptoms of chest pain, shortness of breath,  lower extremity edema, dizziness, presyncope, or syncope.  The patient is otherwise without complaint  today.  The patient denies symptoms of fevers, chills, cough, or new SOB worrisome for COVID 19.  Past Medical History:  Diagnosis Date   Anxiety    Atypical chest pain    Carotid artery occlusion    Familial hyperlipidemia    LDL 212 on 01/12/13   GERD (gastroesophageal reflux disease)    Heart burn    Hyperlipidemia    statin intolerant   SEMI (subendocardial myocardial infarction) [facility])     Past Surgical History:  Procedure Laterality Date   CERVICAL SPINE SURGERY  approx 2011   C3,C4, C5 diskectomy   CORONARY ARTERY BYPASS GRAFT N/A 01/15/2013   Procedure: CORONARY ARTERY BYPASS GRAFTING (CABG) TIMES FOUR USING LEFT INTERNAL MAMMARY ARTERY AND RIGHT SAPHENOUS LEG VEIN HARVESTED ENDOSCOPICALLY;  Surgeon: [name], MD;  Location: [facility] OR;  Service: Open Heart Surgery;  Laterality: N/A;   ENDARTERECTOMY Right 09/30/2013   Procedure: Right Carotid Endarterectomy with Patch;  Surgeon: [name], MD;  Location: [facility] OR;  Service: Vascular;  Laterality: Right;   HAND SURGERY     I&D EXTREMITY Left 07/21/2013   Procedure: IRRIGATION AND DEBRIDEMENT EXTREMITY;  Surgeon: [name], MD;  Location: [facility];  Service: Orthopedics;  Laterality: Left;   INTRAOPERATIVE TRANSESOPHAGEAL ECHOCARDIOGRAM N/A 01/15/2013   Procedure: INTRAOPERATIVE TRANSESOPHAGEAL ECHOCARDIOGRAM;  Surgeon: [name], MD;  Location: [facility] OR;  Service: Open Heart Surgery;  Laterality: N/A;   LEFT  HEART CATH N/A 01/11/2013   Procedure: LEFT HEART CATH;  Surgeon: Kathleene Hazelhristopher D McAlhany, MD;  [facility];  Service: Cardiovascular;  Laterality: N/A;   TONSILLECTOMY      Current Outpatient Medications  Medication Sig Dispense Refill   acyclovir (ZOVIRAX) 200 MG capsule Take 400 mg by mouth at bedtime.     Alirocumab (PRALUENT) 75 MG/ML SOPN Inject 1 pen into the skin every 14 (fourteen) days. 2 pen 11   aspirin 325 MG tablet Take 162.5 mg by mouth  daily.     clonazePAM (KLONOPIN) 0.5 MG tablet Take 0.5 mg by mouth at bedtime as needed for anxiety.      Coenzyme Q10 200 MG capsule Take 200 mg by mouth daily.      esomeprazole (NEXIUM) 40 MG capsule Take 40 mg by mouth daily at 12 noon.     ibuprofen (ADVIL,MOTRIN) 200 MG tablet Take 400 mg by mouth every 6 (six) hours as needed for moderate pain.     propranolol (INDERAL) 10 MG tablet Take 1 tablet (10 mg total) by mouth daily as needed (palpitations). 30 tablet 2   RESTASIS 0.05 % ophthalmic emulsion Place 1 drop into both eyes at bedtime.      No current facility-administered medications for this visit.     Allergies:   Statins and Zetia [ezetimibe]   Social History:  The patient  reports that he has never smoked. He has never used smokeless tobacco. He reports current alcohol use. He reports that he does not use drugs.   Family History:  The patient's  family history includes Heart attack in his father; Heart disease in his father; Hyperlipidemia in his father; Hypertension in his father.   ROS:  Please see the history of present illness.   All other systems are personally reviewed and negative.    Exam:    Vital Signs:  There were no vitals taken for this visit.  Well appearing, alert and conversant, regular work of breathing,  good skin color Eyes- anicteric, neuro- grossly intact, skin- no apparent rash or lesions or cyanosis, mouth- oral mucosa is pink   Labs/Other Tests and Data Reviewed:    Recent Labs: No results found for requested labs within last 8760 hours.   Wt Readings from Last 3 Encounters:  05/03/18 162 lb (73.5 kg)  12/31/17 164 lb 4 oz (74.5 kg)  06/27/17 164 lb 8 oz (74.6 kg)     Other studies personally reviewed: Additional studies/ records that were reviewed today include:   Cardiac monitor   ASSESSMENT & PLAN:    1.  Palpitations - it is still unclear to me as to whether or not he is experiencing SVT. I have recommended a trial of  long acting beta blockers. He does have PVC' and PAC's and I am sure that this is playing a role. I offered him EP study and catheter ablation if he has inducible VT. For now he will try a low dose long acting beta blocker. If his symptoms worsen, then we could consider catheter ablation.  2. CAD - when his heart is fast, he does not have anginal symptoms.  3. COVID 19 screen The patient denies symptoms of COVID 19 at this time.  The importance of social distancing was discussed today.  Follow-up:  3 months Next remote: n/a  Current medicines are reviewed at length with the patient today.   The patient does not have concerns regarding his medicines.  The following changes were  made today:  none  Labs/ tests ordered today include: none No orders of the defined types were placed in this encounter.    Patient Risk:  after full review of this patients clinical status, I feel that they are at moderate risk at this time.  Today, I have spent 35 minutes with the patient with telehealth technology discussing all of the above. Leonides Schanz, Lewayne Bunting, MD  06/19/2018 3:01 PM     [facility] [address] [place] [address] [phone] (office) [phone] (fax)

## 2018-06-19 NOTE — Addendum Note (Signed)
Addended by: Roney Mans A on: 06/19/2018 04:39 PM   Modules accepted: Orders

## 2018-07-03 ENCOUNTER — Telehealth: Payer: BLUE CROSS/BLUE SHIELD | Admitting: Cardiovascular Disease

## 2018-08-08 ENCOUNTER — Other Ambulatory Visit: Payer: Self-pay | Admitting: Pharmacist

## 2018-08-08 ENCOUNTER — Telehealth: Payer: Self-pay | Admitting: Pharmacist

## 2018-08-08 MED ORDER — PRALUENT 150 MG/ML ~~LOC~~ SOAJ: 1.0000 "pen " | SUBCUTANEOUS | 11 refills | Status: DC

## 2018-08-08 NOTE — Telephone Encounter (Signed)
Prior authorization renewal approved for Praluent - higher dose of 150mg  since most recent LDL from [facility] 06/19/17 was 100 above goal < 70. Left message for pt to advise him of Praluent dose increase.

## 2018-08-09 DIAGNOSIS — H5213 Myopia, bilateral: Secondary | ICD-10-CM | POA: Diagnosis not present

## 2018-08-12 NOTE — Telephone Encounter (Signed)
Left message for pt

## 2018-08-14 DIAGNOSIS — R42 Dizziness and giddiness: Secondary | ICD-10-CM | POA: Diagnosis not present

## 2018-08-28 DIAGNOSIS — R42 Dizziness and giddiness: Secondary | ICD-10-CM | POA: Diagnosis not present

## 2018-09-26 ENCOUNTER — Other Ambulatory Visit: Payer: Self-pay

## 2018-09-26 DIAGNOSIS — Z20822 Contact with and (suspected) exposure to covid-19: Secondary | ICD-10-CM

## 2018-09-28 LAB — NOVEL CORONAVIRUS, NAA: SARS-CoV-2, NAA: NOT DETECTED

## 2018-10-18 ENCOUNTER — Telehealth: Payer: Self-pay | Admitting: Cardiovascular Disease

## 2018-10-18 MED ORDER — PRALUENT 75 MG/ML ~~LOC~~ SOAJ: 1.0000 "pen " | SUBCUTANEOUS | 11 refills | Status: DC

## 2018-10-18 NOTE — Telephone Encounter (Signed)
Praluent had been increase to 150mg  back in July due to labs in May 2019 being above goal. We attempted to get ahold of patient but was never able to.  Patient is concerned about increasing the dose. Advised since his labs were over a year old now, that we could wait to increase until he has repeat labs and sees Dr. Johnsie Cancel. Patient would like to do this. He also requests that he get medication filled at [facility] if possible. Doesn't like [facility]. I have sent Rx over to [facility] to see if they can fill.  Called [facility] who states they had not processed Rx yet. They stated they would call patient once they process it. Called patient and left a VM per his request that [facility] would be calling him about his prescription.

## 2018-10-18 NOTE — Telephone Encounter (Signed)
New message   Pt c/o medication issue:  1. Name of Medication:   Alirocumab (PRALUENT) 150 MG/ML SOAJ     2. How are you currently taking this medication (dosage and times per day)? Injection once every other week  3. Are you having a reaction (difficulty breathing--STAT)? n/a  4. What is your medication issue? Patient states that there is a discrepancy in the dosage. Please call to discuss.

## 2018-10-22 NOTE — Telephone Encounter (Signed)
Confirmed with [facility] that they were able to fill Praluent for patient. Medication is now in stock. Patient just needs to bring his copay card in. Left VM on patient phone with the above info

## 2018-10-29 ENCOUNTER — Encounter: Payer: Self-pay | Admitting: Internal Medicine

## 2018-10-29 ENCOUNTER — Other Ambulatory Visit: Payer: Self-pay

## 2018-10-29 ENCOUNTER — Ambulatory Visit: Payer: BC Managed Care – PPO | Admitting: Internal Medicine

## 2018-10-29 DIAGNOSIS — R002 Palpitations: Secondary | ICD-10-CM

## 2018-10-29 HISTORY — DX: Palpitations: R00.2

## 2018-10-29 NOTE — Patient Instructions (Addendum)
Medication Instructions:  Your physician recommends that you continue on your current medications as directed. Please refer to the Current Medication list given to you today.  Labwork: None ordered.  Testing/Procedures:  COVID TEST--November 22, 2018 at 12:00 pm-- You will go to the old [facility] hospital ([place]) for your Covid testing.   This is a drive thru test site.  There will be multiple testing areas.  Be sure to share with the first checkpoint that you are there for pre-procedure/surgery testing. This will put you into the right (yellow) lane that leads to the PAT testing team Stay in your car and the nurse team will come to your car to test you.  After you are tested please go home and self quarantine until the day of your procedure.    Your physician has requested that you have an exercise tolerance test. For further information please visit [email or web address]. Please also follow instruction sheet, as given.  You will have a stress test at [facility] office November 25, 2018 at 12:00 pm with Dr. [name].    Follow-Up: Your physician wants you to follow-up based on results of stress test.  Any Other Special Instructions Will Be Listed Below (If Applicable).  If you need a refill on your cardiac medications before your next appointment, please call your pharmacy.

## 2018-10-29 NOTE — Progress Notes (Addendum)
HPI Andrew Sampson returns today for followup. He is a pleasant 58 yo man with a h/o palpitations. He has a h/o CAD. He wore a cardiac monitor which demonstrated PAC's and PVC's. He continues to ride a mountain bike. He notes that when he works out and is on his bike and as he goes up a hill he feels like his heart is racing and he thinks that he is in SVT. When he wore a cardiac monitor, he clearly had PVC's and PAC's but it was unclear that he had SVT.  Allergies  Allergen Reactions  . Statins     Simvastatin (myalgias), Crestor (myalgias), Lipitor (with PCP in past had myalgias - tolerating this well currently)  . Zetia [Ezetimibe]     Muscle aches     Current Outpatient Medications  Medication Sig Dispense Refill  . acyclovir (ZOVIRAX) 200 MG capsule Take 400 mg by mouth at bedtime.    . Alirocumab (PRALUENT) 75 MG/ML SOAJ Inject 1 pen into the skin every 14 (fourteen) days. 2 pen 11  . aspirin 325 MG tablet Take 162.5 mg by mouth daily.    . clonazePAM (KLONOPIN) 0.5 MG tablet Take 0.5 mg by mouth at bedtime as needed for anxiety.     . Coenzyme Q10 200 MG capsule Take 200 mg by mouth daily.     Marland Kitchen esomeprazole (NEXIUM) 40 MG capsule Take 40 mg by mouth daily at 12 noon.    Marland Kitchen ibuprofen (ADVIL,MOTRIN) 200 MG tablet Take 400 mg by mouth every 6 (six) hours as needed for moderate pain.    . metoprolol succinate (TOPROL XL) 25 MG 24 hr tablet Take one tablet two hours prior to exercise as needed for palpitations. 30 tablet 11  . propranolol (INDERAL) 10 MG tablet Take 1 tablet (10 mg total) by mouth daily as needed (palpitations). 30 tablet 2  . RESTASIS 0.05 % ophthalmic emulsion Place 1 drop into both eyes at bedtime.      No current facility-administered medications for this visit.      Past Medical History:  Diagnosis Date  . Anxiety   . Atypical chest pain   . Carotid artery occlusion   . Familial hyperlipidemia    LDL 212 on 01/12/13  . GERD (gastroesophageal reflux  disease)   . Heart burn   . Hyperlipidemia    statin intolerant  . SEMI (subendocardial myocardial infarction) ([place])     ROS:   All systems reviewed and negative except as noted in the HPI.   Past Surgical History:  Procedure Laterality Date  . CERVICAL SPINE SURGERY  approx 2011   C3,C4, C5 diskectomy  . CORONARY ARTERY BYPASS GRAFT N/A 01/15/2013   Procedure: CORONARY ARTERY BYPASS GRAFTING (CABG) TIMES FOUR USING LEFT INTERNAL MAMMARY ARTERY AND RIGHT SAPHENOUS LEG VEIN HARVESTED ENDOSCOPICALLY;  Surgeon: Grace Isaac, MD;  Location: [place];  Service: Open Heart Surgery;  Laterality: N/A;  . ENDARTERECTOMY Right 09/30/2013   Procedure: Right Carotid Endarterectomy with Patch;  Surgeon: Angelia Mould, MD;  Location: [place];  Service: Vascular;  Laterality: Right;  . HAND SURGERY    . I&D EXTREMITY Left 07/21/2013   Procedure: IRRIGATION AND DEBRIDEMENT EXTREMITY;  Surgeon: Wynonia Sours, MD;  Location: [place];  Service: Orthopedics;  Laterality: Left;  . INTRAOPERATIVE TRANSESOPHAGEAL ECHOCARDIOGRAM N/A 01/15/2013   Procedure: INTRAOPERATIVE TRANSESOPHAGEAL ECHOCARDIOGRAM;  Surgeon: Grace Isaac, MD;  Location: [place];  Service: Open Heart Surgery;  Laterality: N/A;  . LEFT HEART CATH N/A 01/11/2013   Procedure: LEFT HEART CATH;  Surgeon: [name], MD;  Location: [facility] CATH LAB;  Service: Cardiovascular;  Laterality: N/A;  . TONSILLECTOMY       Family History  Problem Relation Age of Onset  . Heart attack Father   . Heart disease Father   . Hypertension Father   . Hyperlipidemia Father      Social History   Socioeconomic History  . Marital status: Married    Spouse name: Not on file  . Number of children: Not on file  . Years of education: Not on file  . Highest education level: Not on file  Occupational History  . Not on file  Social Needs  . Financial resource strain: Not on file  . Food insecurity    Worry: Not on file     Inability: Not on file  . Transportation needs    Medical: Not on file    Non-medical: Not on file  Tobacco Use  . Smoking status: Never Smoker  . Smokeless tobacco: Never Used  Substance and Sexual Activity  . Alcohol use: Yes    Alcohol/week: 0.0 standard drinks    Comment: occasional  . Drug use: No  . Sexual activity: Not on file  Lifestyle  . Physical activity    Days per week: Not on file    Minutes per session: Not on file  . Stress: Not on file  Relationships  . Social Musician on phone: Not on file    Gets together: Not on file    Attends religious service: Not on file    Active member of club or organization: Not on file    Attends meetings of clubs or organizations: Not on file    Relationship status: Not on file  . Intimate partner violence    Fear of current or ex partner: Not on file    Emotionally abused: Not on file    Physically abused: Not on file    Forced sexual activity: Not on file  Other Topics Concern  . Not on file  Social History Narrative  . Not on file     BP 132/86   Pulse (!) 59   Ht 5\' 11"  (1.803 m)   Wt 160 lb (72.6 kg)   SpO2 97%   BMI 22.32 kg/m   Physical Exam:  Well appearing NAD HEENT: Unremarkable Neck:  No JVD, no thyromegally Lymphatics:  No adenopathy Back:  No CVA tenderness Lungs:  Clear with no wheezes HEART:  Regular rate rhythm, no murmurs, no rubs, no clicks Abd:  soft, positive bowel sounds, no organomegally, no rebound, no guarding Ext:  2 plus pulses, no edema, no cyanosis, no clubbing Skin:  No rashes no nodules Neuro:  CN II through XII intact, motor grossly intact  EKG - nsr   Assess/Plan: 1. Palpitations :-the mechanism of his palpitations is unclear. The patient notes that when he gets to the top of the hill, he will feel like his heart is racing. It is unclear if he is really going into SVT. I suspect that when he gets to a very high level of work, he is getting acidemic. I have  recommended he undergo exercise testing. Hopefully we can see him go into SVT while he is walking. I would hold off on an EP study until we have more information.  2. HTN - his bp is elevated.    Taylor,M.D.

## 2018-11-08 DIAGNOSIS — Z125 Encounter for screening for malignant neoplasm of prostate: Secondary | ICD-10-CM | POA: Diagnosis not present

## 2018-11-08 DIAGNOSIS — E7800 Pure hypercholesterolemia, unspecified: Secondary | ICD-10-CM | POA: Diagnosis not present

## 2018-11-08 DIAGNOSIS — R7301 Impaired fasting glucose: Secondary | ICD-10-CM | POA: Diagnosis not present

## 2018-11-12 DIAGNOSIS — R42 Dizziness and giddiness: Secondary | ICD-10-CM | POA: Diagnosis not present

## 2018-11-13 ENCOUNTER — Other Ambulatory Visit: Payer: Self-pay | Admitting: Family Medicine

## 2018-11-13 ENCOUNTER — Ambulatory Visit
Admission: RE | Admit: 2018-11-13 | Discharge: 2018-11-13 | Disposition: A | Discharge status: Home / Self Care | Payer: BC Managed Care – PPO | Source: Ambulatory Visit | Attending: Family Medicine | Admitting: Family Medicine

## 2018-11-13 DIAGNOSIS — R0600 Dyspnea, unspecified: Secondary | ICD-10-CM

## 2018-11-13 DIAGNOSIS — Z0000 Encounter for general adult medical examination without abnormal findings: Secondary | ICD-10-CM | POA: Diagnosis not present

## 2018-11-13 DIAGNOSIS — R0602 Shortness of breath: Secondary | ICD-10-CM | POA: Diagnosis not present

## 2018-11-20 ENCOUNTER — Telehealth: Payer: Self-pay | Admitting: Pharmacist

## 2018-11-20 ENCOUNTER — Telehealth (HOSPITAL_COMMUNITY): Payer: Self-pay | Admitting: *Deleted

## 2018-11-20 MED ORDER — PRALUENT 150 MG/ML ~~LOC~~ SOAJ: 1.0000 "pen " | SUBCUTANEOUS | 11 refills | Status: DC

## 2018-11-20 NOTE — Telephone Encounter (Signed)
Patient given detailed instructions per [facility] Information Sheet for the test on 11/25/2018 at 1145. Patient notified to arrive 15 minutes early and that it is imperative to arrive on time for appointment to keep from having the test rescheduled.  If you need to cancel or reschedule your appointment, please call the office within 24 hours of your appointment. . Patient verbalized understanding.Andrew Sampson, Ranae Palms

## 2018-11-20 NOTE — Telephone Encounter (Signed)
Pt called clinic inquiring about a dose increase of Praluent, states he had been speaking with Melissa. He is asking for new rx to be sent to local pharmacy since his PCP checked his lipids and agreed that his Praluent dose should be increased. He also mentioned he initially picked up a 150mg  and then it changed back to 75mg .   I have sent an rx to his pharmacy but will forward to [facility] as well for follow up - I don't see any recent documentation about this and I'm not clear on recent events or conversations with pt.

## 2018-11-21 NOTE — Telephone Encounter (Signed)
See phone note from 10/18/2018 for more details. Will recheck labs in 12 weeks. PA for 150mg  dose submitted today

## 2018-11-22 ENCOUNTER — Other Ambulatory Visit (HOSPITAL_COMMUNITY)
Admission: RE | Admit: 2018-11-22 | Discharge: 2018-11-22 | Disposition: A | Discharge status: Home / Self Care | Payer: BC Managed Care – PPO | Source: Ambulatory Visit | Attending: Internal Medicine | Admitting: Internal Medicine

## 2018-11-22 DIAGNOSIS — Z01812 Encounter for preprocedural laboratory examination: Secondary | ICD-10-CM | POA: Diagnosis not present

## 2018-11-22 DIAGNOSIS — Z20828 Contact with and (suspected) exposure to other viral communicable diseases: Secondary | ICD-10-CM | POA: Diagnosis not present

## 2018-11-23 LAB — NOVEL CORONAVIRUS, NAA (HOSP ORDER, SEND-OUT TO REF LAB; TAT 18-24 HRS): SARS-CoV-2, NAA: NOT DETECTED

## 2018-11-25 ENCOUNTER — Encounter: Payer: Self-pay | Admitting: Cardiovascular Disease

## 2018-11-25 ENCOUNTER — Other Ambulatory Visit: Payer: Self-pay

## 2018-11-25 ENCOUNTER — Ambulatory Visit (HOSPITAL_COMMUNITY): Payer: BC Managed Care – PPO | Attending: Cardiovascular Disease

## 2018-11-25 DIAGNOSIS — R002 Palpitations: Secondary | ICD-10-CM | POA: Insufficient documentation

## 2018-11-28 LAB — EXERCISE TOLERANCE TEST
Estimated workload: 11.7 METS
Exercise duration (min): 10 min
Exercise duration (sec): 1 s
MPHR: 161 {beats}/min
Peak HR: 169 {beats}/min
Percent HR: 105 %
RPE: 19
Rest HR: 72 {beats}/min

## 2018-12-03 ENCOUNTER — Ambulatory Visit: Payer: BLUE CROSS/BLUE SHIELD | Admitting: Cardiovascular Disease

## 2018-12-06 ENCOUNTER — Encounter: Payer: Self-pay | Admitting: Internal Medicine

## 2019-01-21 ENCOUNTER — Other Ambulatory Visit: Payer: Self-pay

## 2019-01-21 ENCOUNTER — Encounter: Payer: Self-pay | Admitting: Internal Medicine

## 2019-01-21 ENCOUNTER — Ambulatory Visit (INDEPENDENT_AMBULATORY_CARE_PROVIDER_SITE_OTHER): Payer: BC Managed Care – PPO | Admitting: Internal Medicine

## 2019-01-21 VITALS — BP 122/70 | HR 75 | Ht 71.0 in | Wt 162.0 lb

## 2019-01-21 DIAGNOSIS — R002 Palpitations: Secondary | ICD-10-CM

## 2019-01-21 NOTE — Patient Instructions (Addendum)

## 2019-01-21 NOTE — Progress Notes (Signed)
HPI  [name] returns today for followup of SVT. He is a pleasant middle aged man with CAD, s/p CABG who returns today for followup of his SVT. He is an active mountain biker and has episodes of palpitations who underwent exercise testing and had no arrhythmias. He has done fairly well in the interim. He denies anginal symptoms.  Allergies  Allergen Reactions  . Statins     Simvastatin (myalgias), Crestor (myalgias), Lipitor (with PCP in past had myalgias - tolerating this well currently)  . Zetia [Ezetimibe]     Muscle aches     Current Outpatient Medications  Medication Sig Dispense Refill  . acyclovir (ZOVIRAX) 200 MG capsule Take 400 mg by mouth at bedtime.    . Alirocumab (PRALUENT) 150 MG/ML SOAJ Inject 1 pen into the skin every 14 (fourteen) days. 2 pen 11  . aspirin 325 MG tablet Take 162.5 mg by mouth daily.    . clonazePAM (KLONOPIN) 0.5 MG tablet Take 0.5 mg by mouth at bedtime as needed for anxiety.     . Coenzyme Q10 200 MG capsule Take 200 mg by mouth daily.     Marland Kitchen esomeprazole (NEXIUM) 40 MG capsule Take 40 mg by mouth daily at 12 noon.    Marland Kitchen ibuprofen (ADVIL,MOTRIN) 200 MG tablet Take 400 mg by mouth every 6 (six) hours as needed for moderate pain.    . metoprolol succinate (TOPROL XL) 25 MG 24 hr tablet Take one tablet two hours prior to exercise as needed for palpitations. 30 tablet 11  . propranolol (INDERAL) 10 MG tablet Take 1 tablet (10 mg total) by mouth daily as needed (palpitations). 30 tablet 2  . RESTASIS 0.05 % ophthalmic emulsion Place 1 drop into both eyes at bedtime.      No current facility-administered medications for this visit.     Past Medical History:  Diagnosis Date  . Anxiety   . Atypical chest pain   . Carotid artery occlusion   . Familial hyperlipidemia    LDL 212 on 01/12/13  . GERD (gastroesophageal reflux disease)   . Heart burn   . Hyperlipidemia    statin intolerant  . SEMI (subendocardial myocardial infarction) ([place])      ROS:   All systems reviewed and negative except as noted in the HPI.   Past Surgical History:  Procedure Laterality Date  . CERVICAL SPINE SURGERY  approx 2011   C3,C4, C5 diskectomy  . CORONARY ARTERY BYPASS GRAFT N/A 01/15/2013   Procedure: CORONARY ARTERY BYPASS GRAFTING (CABG) TIMES FOUR USING LEFT INTERNAL MAMMARY ARTERY AND RIGHT SAPHENOUS LEG VEIN HARVESTED ENDOSCOPICALLY;  Surgeon: Grace Isaac, MD;  Location: [employer] [facility] [employer];  Service: Open Heart Surgery;  Laterality: N/A;  . ENDARTERECTOMY Right 09/30/2013   Procedure: Right Carotid Endarterectomy with Patch;  Surgeon: Angelia Mould, MD;  Location: [place];  Service: Vascular;  Laterality: Right;  . HAND SURGERY    . I & D EXTREMITY Left 07/21/2013   Procedure: IRRIGATION AND DEBRIDEMENT EXTREMITY;  Surgeon: Wynonia Sours, MD;  Location: [place];  Service: Orthopedics;  Laterality: Left;  . INTRAOPERATIVE TRANSESOPHAGEAL ECHOCARDIOGRAM N/A 01/15/2013   Procedure: INTRAOPERATIVE TRANSESOPHAGEAL ECHOCARDIOGRAM;  Surgeon: Grace Isaac, MD;  Location: [place];  Service: Open Heart Surgery;  Laterality: N/A;  . LEFT HEART CATH N/A 01/11/2013   Procedure: LEFT HEART CATH;  Surgeon: Burnell Blanks, MD;  Location: [facility] CATH LAB;  Service: Cardiovascular;  Laterality: N/A;  .  TONSILLECTOMY       Family History  Problem Relation Age of Onset  . Heart attack Father   . Heart disease Father   . Hypertension Father   . Hyperlipidemia Father      Social History   Socioeconomic History  . Marital status: Married    Spouse name: Not on file  . Number of children: Not on file  . Years of education: Not on file  . Highest education level: Not on file  Occupational History  . Not on file  Tobacco Use  . Smoking status: Never Smoker  . Smokeless tobacco: Never Used  Substance and Sexual Activity  . Alcohol use: Yes    Alcohol/week: 0.0 standard drinks    Comment: occasional  . Drug use: No  .  Sexual activity: Not on file  Other Topics Concern  . Not on file  Social History Narrative  . Not on file   Social Determinants of Health   Financial Resource Strain:   . Difficulty of Paying Living Expenses: Not on file  Food Insecurity:   . Worried About Programme researcher, broadcasting/film/video in the Last Year: Not on file  . Ran Out of Food in the Last Year: Not on file  Transportation Needs:   . Lack of Transportation (Medical): Not on file  . Lack of Transportation (Non-Medical): Not on file  Physical Activity:   . Days of Exercise per Week: Not on file  . Minutes of Exercise per Session: Not on file  Stress:   . Feeling of Stress : Not on file  Social Connections:   . Frequency of Communication with Friends and Family: Not on file  . Frequency of Social Gatherings with Friends and Family: Not on file  . Attends Religious Services: Not on file  . Active Member of Clubs or Organizations: Not on file  . Attends Banker Meetings: Not on file  . Marital Status: Not on file  Intimate Partner Violence:   . Fear of Current or Ex-Partner: Not on file  . Emotionally Abused: Not on file  . Physically Abused: Not on file  . Sexually Abused: Not on file     BP 122/70   Pulse 75   Ht 5\' 11"  (1.803 m)   Wt 162 lb (73.5 kg)   SpO2 97%   BMI 22.59 kg/m   Physical Exam:  Well appearing NAD HEENT: Unremarkable Neck:  No JVD, no thyromegally Lymphatics:  No adenopathy Back:  No CVA tenderness Lungs:  Clear with no wheezes HEART:  Regular rate rhythm, no murmurs, no rubs, no clicks Abd:  soft, positive bowel sounds, no organomegally, no rebound, no guarding Ext:  2 plus pulses, no edema, no cyanosis, no clubbing Skin:  No rashes no nodules Neuro:  CN II through XII intact, motor grossly intact  Assess/Plan: 1. SVT - I have reviewed his heart monitor in detail. 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 We discussed the mechanistic differences as well as the  ablation strategies and limitations. I have recommended he continue as needed metoprolol.  2. CAD - he exercises regularly without symptoms of chest pain. He will continue his medical therapy. I spent 25 minutes including over 50% face to face time with the patient.  Rahiem Schellinger,M.D. .D.

## 2019-02-13 ENCOUNTER — Telehealth: Payer: Self-pay | Admitting: Pharmacist

## 2019-02-13 NOTE — Telephone Encounter (Signed)
Called patient to set up repeat lipid panel. Left message on voicemail. Praluent was increased to 150mg  back in October.

## 2019-03-03 NOTE — Telephone Encounter (Signed)
Left voicemail for patient to return call to get up repeat lipid labs

## 2019-03-12 DIAGNOSIS — H9011 Conductive hearing loss, unilateral, right ear, with unrestricted hearing on the contralateral side: Secondary | ICD-10-CM | POA: Diagnosis not present

## 2019-03-12 DIAGNOSIS — S00411A Abrasion of right ear, initial encounter: Secondary | ICD-10-CM | POA: Diagnosis not present

## 2019-03-12 DIAGNOSIS — Z8669 Personal history of other diseases of the nervous system and sense organs: Secondary | ICD-10-CM | POA: Diagnosis not present

## 2019-03-12 DIAGNOSIS — H6122 Impacted cerumen, left ear: Secondary | ICD-10-CM | POA: Diagnosis not present

## 2019-04-12 ENCOUNTER — Ambulatory Visit: Payer: BC Managed Care – PPO | Attending: Internal Medicine

## 2019-04-12 DIAGNOSIS — Z23 Encounter for immunization: Secondary | ICD-10-CM

## 2019-04-12 NOTE — Progress Notes (Signed)
   Covid-19 Vaccination Clinic  Name:  Andrew Sampson    MRN: [id number] DOB: 1960/01/31  04/12/2019  [name] was observed post Covid-19 immunization for 15 minutes without incident. He was provided with Vaccine Information Sheet and instruction to access the V-Safe system.   [name] was instructed to call 911 with any severe reactions post vaccine: Marland Kitchen Difficulty breathing  . Swelling of face and throat  . A fast heartbeat  . A bad rash all over body  . Dizziness and weakness   Immunizations Administered    Name Date Dose VIS Date Route   Pfizer COVID-19 Vaccine 04/12/2019  2:43 PM 0.3 mL 01/10/2019 Intramuscular   Manufacturer: [employer], [employer]   Lot: [id number]   NDC: [address]-5

## 2019-05-06 ENCOUNTER — Ambulatory Visit: Payer: BC Managed Care – PPO

## 2019-05-06 ENCOUNTER — Ambulatory Visit: Payer: BC Managed Care – PPO | Attending: Internal Medicine

## 2019-05-06 DIAGNOSIS — Z23 Encounter for immunization: Secondary | ICD-10-CM

## 2019-05-06 NOTE — Progress Notes (Signed)
   Covid-19 Vaccination Clinic  Name:  KYRIAKOS BABLER    MRN: [id number] DOB: 1960/10/29  05/06/2019  Mr. Esguerra was observed post Covid-19 immunization for 15 minutes without incident. He was provided with Vaccine Information Sheet and instruction to access the V-Safe system.   Mr. Addo was instructed to call 911 with any severe reactions post vaccine: Marland Kitchen Difficulty breathing  . Swelling of face and throat  . A fast heartbeat  . A bad rash all over body  . Dizziness and weakness   Immunizations Administered    Name Date Dose VIS Date Route   Pfizer COVID-19 Vaccine 05/06/2019  4:36 PM 0.3 mL 01/10/2019 Intramuscular   Manufacturer: [employer], [employer]   Lot: [id number]   NDC: [address]-2

## 2019-05-30 DIAGNOSIS — E7800 Pure hypercholesterolemia, unspecified: Secondary | ICD-10-CM | POA: Diagnosis not present

## 2019-05-30 DIAGNOSIS — R7301 Impaired fasting glucose: Secondary | ICD-10-CM | POA: Diagnosis not present

## 2019-06-02 ENCOUNTER — Telehealth: Payer: Self-pay | Admitting: Pharmacist

## 2019-06-02 NOTE — Telephone Encounter (Signed)
05/30/19: TC 155, HDL 28, LDL 104, TG 127  LDL just slightly better than 113 back in October Baseline LDL in ~210 Has been on PCSK9i since 2015 Intolerant:  Simvastatin (myalgias), Crestor (myalgias), Atorvastatin (with PCP in past  - myalgias), Zetia (fatigue and weight gain).   Extensive cardiac history Patient was supposed to increase Praluent to 150mg  q 2 weeks. Will need to confirm he did.  Could consider Nexletol add on if he is on Praluent 150 Left VM for patient to call back.

## 2019-06-02 NOTE — Telephone Encounter (Signed)
Spoke with patient about his results. Discussed adding Nexletol for further LDL reduction with an LDL goal of <70. Patient hesitant to add more medication. States he rides his bike 60 miles a week and eat a good diet. Would like to know Dr. Eden Emms opinion on if he should add nexletol. Cannot tolerate statins or zetia.

## 2019-06-09 NOTE — Telephone Encounter (Signed)
Called patient to let him know Dr. Fabio Bering recommendation. Left VM for patient to call back.

## 2019-06-09 NOTE — Telephone Encounter (Signed)
Wilth LDL 113 and his aggressive CAD/Carotid disease at young age yes would add Nexlitol

## 2019-06-24 MED ORDER — NEXLETOL 180 MG PO TABS: 1.0000 | ORAL_TABLET | Freq: Every day | ORAL | 3 refills | Status: DC

## 2019-06-24 NOTE — Addendum Note (Signed)
Addended by: Malena Peer D on: 06/24/2019 12:48 PM   Modules accepted: Orders

## 2019-06-24 NOTE — Telephone Encounter (Signed)
Nexletol approved. Rx sent to pharmacy and copay card faxed.

## 2019-07-23 NOTE — Progress Notes (Deleted)
Patient ID: Andrew Sampson, male   DOB: 06-08-1960, 59 y.o.   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 LIMA to LAD, SVG IM, SVG OM, SVG RCA He did  Not have high grade carotid disease on pre CABG dopplers but had a TIA and required right CEA on 09/30/13   Has muscle weakness with multiple statins and it really effects his cycling  Now on praluent and nexlitol  and followed in lipid clinic Giving himself shots   ETT done 10/08/15 normal ETT 11/28/18 normal both with HTN response   Has son Graduated  at [employer] trying to get into law school  *** Daughter Senior   HP *** And Daughter going to [employer]   Frequent palpitations while riding mountain bike Monitor 05/28/18 noted episodes of junctional tachycardia Seen by Dr [name]  01/21/19 and ablation deferred   Had COVID vaccine in March  ***  ROS: Denies fever, malais, weight loss, blurry vision, decreased visual acuity, cough, sputum, SOB, hemoptysis, pleuritic pain, palpitaitons, heartburn, abdominal pain, melena, lower extremity edema, claudication, or rash.  All other systems reviewed and negative  General: There were no vitals taken for this visit. Affect appropriate Healthy:  appears stated age 35: normal Neck supple with no adenopathy JVP normal right CEA no thyromegaly Lungs clear with no wheezing and good diaphragmatic motion Heart:  S1/S2 no murmur, no rub, gallop or click PMI normal post sternotomy  Abdomen: benighn, BS positve, no tenderness, no AAA no bruit.  No HSM or HJR Distal pulses intact with no bruits No edema Neuro non-focal Skin warm and dry No muscular weakness     Current Outpatient Medications  Medication Sig Dispense Refill  . acyclovir (ZOVIRAX) 200 MG capsule Take 400 mg by mouth at bedtime.    . Alirocumab (PRALUENT) 150 MG/ML SOAJ Inject 1 pen into the skin every 14 (fourteen) days. 2 pen 11  . aspirin 325 MG tablet Take 162.5 mg by mouth daily.    .  Bempedoic Acid (NEXLETOL) 180 MG TABS Take 1 tablet by mouth daily. 90 tablet 3  . clonazePAM (KLONOPIN) 0.5 MG tablet Take 0.5 mg by mouth at bedtime as needed for anxiety.     . Coenzyme Q10 200 MG capsule Take 200 mg by mouth daily.     Marland Kitchen esomeprazole (NEXIUM) 40 MG capsule Take 40 mg by mouth daily at 12 noon.    Marland Kitchen ibuprofen (ADVIL,MOTRIN) 200 MG tablet Take 400 mg by mouth every 6 (six) hours as needed for moderate pain.    . metoprolol succinate (TOPROL XL) 25 MG 24 hr tablet Take one tablet two hours prior to exercise as needed for palpitations. 30 tablet 11  . propranolol (INDERAL) 10 MG tablet Take 1 tablet (10 mg total) by mouth daily as needed (palpitations). 30 tablet 2  . RESTASIS 0.05 % ophthalmic emulsion Place 1 drop into both eyes at bedtime.      No current facility-administered medications for this visit.    Allergies  Statins and Zetia [ezetimibe]  Electrocardiogram:  SR normal 9/15   09/21/14  SR rate 62  Normal ECG  06/30/16 SR rate 64 normal ECG  Assessment and Plan  CAD:  Stable with no angina and good activity level.  Continue medical Rx 3 years post CABG   ETT normal 10/08/15  CEA: no change in residual bruit Duplex 81/85/63 14-97% LICA but low end of range Has f/u with [facility]  Chol;  LDL was 113 on Praluent Nexlitol added 06/24/19 f/u Lipid clinic   BP:  HTN response to exercise on ETT  Home readings ok Fatigue with beta blocker   PSA:  Indicated PSA elevated last office visit with Dr Clelia Croft  ? From bicycle riding improved when off the bike  Arrhythmia:  PRN beta blocker f/u Ladona Ridgel to reconsider ablation if needed   Anxiety/Depression:  Worse currently in winter with seasonal affective disorder f/u primary Causing some somatization of symptoms   F/U with me in 6 months   [name]

## 2019-07-24 ENCOUNTER — Ambulatory Visit: Payer: BC Managed Care – PPO | Admitting: Cardiovascular Disease

## 2019-09-15 NOTE — Progress Notes (Deleted)
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    09/30/13  Had R CEA for TIA despite preop dopplers only showing moderate stenosis   Has muscle weakness with multiple statins and it really effects his cyclingNow on praluent and followed in lipid clinic Giving himself shots LDL was 113 and Nexlitol had been approved to start and reach target LDL of <70 06/24/19   Has son Graduated  at [employer] trying to get into law school  Daughter graduated from  [employer] And Daughter going to [employer]   He has seasonal affective disorder and is quite anxious about Covid19 virus Has documented atrial tachycardia By monitor 05/20/18 ? 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 Has seen Dr Ladona Ridgel multiple times Rx beta blocker for  Now and not ablation Had ETT 11/28/18 with no arrhythmia and no ischemia. Exercised 10 minutes with peak HR 169 bpm   Had COVID vaccine in April   The patient does not have symptoms concerning for COVID-19 infection (fever, chills, cough, or new shortness of breath).    Prior CV studies:   The following studies were reviewed today:  Carotid 12/13/16  Left 40-59% Right patent post CEA ETT 10/08/15 normal HTN response to exercise  Past Medical History:  Diagnosis Date  . Anxiety   . Atypical chest pain   . Carotid artery  occlusion   . Carotid stenosis 09/30/2013  . Cerebral vascular disease 01/27/2013   Right - 40% to 59% ICA stenosis in the bulb lower end of scale. Vertebral artery flow is antegrade. - Left - 1% to 39% ICA stenosis. Vertebral artery flow is antegrade. 2013-01-12   . Familial hyperlipidemia    LDL 212 on 01/12/13  . GERD (gastroesophageal reflux disease)   . Heart burn   . Hyperlipidemia    statin intolerant  . Occlusion and stenosis of carotid artery without mention of cerebral infarction 10/15/2013  . Palpitations 10/29/2018  . 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 SEMI (subendocardial myocardial infarction) (HCC)   . Statin intolerance 01/10/2013  . TIA (transient ischemic attack) 09/22/2013   Past Surgical History:  Procedure Laterality Date  . CERVICAL SPINE SURGERY  approx 2011   C3,C4, C5 diskectomy  . CORONARY ARTERY BYPASS GRAFT N/A 01/15/2013   Procedure: CORONARY ARTERY BYPASS GRAFTING (CABG) TIMES FOUR USING LEFT INTERNAL MAMMARY ARTERY AND RIGHT SAPHENOUS LEG VEIN HARVESTED ENDOSCOPICALLY;  Surgeon: [name], MD;  Location: [facility] OR;  Service: Open Heart Surgery;  Laterality: N/A;  . ENDARTERECTOMY Right 09/30/2013   Procedure: Right Carotid Endarterectomy with Patch;  Surgeon: [name], MD;  Location: [facility] OR;  Service: Vascular;  Laterality: Right;  . HAND SURGERY    . I & D EXTREMITY Left 07/21/2013   Procedure: IRRIGATION AND DEBRIDEMENT EXTREMITY;  Surgeon: [name], MD;  Location: [facility];  Service: Orthopedics;  Laterality: Left;  . INTRAOPERATIVE TRANSESOPHAGEAL ECHOCARDIOGRAM N/A 01/15/2013   Procedure: INTRAOPERATIVE TRANSESOPHAGEAL ECHOCARDIOGRAM;  Surgeon: [name], MD;  Location[facility] OR;  Service: Open Heart Surgery;  Laterality: N/A;  . LEFT HEART CATH N/A 01/11/2013   Procedure: LEFT HEART CATH;  Surgeon: [name], MD;  Location: [facility] CATH LAB;  Service: Cardiovascular;  Laterality: N/A;  . TONSILLECTOMY       No  outpatient medications have been marked as taking for the 09/18/19 encounter (Appointment) with [name], MD.     Allergies:   Statins and Zetia [ezetimibe]   Social History   Tobacco Use  . Smoking status: Never Smoker  . Smokeless tobacco: Never Used  Vaping Use  . Vaping Use: Never used  Substance Use Topics  . Alcohol use: Yes    Alcohol/week: 0.0 standard drinks    Comment: occasional  . Drug use: No     Family Hx: The patient's family history includes Heart attack in his father; Heart disease in his father; Hyperlipidemia in his father; Hypertension in his father.  ROS:   Please see the history of present illness.     All other systems reviewed and are negative.   Labs/Other Tests and Data Reviewed:    Recent Labs: No results found for requested labs within last 8760 hours.   Recent Lipid Panel Lab Results  Component Value Date/Time   CHOL 137 09/17/2015 10:47 AM   CHOL 118 11/03/2013 12:11 PM   TRIG 110 09/17/2015 10:47 AM   TRIG 115 11/03/2013 12:11 PM   HDL 21 (L) 09/17/2015 10:47 AM   HDL 22 (L) 11/03/2013 12:11 PM   CHOLHDL 6.5 (H) 09/17/2015 10:47 AM   LDLCALC 94 09/17/2015 10:47 AM   LDLCALC 73 11/03/2013 12:11 PM    Wt Readings from Last 3 Encounters:  01/21/19 162 lb (73.5 kg)  10/29/18 160 lb (72.6 kg)  05/03/18 162 lb (73.5 kg)     Objective:    Vital Signs:  There were no vitals taken for this visit.   Affect appropriate Healthy:  appears stated age HEENT: normal Neck post right CEA  JVP normal no bruits no thyromegaly Lungs clear with no wheezing and good diaphragmatic motion Heart:  S1/S2 no murmur, no rub, gallop or click PMI normal post sternotomy  Abdomen: benighn, BS positve, no tenderness, no AAA no bruit.  No HSM or HJR Distal pulses intact with no bruits No edema Neuro non-focal Skin warm and dry No muscular weakness   ASSESSMENT & PLAN:    1.  CAD/CABG 01/16/13  No angina normal ETT  11/28/18  2.  Post  right CEA:  With moderate left stenosis 40-59% f/u duplex ordered   3. HLD on Praluent , Nexlitol added May 2021 LDL 113 repeat labs ordered  4. HTN:  Well controlled.  Continue current medications and low sodium Dash type diet.   5. Palpitations:  ? Junctional tachycardia lopressor PRN per Dr Ladona Ridgel f/u EP  6. 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  The importance of social distancing was discussed today.  Patient Risk:   After full review of this patient's clinical status, I feel that they  are at least moderate risk at this time.  .     Medication Adjustments/Labs and Tests Ordered: Current medicines are reviewed at length with the patient today.  Concerns regarding medicines are outlined above.  Tests Ordered:  Carotid Dup[lex Lipid and Liver  Medication Changes: No orders of the defined types were placed in this encounter.   Disposition:  Follow up 6 months   Signed, [name], MD  09/15/2019 12:50 PM    [facility]

## 2019-09-18 ENCOUNTER — Ambulatory Visit: Payer: BC Managed Care – PPO | Admitting: Cardiovascular Disease

## 2019-10-14 ENCOUNTER — Telehealth: Payer: Self-pay | Admitting: Cardiovascular Disease

## 2019-10-14 NOTE — Telephone Encounter (Signed)
Can you please send this message to the right department. This is not a refill request. This needs to go to [facility]. Thanks

## 2019-10-14 NOTE — Telephone Encounter (Signed)
New Message:  Reference# [id number] and Store# [id number]    Needs a prior authorization for pt's - Fax was sent on 10-13-19

## 2019-10-15 NOTE — Telephone Encounter (Signed)
PER INSURANCE REPATHA PREFERRED AND SUBMITTED TODAY

## 2019-10-15 NOTE — Telephone Encounter (Signed)
**Note De-Identified Charyl Minervini Obfuscation** Which of the pts medication needs a prior authorization? Please advise.

## 2019-11-06 NOTE — Progress Notes (Signed)
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    09/30/13  Had R CEA for TIA despite preop dopplers only showing moderate stenosis   Has muscle weakness with multiple statins and it really effects his cycling Now on praluent and followed in lipid clinic Giving himself shots   ETT done 11/25/18 normal and no arrhythmia   Has son Graduated  at [employer] trying to get into law school  Daughter graduated from  [employer] And Daughter going to [employer]   He has seasonal affective disorder and is quite anxious about Covid19 virus  Issues with palpitations when riding bike Noted to have junctional tachycardia  Seen by Dr Ladona Ridgel and ablation not recommended PRN lopressor   He feels his new bike with more upright position has mitigated his SVT Work is still challenging being more virtual than he cares for  Both daughters are home now One graduated from [employer] and one came back from [place] After being homesick. [place] but he prefers [place] To do his biking   The patient does not have symptoms concerning for COVID-19 infection (fever, chills, cough, or new shortness of breath).    Prior CV studies:   The following studies were reviewed today:  Carotid 12/13/16  Left 40-59% Right patent post CEA ETT 10/08/15 normal HTN  response to exercise ETT 11/25/18  Monitor 05/10/18   Past Medical History:  Diagnosis Date  . Anxiety   . Atypical chest pain   . Carotid artery occlusion   . Carotid stenosis 09/30/2013  . Cerebral vascular disease 01/27/2013   Right - 40% to 59% ICA stenosis in the bulb lower end of scale. Vertebral artery flow is antegrade. - Left - 1% to 39% ICA stenosis. Vertebral artery flow is antegrade. 2013-01-12   . Familial hyperlipidemia    LDL 212 on 01/12/13  . GERD (gastroesophageal reflux disease)   . Heart burn   . Hyperlipidemia    statin intolerant  . Occlusion and stenosis of carotid artery without mention of cerebral infarction 10/15/2013  . Palpitations 10/29/2018  . 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 SEMI (subendocardial myocardial infarction) (HCC)   . Statin intolerance 01/10/2013  . TIA (transient ischemic attack) 09/22/2013   Past Surgical History:  Procedure Laterality Date  . CERVICAL SPINE SURGERY  approx 2011   C3,C4, C5 diskectomy  . CORONARY ARTERY BYPASS GRAFT N/A 01/15/2013   Procedure: CORONARY ARTERY BYPASS GRAFTING (CABG) TIMES FOUR USING LEFT INTERNAL MAMMARY ARTERY AND RIGHT SAPHENOUS LEG VEIN HARVESTED ENDOSCOPICALLY;  Surgeon: [name], MD;  Location: [facility] OR;  Service: Open Heart Surgery;  Laterality: N/A;  . ENDARTERECTOMY Right 09/30/2013   Procedure: Right Carotid Endarterectomy with Patch;  Surgeon: [name], MD;  Location: [facility] OR;  Service:  Vascular;  Laterality: Right;  . HAND SURGERY    . I & D EXTREMITY Left 07/21/2013   Procedure: IRRIGATION AND DEBRIDEMENT EXTREMITY;  Surgeon: [name], MD;  Location: [facility];  Service: Orthopedics;  Laterality: Left;  . INTRAOPERATIVE TRANSESOPHAGEAL ECHOCARDIOGRAM N/A 01/15/2013   Procedure: INTRAOPERATIVE TRANSESOPHAGEAL ECHOCARDIOGRAM;  Surgeon: [name], MD;  Location: [facility] OR;  Service: Open Heart Surgery;  Laterality: N/A;  . LEFT HEART CATH N/A 01/11/2013   Procedure:  LEFT HEART CATH;  Surgeon: [name], MD;  Location: [facility] C[facility] LAB;  Service: Cardiovascular;  Laterality: N/A;  . TONSILLECTOMY       Current Meds  Medication Sig  . acyclovir (ZOVIRAX) 200 MG capsule Take 400 mg by mouth at bedtime.  Marland Kitchen aspirin 325 MG tablet Take 325 mg by mouth daily.   . clonazePAM (KLONOPIN) 0.5 MG tablet Take 0.5 mg by mouth at bedtime as needed for anxiety.   . Coenzyme Q10 200 MG capsule Take 200 mg by mouth daily.   Marland Kitchen esomeprazole (NEXIUM) 40 MG capsule Take 40 mg by mouth daily at 12 noon.  Marland Kitchen ibuprofen (ADVIL,MOTRIN) 200 MG tablet Take 400 mg by mouth every 6 (six) hours as needed for moderate pain.  . metoprolol succinate (TOPROL XL) 25 MG 24 hr tablet Take one tablet two hours prior to exercise as needed for palpitations.  Marland Kitchen PRALUENT 150 MG/ML SOAJ INJECT 1 PEN INTO THE SKIN EVERY 14 DAYS  . propranolol (INDERAL) 10 MG tablet Take 1 tablet (10 mg total) by mouth daily as needed (palpitations).  . RESTASIS 0.05 % ophthalmic emulsion Place 1 drop into both eyes at bedtime.      Allergies:   Statins and Zetia [ezetimibe]   Social History   Tobacco Use  . Smoking status: Never Smoker  . Smokeless tobacco: Never Used  Vaping Use  . Vaping Use: Never used  Substance Use Topics  . Alcohol use: Yes    Alcohol/week: 0.0 standard drinks    Comment: occasional  . Drug use: No     Family Hx: The patient's family history includes Heart attack in his father; Heart disease in his father; Hyperlipidemia in his father; Hypertension in his father.  ROS:   Please see the history of present illness.     All other systems reviewed and are negative.   Labs/Other Tests and Data Reviewed:    Recent Labs: No results found for requested labs within last 8760 hours.   Recent Lipid Panel Lab Results  Component Value Date/Time   CHOL 137 09/17/2015 10:47 AM   CHOL 118 11/03/2013 12:11 PM   TRIG 110 09/17/2015 10:47 AM   TRIG 115 11/03/2013 12:11 PM    HDL 21 (L) 09/17/2015 10:47 AM   HDL 22 (L) 11/03/2013 12:11 PM   CHOLHDL 6.5 (H) 09/17/2015 10:47 AM   LDLCALC 94 09/17/2015 10:47 AM   LDLCALC 73 11/03/2013 12:11 PM    Wt Readings from Last 3 Encounters:  11/18/19 161 lb 6.4 oz (73.2 kg)  01/21/19 162 lb (73.5 kg)  10/29/18 160 lb (72.6 kg)     Objective:    Vital Signs:  BP 120/70   Pulse (!) 55   Ht 5\' 11"  (1.803 m)   Wt 161 lb 6.4 oz (73.2 kg)   SpO2 97%   BMI 22.51 kg/m    Affect appropriate Healthy:  appears stated age HEENT: normal Neck supple with no adenopathy JVP normal  Post Right  CEA  Lungs clear with no wheezing and good diaphragmatic motion Heart:  S1/S2 no murmur, no rub, gallop or click PMI normal Abdomen: benighn, BS positve, no tenderness, no AAA no bruit.  No HSM or HJR Distal pulses intact with no bruits No edema Neuro non-focal Skin warm and dry No muscular weakness  ASSESSMENT & PLAN:    1.  CAD/CABG 01/16/13  No angina normal ETT October 2020 stable  2.  Post right CEA:  With moderate left stenosis 40-59% f/u duplex ordered   3. HLD on Praluent LDL 104 labs with primary Nexlitol started on 06/24/19 but thought It caused thigh pain he will try to take it 3x/week  4. HTN:  Well controlled.  Continue current medications and low sodium Dash type diet.   5. Palpitations:  Junctional tachycardia seen by Ladona Ridgel PRN lopressor no ablation recommended  6. 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  The importance of social distancing was discussed today.  Patient Risk:   After full review of this patient's clinical status, I feel that they are at least moderate risk at this time.  Time:   Today, I have spent 30 minutes with the patient  with telehealth technology discussing CAD, Carotid disease, palpitations Rx for HTN, and HLD need for lab testing and his anxiety .     Medication Adjustments/Labs and Tests Ordered: Current medicines are reviewed at length with the patient today.  Concerns regarding medicines are outlined above.  Tests Ordered:  Carotid  Medication Changes: No orders of the defined types were placed in this encounter.   Disposition:  Follow up 6 months   Signed, [name], MD  11/18/2019 2:57 PM    [facility]

## 2019-11-14 ENCOUNTER — Other Ambulatory Visit: Payer: Self-pay | Admitting: Cardiovascular Disease

## 2019-11-14 NOTE — Telephone Encounter (Signed)
Refill printed and faxed in since e-scribing is down

## 2019-11-18 ENCOUNTER — Other Ambulatory Visit: Payer: Self-pay

## 2019-11-18 ENCOUNTER — Encounter: Payer: Self-pay | Admitting: Cardiovascular Disease

## 2019-11-18 ENCOUNTER — Ambulatory Visit: Payer: BC Managed Care – PPO | Admitting: Cardiovascular Disease

## 2019-11-18 VITALS — BP 120/70 | HR 55 | Ht 71.0 in | Wt 161.4 lb

## 2019-11-18 DIAGNOSIS — I25810 Atherosclerosis of coronary artery bypass graft(s) without angina pectoris: Secondary | ICD-10-CM | POA: Diagnosis not present

## 2019-11-18 DIAGNOSIS — I6522 Occlusion and stenosis of left carotid artery: Secondary | ICD-10-CM

## 2019-11-18 NOTE — Patient Instructions (Addendum)
Medication Instructions:  *If you need a refill on your cardiac medications before your next appointment, please call your pharmacy*  Lab Work: If you have labs (blood work) drawn today and your tests are completely normal, you will receive your results only by: . MyChart Message (if you have MyChart) OR . A paper copy in the mail If you have any lab test that is abnormal or we need to change your treatment, we will call you to review the results.  Testing/Procedures: Your physician has requested that you have a carotid duplex. This test is an ultrasound of the carotid arteries in your neck. It looks at blood flow through these arteries that supply the brain with blood. Allow one hour for this exam. There are no restrictions or special instructions.  Follow-Up: At [facility], you and your health needs are our priority.  As part of our continuing mission to provide you with exceptional heart care, we have created designated Provider Care Teams.  These Care Teams include your primary Cardiologist (physician) and Advanced Practice Providers (APPs -  Physician Assistants and Nurse Practitioners) who all work together to provide you with the care you need, when you need it.  We recommend signing up for the patient portal called "MyChart".  Sign up information is provided on this After Visit Summary.  MyChart is used to connect with patients for Virtual Visits (Telemedicine).  Patients are able to view lab/test results, encounter notes, upcoming appointments, etc.  Non-urgent messages can be sent to your provider as well.   To learn more about what you can do with MyChart, go to [email or web address].    Your next appointment:   6 month(s)  The format for your next appointment:   In Person  Provider:   You may see Peter Nishan, MD or one of the following Advanced Practice Providers on your designated Care Team:    Lori Gerhardt, NP  Laura Ingold, NP  Jill McDaniel, NP    

## 2019-11-28 DIAGNOSIS — E7800 Pure hypercholesterolemia, unspecified: Secondary | ICD-10-CM | POA: Diagnosis not present

## 2019-11-28 DIAGNOSIS — Z0000 Encounter for general adult medical examination without abnormal findings: Secondary | ICD-10-CM | POA: Diagnosis not present

## 2019-11-28 DIAGNOSIS — R7301 Impaired fasting glucose: Secondary | ICD-10-CM | POA: Diagnosis not present

## 2019-11-28 DIAGNOSIS — Z125 Encounter for screening for malignant neoplasm of prostate: Secondary | ICD-10-CM | POA: Diagnosis not present

## 2019-12-01 ENCOUNTER — Other Ambulatory Visit: Payer: Self-pay

## 2019-12-01 ENCOUNTER — Other Ambulatory Visit (HOSPITAL_COMMUNITY): Payer: Self-pay | Admitting: Cardiovascular Disease

## 2019-12-01 ENCOUNTER — Ambulatory Visit (HOSPITAL_COMMUNITY)
Admission: RE | Admit: 2019-12-01 | Discharge: 2019-12-01 | Disposition: A | Discharge status: Home / Self Care | Payer: BC Managed Care – PPO | Source: Ambulatory Visit | Attending: Cardiology | Admitting: Cardiology

## 2019-12-01 DIAGNOSIS — I25810 Atherosclerosis of coronary artery bypass graft(s) without angina pectoris: Secondary | ICD-10-CM | POA: Diagnosis not present

## 2019-12-01 DIAGNOSIS — Z9889 Other specified postprocedural states: Secondary | ICD-10-CM

## 2019-12-01 DIAGNOSIS — I6522 Occlusion and stenosis of left carotid artery: Secondary | ICD-10-CM

## 2019-12-01 DIAGNOSIS — I6523 Occlusion and stenosis of bilateral carotid arteries: Secondary | ICD-10-CM

## 2019-12-16 ENCOUNTER — Encounter: Payer: Self-pay | Admitting: Cardiovascular Disease

## 2019-12-16 MED ORDER — REPATHA SURECLICK 140 MG/ML ~~LOC~~ SOAJ: 1.0000 "pen " | SUBCUTANEOUS | 11 refills | Status: DC

## 2019-12-16 NOTE — Telephone Encounter (Signed)
error 

## 2019-12-29 MED ORDER — PRALUENT 150 MG/ML ~~LOC~~ SOAJ: 1.0000 "pen " | SUBCUTANEOUS | 11 refills | Status: DC

## 2020-01-10 DIAGNOSIS — H5213 Myopia, bilateral: Secondary | ICD-10-CM | POA: Diagnosis not present

## 2020-01-12 DIAGNOSIS — D171 Benign lipomatous neoplasm of skin and subcutaneous tissue of trunk: Secondary | ICD-10-CM | POA: Diagnosis not present

## 2020-01-16 ENCOUNTER — Ambulatory Visit: Payer: BC Managed Care – PPO | Attending: Internal Medicine

## 2020-01-16 DIAGNOSIS — Z23 Encounter for immunization: Secondary | ICD-10-CM

## 2020-01-16 NOTE — Progress Notes (Signed)
   Covid-19 Vaccination Clinic  Name:  RAMAL ECKHARDT    MRN: [id number] DOB: [birth year]  01/16/2020  Mr. Charley was observed post Covid-19 immunization for 15 minutes without incident. He was provided with Vaccine Information Sheet and instruction to access the V-Safe system.   Mr. Camilli was instructed to call 911 with any severe reactions post vaccine: Marland Kitchen Difficulty breathing  . Swelling of face and throat  . A fast heartbeat  . A bad rash all over body  . Dizziness and weakness   Immunizations Administered    Name Date Dose VIS Date Route   Pfizer COVID-19 Vaccine 01/16/2020  1:35 PM 0.3 mL 11/19/2019 Intramuscular   Manufacturer: [employer], [employer]   Lot: [id number]   NDC: [address]-6

## 2020-03-02 DIAGNOSIS — D171 Benign lipomatous neoplasm of skin and subcutaneous tissue of trunk: Secondary | ICD-10-CM | POA: Diagnosis not present

## 2020-05-28 DIAGNOSIS — E7800 Pure hypercholesterolemia, unspecified: Secondary | ICD-10-CM | POA: Diagnosis not present

## 2020-05-28 DIAGNOSIS — R7301 Impaired fasting glucose: Secondary | ICD-10-CM | POA: Diagnosis not present

## 2020-05-31 DIAGNOSIS — M25512 Pain in left shoulder: Secondary | ICD-10-CM | POA: Diagnosis not present

## 2020-06-08 DIAGNOSIS — M25512 Pain in left shoulder: Secondary | ICD-10-CM | POA: Diagnosis not present

## 2020-09-06 DIAGNOSIS — M25512 Pain in left shoulder: Secondary | ICD-10-CM | POA: Diagnosis not present

## 2020-09-06 DIAGNOSIS — M542 Cervicalgia: Secondary | ICD-10-CM | POA: Diagnosis not present

## 2020-09-19 DIAGNOSIS — Y9389 Activity, other specified: Secondary | ICD-10-CM | POA: Diagnosis not present

## 2020-09-19 DIAGNOSIS — M79644 Pain in right finger(s): Secondary | ICD-10-CM | POA: Diagnosis not present

## 2020-09-19 DIAGNOSIS — S61214A Laceration without foreign body of right ring finger without damage to nail, initial encounter: Secondary | ICD-10-CM | POA: Diagnosis not present

## 2020-09-19 DIAGNOSIS — W230XXA Caught, crushed, jammed, or pinched between moving objects, initial encounter: Secondary | ICD-10-CM | POA: Diagnosis not present

## 2020-09-19 DIAGNOSIS — S6991XA Unspecified injury of right wrist, hand and finger(s), initial encounter: Secondary | ICD-10-CM | POA: Diagnosis not present

## 2020-09-19 DIAGNOSIS — S61216A Laceration without foreign body of right little finger without damage to nail, initial encounter: Secondary | ICD-10-CM | POA: Diagnosis not present

## 2020-09-22 DIAGNOSIS — S61214A Laceration without foreign body of right ring finger without damage to nail, initial encounter: Secondary | ICD-10-CM | POA: Diagnosis not present

## 2020-09-22 DIAGNOSIS — M25641 Stiffness of right hand, not elsewhere classified: Secondary | ICD-10-CM | POA: Diagnosis not present

## 2020-09-22 DIAGNOSIS — M79644 Pain in right finger(s): Secondary | ICD-10-CM | POA: Diagnosis not present

## 2020-10-01 DIAGNOSIS — S61214A Laceration without foreign body of right ring finger without damage to nail, initial encounter: Secondary | ICD-10-CM | POA: Diagnosis not present

## 2020-10-01 DIAGNOSIS — Z4802 Encounter for removal of sutures: Secondary | ICD-10-CM | POA: Diagnosis not present

## 2020-11-30 ENCOUNTER — Other Ambulatory Visit: Payer: Self-pay

## 2020-11-30 ENCOUNTER — Ambulatory Visit (HOSPITAL_COMMUNITY)
Admission: RE | Admit: 2020-11-30 | Discharge: 2020-11-30 | Disposition: A | Discharge status: Home / Self Care | Payer: BC Managed Care – PPO | Source: Ambulatory Visit | Attending: Cardiovascular Disease | Admitting: Cardiovascular Disease

## 2020-11-30 DIAGNOSIS — I6523 Occlusion and stenosis of bilateral carotid arteries: Secondary | ICD-10-CM | POA: Diagnosis not present

## 2020-11-30 DIAGNOSIS — Z9889 Other specified postprocedural states: Secondary | ICD-10-CM | POA: Diagnosis not present

## 2020-12-03 ENCOUNTER — Other Ambulatory Visit: Payer: Self-pay | Admitting: Cardiovascular Disease

## 2020-12-03 DIAGNOSIS — I214 Non-ST elevation (NSTEMI) myocardial infarction: Secondary | ICD-10-CM

## 2020-12-03 DIAGNOSIS — E785 Hyperlipidemia, unspecified: Secondary | ICD-10-CM

## 2020-12-03 DIAGNOSIS — Z789 Other specified health status: Secondary | ICD-10-CM

## 2020-12-07 DIAGNOSIS — Z125 Encounter for screening for malignant neoplasm of prostate: Secondary | ICD-10-CM | POA: Diagnosis not present

## 2020-12-07 DIAGNOSIS — Z0000 Encounter for general adult medical examination without abnormal findings: Secondary | ICD-10-CM | POA: Diagnosis not present

## 2020-12-07 DIAGNOSIS — R7303 Prediabetes: Secondary | ICD-10-CM | POA: Diagnosis not present

## 2020-12-07 DIAGNOSIS — E7800 Pure hypercholesterolemia, unspecified: Secondary | ICD-10-CM | POA: Diagnosis not present

## 2021-01-04 ENCOUNTER — Encounter: Payer: Self-pay | Admitting: Pharmacist

## 2021-01-15 DIAGNOSIS — H5213 Myopia, bilateral: Secondary | ICD-10-CM | POA: Diagnosis not present

## 2021-01-27 ENCOUNTER — Telehealth: Payer: Self-pay | Admitting: Pharmacist

## 2021-01-27 NOTE — Telephone Encounter (Signed)
Called and left a VM for pt to call back or to respond to mychart message in regards to his labs. See my chart message from 12/6

## 2021-06-10 DIAGNOSIS — R7303 Prediabetes: Secondary | ICD-10-CM | POA: Diagnosis not present

## 2021-06-10 DIAGNOSIS — E7800 Pure hypercholesterolemia, unspecified: Secondary | ICD-10-CM | POA: Diagnosis not present

## 2021-07-09 DIAGNOSIS — S0990XA Unspecified injury of head, initial encounter: Secondary | ICD-10-CM | POA: Diagnosis not present

## 2021-07-09 DIAGNOSIS — W010XXA Fall on same level from slipping, tripping and stumbling without subsequent striking against object, initial encounter: Secondary | ICD-10-CM | POA: Diagnosis not present

## 2021-07-09 DIAGNOSIS — Z96642 Presence of left artificial hip joint: Secondary | ICD-10-CM | POA: Diagnosis not present

## 2021-07-09 DIAGNOSIS — M25552 Pain in left hip: Secondary | ICD-10-CM | POA: Diagnosis not present

## 2021-07-09 DIAGNOSIS — Y92009 Unspecified place in unspecified non-institutional (private) residence as the place of occurrence of the external cause: Secondary | ICD-10-CM | POA: Diagnosis not present

## 2021-07-09 DIAGNOSIS — S7002XA Contusion of left hip, initial encounter: Secondary | ICD-10-CM | POA: Diagnosis not present

## 2021-07-18 DIAGNOSIS — Z96642 Presence of left artificial hip joint: Secondary | ICD-10-CM | POA: Diagnosis not present

## 2021-10-13 DIAGNOSIS — M5441 Lumbago with sciatica, right side: Secondary | ICD-10-CM | POA: Diagnosis not present

## 2021-10-13 DIAGNOSIS — M9904 Segmental and somatic dysfunction of sacral region: Secondary | ICD-10-CM | POA: Diagnosis not present

## 2021-10-13 DIAGNOSIS — M9905 Segmental and somatic dysfunction of pelvic region: Secondary | ICD-10-CM | POA: Diagnosis not present

## 2021-10-13 DIAGNOSIS — M9903 Segmental and somatic dysfunction of lumbar region: Secondary | ICD-10-CM | POA: Diagnosis not present

## 2021-12-08 ENCOUNTER — Other Ambulatory Visit: Payer: Self-pay | Admitting: Cardiovascular Disease

## 2021-12-08 DIAGNOSIS — I214 Non-ST elevation (NSTEMI) myocardial infarction: Secondary | ICD-10-CM

## 2021-12-08 DIAGNOSIS — Z789 Other specified health status: Secondary | ICD-10-CM

## 2021-12-08 DIAGNOSIS — E785 Hyperlipidemia, unspecified: Secondary | ICD-10-CM

## 2021-12-15 DIAGNOSIS — Z125 Encounter for screening for malignant neoplasm of prostate: Secondary | ICD-10-CM | POA: Diagnosis not present

## 2021-12-15 DIAGNOSIS — Z0000 Encounter for general adult medical examination without abnormal findings: Secondary | ICD-10-CM | POA: Diagnosis not present

## 2021-12-15 DIAGNOSIS — E7800 Pure hypercholesterolemia, unspecified: Secondary | ICD-10-CM | POA: Diagnosis not present

## 2021-12-15 DIAGNOSIS — F411 Generalized anxiety disorder: Secondary | ICD-10-CM | POA: Diagnosis not present

## 2021-12-15 DIAGNOSIS — I2510 Atherosclerotic heart disease of native coronary artery without angina pectoris: Secondary | ICD-10-CM | POA: Diagnosis not present

## 2021-12-15 DIAGNOSIS — R7303 Prediabetes: Secondary | ICD-10-CM | POA: Diagnosis not present

## 2022-01-11 ENCOUNTER — Telehealth: Payer: Self-pay

## 2022-01-11 NOTE — Patient Outreach (Signed)
  Care Coordination   01/11/2022 Name: TRUITT CRUEY MRN: [id number] DOB: November 25, 1960   Care Coordination Outreach Attempts:  An unsuccessful telephone outreach was attempted today to offer the patient information about available care coordination services as a benefit of their health plan.   Follow Up Plan:  Additional outreach attempts will be made to offer the patient care coordination information and services.   Encounter Outcome:  No Answer   Care Coordination Interventions:  No, not indicated    Dudley Major RN, BSN,CCM, CDE Care Management Coordinator [facility] Care Management [phone]

## 2022-01-18 ENCOUNTER — Telehealth: Payer: Self-pay

## 2022-01-18 NOTE — Patient Outreach (Signed)
  Care Coordination   01/18/2022 Name: Andrew Sampson MRN: [id number] DOB: 1960-09-16   Care Coordination Outreach Attempts:  A second unsuccessful outreach was attempted today to offer the patient with information about available care coordination services as a benefit of their health plan.     Follow Up Plan:  Additional outreach attempts will be made to offer the patient care coordination information and services.   Encounter Outcome:  No Answer   Care Coordination Interventions:  No, not indicated    Dudley Major RN, BSN,CCM, CDE Care Management Coordinator [facility] Care Management [phone]

## 2022-01-21 DIAGNOSIS — H5213 Myopia, bilateral: Secondary | ICD-10-CM | POA: Diagnosis not present

## 2022-03-17 NOTE — Progress Notes (Signed)
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 He has not tolerated statins or zetia Due to muscle pain in legs He is very sensitive to this due to his biking    Prior CV studies:   The following studies were reviewed today:  Carotid 12/13/16  Left 40-59% Right patent post CEA ETT 10/08/15 normal HTN response to exercise ETT 11/25/18  Monitor 05/10/18   ECG:  03/29/2022 SR low atrial focus T inversions  3,F   Past Medical History:  Diagnosis Date   Anxiety    Atypical chest pain    Carotid artery occlusion    Carotid stenosis 09/30/2013   Cerebral vascular disease 01/27/2013   Right - 40% to 59% ICA stenosis in the bulb lower end of scale. Vertebral artery flow is antegrade. - Left - 1% to 39% ICA stenosis. Vertebral artery flow is antegrade. 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   SEMI (subendocardial myocardial infarction) ([place])    Statin intolerance 01/10/2013   TIA (transient ischemic attack) 09/22/2013   Past Surgical History:  Procedure Laterality Date   CERVICAL SPINE SURGERY  approx 2011   C3,C4, C5 diskectomy   CORONARY ARTERY BYPASS GRAFT N/A 01/15/2013   Procedure: CORONARY ARTERY BYPASS GRAFTING (CABG) TIMES FOUR USING LEFT INTERNAL MAMMARY ARTERY AND RIGHT SAPHENOUS LEG VEIN HARVESTED ENDOSCOPICALLY;  Surgeon: Grace Isaac, MD;  Location: [place];  Service: Open Heart Surgery;  Laterality: N/A;   ENDARTERECTOMY Right 09/30/2013   Procedure: Right Carotid Endarterectomy with Patch;  Surgeon: Angelia Mould, MD;  Location:  [place] [facility];  Service: Vascular;  Laterality: Right;   HAND SURGERY     I & D EXTREMITY Left 07/21/2013   Procedure: IRRIGATION AND DEBRIDEMENT EXTREMITY;  Surgeon: Wynonia Sours, MD;  Location: [place];  Service: Orthopedics;  Laterality: Left;   INTRAOPERATIVE TRANSESOPHAGEAL ECHOCARDIOGRAM N/A 01/15/2013   Procedure: INTRAOPERATIVE TRANSESOPHAGEAL ECHOCARDIOGRAM;  Surgeon: Grace Isaac, MD;  Location: [place];  Service: Open Heart Surgery;  Laterality: N/A;   LEFT HEART CATH N/A 01/11/2013   Procedure: LEFT HEART CATH;  Surgeon: Burnell Blanks, MD;  Location[facility]  CATH LAB;  Service: Cardiovascular;  Laterality: N/A;   TONSILLECTOMY       Current Meds  Medication Sig   acyclovir (ZOVIRAX) 200 MG capsule Take 400 mg by mouth at bedtime.   aspirin 325 MG tablet Take 325 mg by mouth daily.    clonazePAM (KLONOPIN) 0.5 MG tablet Take 0.5 mg by mouth at bedtime as needed for anxiety.    Coenzyme Q10 200 MG capsule Take 200 mg by mouth daily.    esomeprazole (NEXIUM) 40 MG capsule Take 40 mg by mouth daily at 12 noon.   ibuprofen (ADVIL,MOTRIN) 200 MG tablet Take 400 mg by mouth every 6 (six) hours as needed for moderate pain.   metoprolol succinate (TOPROL XL) 25 MG 24 hr tablet Take one tablet two hours prior to exercise as needed for palpitations.   PRALUENT 150 MG/ML SOAJ INJECT 1 PEN INTO THE SKIN EVERY 14 DAYS   RESTASIS 0.05 % ophthalmic emulsion Place 1 drop into both eyes at bedtime.    [DISCONTINUED] propranolol (INDERAL) 10 MG tablet Take 1 tablet (10 mg total) by mouth daily as needed (palpitations).     Allergies:   Repatha [evolocumab], Statins, and Zetia [ezetimibe]   Social History   Tobacco Use   Smoking status: Never   Smokeless tobacco: Never  Vaping Use   Vaping Use: Never used  Substance Use Topics   Alcohol use: Yes    Alcohol/week: 0.0 standard drinks of alcohol    Comment: occasional   Drug use: No     Family Hx: The patient's family history includes Heart attack in his father; Heart disease in his father; Hyperlipidemia in his father; Hypertension in his father.  ROS:   Please see the history of present illness.     All other systems reviewed and are negative.   Labs/Other Tests and Data Reviewed:    Recent Labs: No results found for requested labs within last 365 days.   Recent Lipid Panel Lab Results  Component Value Date/Time   CHOL 137 09/17/2015 10:47 AM   CHOL 118 11/03/2013 12:11 PM   TRIG 110 09/17/2015 10:47 AM   TRIG 115 11/03/2013 12:11 PM   HDL 21 (L) 09/17/2015 10:47 AM   HDL 22 (L)  11/03/2013 12:11 PM   CHOLHDL 6.5 (H) 09/17/2015 10:47 AM   LDLCALC 94 09/17/2015 10:47 AM   LDLCALC 73 11/03/2013 12:11 PM    Wt Readings from Last 3 Encounters:  03/29/22 167 lb 3.2 oz (75.8 kg)  11/18/19 161 lb 6.4 oz (73.2 kg)  01/21/19 162 lb (73.5 kg)     Objective:    Vital Signs:  Pulse (!) 59   Ht '5\' 11"'$  (1.803 m)   Wt 167 lb 3.2 oz (75.8 kg)   SpO2 97%   BMI 23.32 kg/m    Affect appropriate Healthy:  appears stated age HEENT: normal Neck supple with no adenopathy  JVP normal  Post Right CEA  Lungs clear with no wheezing and good diaphragmatic motion Heart:  S1/S2 no murmur, no rub, gallop or click PMI normal Abdomen: benighn, BS positve, no tenderness, no AAA no bruit.  No HSM or HJR Distal pulses intact with no bruits No edema Neuro non-focal Skin warm and dry No muscular weakness  ASSESSMENT & PLAN:    1.  CAD/CABG 01/16/13  No angina normal ETT October 2020 stable  2.  Post right CEA:  11/30/20 plaque no stenosis update [place] 3. HLD on Praluent labs with primary LDL 119 12/15/21 refer to lipid clinic and consider adding nexlitol to reach target given his aggressive atherosclerotic dx 4. HTN:  Well controlled.  Continue current medications and low sodium Dash type diet.   5. Palpitations:  Junctional tachycardia seen by [name] PRN lopressor no ablation recommended  6. 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  Concerns regarding medicines are outlined above.  Tests Ordered:  Carotid  Refer to Lipid clinic for Nexlitol  Medication Changes: No orders of the defined types were placed in this encounter.   Disposition:  Follow up in a year   Signed, [name], MD  03/29/2022 4:13 PM    [facility]

## 2022-03-29 ENCOUNTER — Ambulatory Visit: Payer: BC Managed Care – PPO | Attending: Cardiovascular Disease | Admitting: Cardiovascular Disease

## 2022-03-29 VITALS — BP 138/93 | HR 59 | Ht 71.0 in | Wt 167.2 lb

## 2022-03-29 DIAGNOSIS — Z951 Presence of aortocoronary bypass graft: Secondary | ICD-10-CM | POA: Diagnosis not present

## 2022-03-29 DIAGNOSIS — I6523 Occlusion and stenosis of bilateral carotid arteries: Secondary | ICD-10-CM | POA: Diagnosis not present

## 2022-03-29 DIAGNOSIS — E785 Hyperlipidemia, unspecified: Secondary | ICD-10-CM

## 2022-03-29 NOTE — Patient Instructions (Addendum)
Medication Instructions:  Your physician recommends that you continue on your current medications as directed. Please refer to the Current Medication list given to you today.  *If you need a refill on your cardiac medications before your next appointment, please call your pharmacy*  Lab Work: If you have labs (blood work) drawn today and your tests are completely normal, you will receive your results only by: [place] (if you have MyChart) OR A paper copy in the mail If you have any lab test that is abnormal or we need to change your treatment, we will call you to review the results.  Testing/Procedures: Your physician has requested that you have a carotid duplex. This test is an ultrasound of the carotid arteries in your neck. It looks at blood flow through these arteries that supply the brain with blood. Allow one hour for this exam. There are no restrictions or special instructions.   Follow-Up: At [facility], you and your health needs are our priority.  As part of our continuing mission to provide you with exceptional heart care, we have created designated Provider Care Teams.  These Care Teams include your primary Cardiologist (physician) and Advanced Practice Providers (APPs -  Physician Assistants and Nurse Practitioners) who all work together to provide you with the care you need, when you need it.  We recommend signing up for the patient portal called "MyChart".  Sign up information is provided on this After Visit Summary.  MyChart is used to connect with patients for Virtual Visits (Telemedicine).  Patients are able to view lab/test results, encounter notes, upcoming appointments, etc.  Non-urgent messages can be sent to your provider as well.   To learn more about what you can do with MyChart, go to [email or web address].    Your next appointment:   1 year(s)  Provider:   [name], MD     Your physician recommends that you schedule a follow-up appointment  next available with pharmacy.

## 2022-04-20 ENCOUNTER — Ambulatory Visit (HOSPITAL_COMMUNITY): Payer: BC Managed Care – PPO

## 2022-05-05 ENCOUNTER — Ambulatory Visit (HOSPITAL_COMMUNITY)
Admission: RE | Admit: 2022-05-05 | Discharge: 2022-05-05 | Disposition: A | Discharge status: Home / Self Care | Payer: BC Managed Care – PPO | Source: Ambulatory Visit | Attending: Cardiovascular Disease | Admitting: Cardiovascular Disease

## 2022-05-05 DIAGNOSIS — E785 Hyperlipidemia, unspecified: Secondary | ICD-10-CM

## 2022-05-05 DIAGNOSIS — I6523 Occlusion and stenosis of bilateral carotid arteries: Secondary | ICD-10-CM

## 2022-05-05 DIAGNOSIS — Z951 Presence of aortocoronary bypass graft: Secondary | ICD-10-CM

## 2022-05-12 ENCOUNTER — Ambulatory Visit: Payer: BC Managed Care – PPO

## 2022-05-24 DIAGNOSIS — Z6826 Body mass index (BMI) 26.0-26.9, adult: Secondary | ICD-10-CM | POA: Diagnosis not present

## 2022-05-24 DIAGNOSIS — M5412 Radiculopathy, cervical region: Secondary | ICD-10-CM | POA: Diagnosis not present

## 2022-06-02 ENCOUNTER — Ambulatory Visit: Payer: BC Managed Care – PPO | Attending: Cardiology | Admitting: Pharmacist

## 2022-06-02 DIAGNOSIS — Z951 Presence of aortocoronary bypass graft: Secondary | ICD-10-CM

## 2022-06-02 NOTE — Progress Notes (Unsigned)
Patient ID: [id number]                 DOB: 1960/05/11                    MRN: [id number]     HPI: Andrew Sampson is a 62 y.o. male patient referred to lipid clinic by Dr Eden Emms. PMH is significant for CAD s/p CABG x4v in 12/2012, TIA, bilateral ICA stenosis s/p R carotid endarterectomy, preDM, and seasonal affective disorder. Pt has a history of statin intolerance. LDL remains elevated at 118 on Praluent (baseline LDL 214) and was referred to lipid clinic to discuss Nexletol.  Pt rides his bike 60-70 miles per week and eats a whole food diet with minimal processed food. He is intolerant to 3 statins, Repatha, ezetimibe, and Nexletol. Reports all meds caused thigh and glute pain/weakness which made him unable to ride his bike. Documentation that ezetimibe caused muscle pain but separate note mentions may have caused fatigue and weight gain as well. Tolerating Praluent well except does notice some mild weakness in his muscles for 2-3 days after each injection. Stopped Celebrex recently as he felt it was messing up his metabolism. Has a strong famiy history of heart disease. His maternal aunt and grandmother both died from heart disease at age 59. His mother was diagnosed with heart disease in her 74s. His father had stents placed in his 52s.  Current Medications: Praluent 150mg  Q2W Intolerances: simvastatin, rosuvastatin, atorvastatin 10mg  and 40mg  daily, ezetimibe, Repatha, Nexletol - myalgias Risk Factors: premature CAD, TIA LDL goal: 55mg /dL  Diet: whole food diet  Exercise: rides his bike 60-70 miles a week  Family History: The patient's family history includes Heart attack in his father; Heart disease in his father; Hyperlipidemia in his father; Hypertension in his father. Father had PCI in his 36's. Grandmother developed CAD at age 66 y.o.   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  Labs: 12/15/21: TC 173, HDL 29, nonHDL 144, TG 142, LDL 118 - on Praluent  150mg  Q2W 01/11/13: TC 273, TG 209, HDL 17, LDL 214 - no LLT  Past Medical History:  Diagnosis Date   Anxiety    Atypical chest pain    Carotid artery occlusion    Carotid stenosis 09/30/2013   Cerebral vascular disease 01/27/2013   Right - 40% to 59% ICA stenosis in the bulb lower end of scale. Vertebral artery flow is antegrade. - Left - 1% to 39% ICA stenosis. Vertebral artery flow is antegrade. 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     aspirin 325 MG tablet Take 325 mg by mouth daily.      clonazePAM (KLONOPIN) 0.5 MG tablet Take 0.5 mg by mouth at bedtime as needed for anxiety.      Coenzyme Q10 200 MG capsule Take 200 mg by mouth daily.  esomeprazole (NEXIUM) 40 MG capsule Take 40 mg by mouth daily at 12 noon.     ibuprofen (ADVIL,MOTRIN) 200 MG tablet Take 400 mg by mouth every 6 (six) hours as needed for moderate pain.     metoprolol succinate (TOPROL XL) 25 MG 24 hr tablet Take one tablet two hours prior to exercise as needed for palpitations. 30 tablet 11   PRALUENT 150 MG/ML SOAJ INJECT 1 PEN INTO THE SKIN EVERY 14 DAYS 2 mL 11   RESTASIS 0.05 % ophthalmic emulsion Place 1 drop into both eyes at bedtime.      No current facility-administered medications on file prior to visit.    Allergies  Allergen Reactions   Repatha [Evolocumab] Other (See Comments)    Glute and thigh pain   Statins     Simvastatin (myalgias), Crestor  (myalgias), Lipitor (with PCP in past had myalgias - tolerating this well currently)   Zetia [Ezetimibe]     Muscle aches    Assessment/Plan:  1. Hyperlipidemia - Baseline LDL 214, improved to 115 on Praluent 150mg  Q2W. This causes mild muscle aches for 2-3 days after each injection but is tolerable for pt. He is intolerant to all other options for LDL lowering including 3 statins, Repatha, ezetimibe, and Nexletol. Not appropriate to change to [facility] [place] as this would need to replace his Praluent and it's less effective at LDL lowering. Will check Lp(a) today - if elevated above 175, can refer for ACCLAIM-Lp(a) trial. Discussed that elevated Lp(a) levels are correlated with residual CVD risk and that current CVOT are ongoing to assess CV benefit of Lp(a) lowering. Will call pt with results on Monday.  Manha Amato E. Makoto Sellitto, PharmD, BCACP, CPP [facility] [address], [place], [place] [address] Phone: [phone]; Fax: [phone] 06/02/2022 3:36 PM

## 2022-06-07 LAB — LIPOPROTEIN A (LPA): Lipoprotein (a): 153.7 nmol/L — ABNORMAL HIGH (ref <75.0)

## 2022-06-16 DIAGNOSIS — L608 Other nail disorders: Secondary | ICD-10-CM | POA: Diagnosis not present

## 2022-07-27 ENCOUNTER — Telehealth: Payer: Self-pay

## 2022-07-27 ENCOUNTER — Other Ambulatory Visit (HOSPITAL_COMMUNITY): Payer: Self-pay

## 2022-07-27 ENCOUNTER — Telehealth: Payer: Self-pay | Admitting: Cardiovascular Disease

## 2022-07-27 DIAGNOSIS — E785 Hyperlipidemia, unspecified: Secondary | ICD-10-CM

## 2022-07-27 DIAGNOSIS — Z789 Other specified health status: Secondary | ICD-10-CM

## 2022-07-27 DIAGNOSIS — I214 Non-ST elevation (NSTEMI) myocardial infarction: Secondary | ICD-10-CM

## 2022-07-27 NOTE — Telephone Encounter (Signed)
PA initiated, please see separate encounter for updates on determination. (I will route you back in once a decision has been made)  Brittanny Levenhagen, CPhT Pharmacy Patient Advocate Specialist Direct Number: [phone] Fax: [phone]  

## 2022-07-27 NOTE — Telephone Encounter (Signed)
Pharmacy Patient Advocate Encounter   Received notification from [facility] that prior authorization for PRALUENT 150 MG/ML is needed.    PA submitted on 07/27/22 Key [id number] Status is pending  Haze Rushing, CPhT Pharmacy Patient Advocate Specialist Direct Number: [phone] Fax: [phone]

## 2022-07-27 NOTE — Telephone Encounter (Signed)
Pt c/o medication issue:  1. Name of Medication:   PRALUENT 150 MG/ML SOAJ   2. How are you currently taking this medication (dosage and times per day)?   3. Are you having a reaction (difficulty breathing--STAT)?   4. What is your medication issue?   Caller stated she is following-up on prior authorization for patient's medication.  Prescription# [phone].

## 2022-07-28 ENCOUNTER — Other Ambulatory Visit (HOSPITAL_COMMUNITY): Payer: Self-pay

## 2022-07-28 MED ORDER — PRALUENT 150 MG/ML ~~LOC~~ SOAJ: SUBCUTANEOUS | 11 refills | Status: DC

## 2022-07-28 NOTE — Telephone Encounter (Signed)
PA approved, sent to pharmacy.

## 2022-07-28 NOTE — Telephone Encounter (Signed)
PA cancelled by plan. Active PA already on file.      Prior Authorization for PRALUENT has already been approved.    Effective dates: 12/09/21 through 12/08/22  Haze Rushing, CPhT Pharmacy Patient Advocate Specialist Direct Number: [phone] Fax: [phone]

## 2022-07-28 NOTE — Addendum Note (Signed)
Addended by: Cheree Ditto on: 07/28/2022 10:01 AM   Modules accepted: Orders

## 2022-09-25 ENCOUNTER — Other Ambulatory Visit (HOSPITAL_COMMUNITY): Payer: Self-pay

## 2022-12-15 ENCOUNTER — Other Ambulatory Visit: Payer: Self-pay | Admitting: Cardiovascular Disease

## 2022-12-15 DIAGNOSIS — Z789 Other specified health status: Secondary | ICD-10-CM

## 2022-12-15 DIAGNOSIS — E785 Hyperlipidemia, unspecified: Secondary | ICD-10-CM

## 2022-12-15 DIAGNOSIS — I214 Non-ST elevation (NSTEMI) myocardial infarction: Secondary | ICD-10-CM

## 2022-12-15 MED ORDER — PRALUENT 150 MG/ML ~~LOC~~ SOAJ: SUBCUTANEOUS | 11 refills | Status: DC

## 2022-12-18 ENCOUNTER — Telehealth: Payer: Self-pay | Admitting: Cardiovascular Disease

## 2022-12-18 ENCOUNTER — Other Ambulatory Visit (HOSPITAL_COMMUNITY): Payer: Self-pay

## 2022-12-18 NOTE — Telephone Encounter (Signed)
Pt c/o medication issue:  1. Name of Medication: Alirocumab (PRALUENT) 150 MG/ML SOAJ   2. How are you currently taking this medication (dosage and times per day)? N/a  3. Are you having a reaction (difficulty breathing--STAT)? no  4. What is your medication issue? [facility] is calling in regards to PA that was faxed over for this medication. Please advise.

## 2022-12-20 ENCOUNTER — Telehealth: Payer: Self-pay | Admitting: Pharmacy Technician

## 2022-12-20 ENCOUNTER — Other Ambulatory Visit (HOSPITAL_COMMUNITY): Payer: Self-pay

## 2022-12-20 NOTE — Telephone Encounter (Signed)
Christy at [employer] called to follow-up on patient's prior authorization for Alirocumab (PRALUENT) 150 MG/ML SOAJ.  Ref# [id number].

## 2022-12-20 NOTE — Telephone Encounter (Signed)
[facility] following up on phone note from 11/18. Please advise

## 2022-12-20 NOTE — Telephone Encounter (Signed)
Pharmacy Patient Advocate Encounter   Received notification from Pt Calls Messages that prior authorization for praluent is required/requested.   Insurance verification completed.   The patient is insured through [employer] .   Per test claim: PA required; PA submitted to above mentioned insurance via CoverMyMeds Key/confirmation #/EOC [id number] Status is pending

## 2022-12-25 NOTE — Telephone Encounter (Signed)
[facility] is calling checking on Prior Auth. Please advise

## 2022-12-26 DIAGNOSIS — Z125 Encounter for screening for malignant neoplasm of prostate: Secondary | ICD-10-CM | POA: Diagnosis not present

## 2022-12-26 DIAGNOSIS — Z0000 Encounter for general adult medical examination without abnormal findings: Secondary | ICD-10-CM | POA: Diagnosis not present

## 2022-12-26 DIAGNOSIS — D485 Neoplasm of uncertain behavior of skin: Secondary | ICD-10-CM | POA: Diagnosis not present

## 2022-12-26 DIAGNOSIS — L449 Papulosquamous disorder, unspecified: Secondary | ICD-10-CM | POA: Diagnosis not present

## 2022-12-26 DIAGNOSIS — R7303 Prediabetes: Secondary | ICD-10-CM | POA: Diagnosis not present

## 2022-12-26 DIAGNOSIS — E7800 Pure hypercholesterolemia, unspecified: Secondary | ICD-10-CM | POA: Diagnosis not present

## 2022-12-26 LAB — LAB REPORT - SCANNED
A1c: 6.1
EGFR: 68
PSA, FREE: 1.46

## 2022-12-27 ENCOUNTER — Encounter: Payer: Self-pay | Admitting: Family Medicine

## 2022-12-27 ENCOUNTER — Other Ambulatory Visit (HOSPITAL_COMMUNITY): Payer: Self-pay

## 2022-12-31 NOTE — Telephone Encounter (Signed)
Pharmacy Patient Advocate Encounter  Received notification from [facility] that Prior Authorization for praluent has been APPROVED from 12/27/22 to 12/27/23   PA #/Case ID/Reference #: [id number]

## 2023-02-24 DIAGNOSIS — H5213 Myopia, bilateral: Secondary | ICD-10-CM | POA: Diagnosis not present

## 2023-03-22 NOTE — Progress Notes (Signed)
 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 He has not tolerated statins or zetia Due to muscle pain in legs He is very sensitive to this due to his biking   Discussed taking baby aspirin daily He feels full ASA making him jittery with palpitations when he bikes   Prior CV studies:   The following studies were reviewed today:  Carotid 12/13/16  Left 40-59% Right patent post CEA ETT 10/08/15 normal HTN  response to exercise ETT 11/25/18  Monitor 05/10/18   ECG:  03/29/2023  NSR rate 70 normal prior inferior T wave changes resolved   Past Medical History:  Diagnosis Date   Anxiety    Atypical chest pain    Carotid artery occlusion    Carotid stenosis 09/30/2013   Cerebral vascular disease 01/27/2013   Right - 40% to 59% ICA stenosis in the bulb lower end of scale. Vertebral artery flow is antegrade. - Left - 1% to 39% ICA stenosis. Vertebral artery flow is antegrade. 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   SEMI (subendocardial myocardial infarction) (HCC)    Statin intolerance 01/10/2013   TIA (transient ischemic attack) 09/22/2013   Past Surgical History:  Procedure Laterality Date   CERVICAL SPINE SURGERY  approx 2011   C3,C4, C5 diskectomy   CORONARY ARTERY BYPASS GRAFT N/A 01/15/2013   Procedure: CORONARY ARTERY BYPASS GRAFTING (CABG) TIMES FOUR USING LEFT INTERNAL MAMMARY ARTERY AND RIGHT SAPHENOUS LEG VEIN HARVESTED ENDOSCOPICALLY;  Surgeon: [name], MD;  Location: [facility] OR;  Service: Open Heart Surgery;  Laterality:  N/A;   ENDARTERECTOMY Right 09/30/2013   Procedure: Right Carotid Endarterectomy with Patch;  Surgeon: [name], MD;  Location: [facility] OR;  Service: Vascular;  Laterality: Right;   HAND SURGERY     I & D EXTREMITY Left 07/21/2013   Procedure: IRRIGATION AND DEBRIDEMENT EXTREMITY;  Surgeon: [name], MD;  Location: [facility];  Service: Orthopedics;  Laterality: Left;   INTRAOPERATIVE TRANSESOPHAGEAL ECHOCARDIOGRAM N/A 01/15/2013   Procedure: INTRAOPERATIVE TRANSESOPHAGEAL ECHOCARDIOGRAM;  Surgeon: [name], MD;  Location: [facility] OR;  Service: Open Heart Surgery;   Laterality: N/A;   LEFT HEART CATH N/A 01/11/2013   Procedure: LEFT HEART CATH;  Surgeon: [name], MD;  Location: [facility] CATH LAB;  Service: Cardiovascular;  Laterality: N/A;   TONSILLECTOMY       No outpatient medications have been marked as taking for the 03/29/23 encounter (Office Visit) with [name], MD.     Allergies:   Nexletol [bempedoic acid], Repatha [evolocumab], Statins, and Zetia [ezetimibe]   Social History   Tobacco Use   Smoking status: Never   Smokeless tobacco: Never  Vaping Use   Vaping status: Never Used  Substance Use Topics   Alcohol use: Yes    Alcohol/week: 0.0 standard drinks of alcohol    Comment: occasional   Drug use: No     Family Hx: The patient's family history includes Heart attack in his father; Heart disease in his father; Hyperlipidemia in his father; Hypertension in his father.  ROS:   Please see the history of present illness.     All other systems reviewed and are negative.   Labs/Other Tests and Data Reviewed:    Recent Labs: No results found for requested labs within last 365 days.   Recent Lipid Panel Lab Results  Component Value Date/Time   CHOL 137 09/17/2015 10:47 AM   CHOL 118 11/03/2013 12:11 PM   TRIG 110 09/17/2015 10:47 AM   TRIG 115 11/03/2013 12:11 PM   HDL 21 (L) 09/17/2015 10:47 AM   HDL 22 (L) 11/03/2013 12:11 PM   CHOLHDL 6.5 (H) 09/17/2015 10:47 AM   LDLCALC 94 09/17/2015 10:47 AM   LDLCALC 73 11/03/2013 12:11 PM    Wt Readings from Last 3 Encounters:  03/29/23 168 lb (76.2 kg)  03/29/22 167 lb 3.2 oz (75.8 kg)  11/18/19 161 lb 6.4 oz (73.2 kg)     Objective:    Vital Signs:  BP 120/84   Pulse 66   Ht 5\' 11"  (1.803 m)   Wt 168 lb (76.2 kg)   SpO2 95%   BMI 23.43 kg/m    Affect appropriate Healthy:  appears stated age HEENT: normal Neck supple with no adenopathy JVP normal  Post Right CEA  Lungs clear with no wheezing and good diaphragmatic motion Heart:  S1/S2 no murmur,  no rub, gallop or click PMI normal Abdomen: benighn, BS positve, no tenderness, no AAA no bruit.  No HSM or HJR Distal pulses intact with no bruits No edema Neuro non-focal Skin warm and dry No muscular weakness  ASSESSMENT & PLAN:    1.  CAD/CABG 01/16/13  No angina normal ETT October 2020 stable Baseline ECG with T changes in 3,F Today ECG totally normal  2.  Post right CEA:  05/05/22 plaque no stenosis update [place] 04/2024  3. HLD on Praluent  Intolerant to nexlitol, statins and Zetia LDL runs 110-120 4. HTN:  Well controlled.  Continue current medications and low sodium  Dash type diet.   5. Palpitations:  Junctional tachycardia seen by Ladona Ridgel PRN lopressor no ablation recommended  6. 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  Concerns regarding medicines are outlined above.  Tests Ordered:  None  Medication Changes: No orders of the defined types were placed in this encounter.   Disposition:  Follow up in a year   Signed, [name], MD  03/29/2023 3:06 PM    [facility]

## 2023-03-29 ENCOUNTER — Ambulatory Visit: Payer: BC Managed Care – PPO | Attending: Cardiovascular Disease | Admitting: Cardiovascular Disease

## 2023-03-29 VITALS — BP 120/84 | HR 66 | Ht 71.0 in | Wt 168.0 lb

## 2023-03-29 DIAGNOSIS — E785 Hyperlipidemia, unspecified: Secondary | ICD-10-CM

## 2023-03-29 DIAGNOSIS — Z951 Presence of aortocoronary bypass graft: Secondary | ICD-10-CM

## 2023-03-29 DIAGNOSIS — Z789 Other specified health status: Secondary | ICD-10-CM | POA: Diagnosis not present

## 2023-03-29 DIAGNOSIS — I6523 Occlusion and stenosis of bilateral carotid arteries: Secondary | ICD-10-CM | POA: Diagnosis not present

## 2023-03-29 NOTE — Patient Instructions (Addendum)

## 2023-04-25 ENCOUNTER — Other Ambulatory Visit (HOSPITAL_COMMUNITY): Payer: Self-pay

## 2023-04-25 ENCOUNTER — Telehealth: Payer: Self-pay | Admitting: Pharmacist

## 2023-04-25 ENCOUNTER — Telehealth: Payer: Self-pay | Admitting: Pharmacy Technician

## 2023-04-25 DIAGNOSIS — I6523 Occlusion and stenosis of bilateral carotid arteries: Secondary | ICD-10-CM

## 2023-04-25 DIAGNOSIS — Z951 Presence of aortocoronary bypass graft: Secondary | ICD-10-CM

## 2023-04-25 DIAGNOSIS — E785 Hyperlipidemia, unspecified: Secondary | ICD-10-CM

## 2023-04-25 NOTE — Telephone Encounter (Signed)
 Received fax that Praluent is not covered. Please complete PA for Repatha

## 2023-04-25 NOTE — Telephone Encounter (Signed)
 Pharmacy Patient Advocate Encounter   Received notification from Pt Calls Messages that prior authorization for repatha is required/requested.   Insurance verification completed.   The patient is insured through [facility] .   Per test claim: PA required; PA submitted to above mentioned insurance via CoverMyMeds Key/confirmation #/EOC [id number] Status is pending

## 2023-04-25 NOTE — Telephone Encounter (Signed)
 Pharmacy Patient Advocate Encounter  Received notification from [facility] that Prior Authorization for repatha has been APPROVED from 04/25/23 to 04/24/24. Ran test claim, Copay is $40.00- one month. This test claim was processed through [facility]- copay amounts may vary at other pharmacies due to pharmacy/plan contracts, or as the patient moves through the different stages of their insurance plan.   PA #/Case ID/Reference #: [id number]

## 2023-04-27 MED ORDER — REPATHA SURECLICK 140 MG/ML ~~LOC~~ SOAJ: 1.0000 mL | SUBCUTANEOUS | 1 refills | Status: DC

## 2023-04-27 NOTE — Addendum Note (Signed)
 Addended by: Cheree Ditto on: 04/27/2023 02:35 PM   Modules accepted: Orders

## 2023-04-27 NOTE — Telephone Encounter (Signed)
 Called patient to let him know insurance prefers Repatha this year. No answer, lmom

## 2023-12-07 ENCOUNTER — Other Ambulatory Visit: Payer: Self-pay | Admitting: Cardiovascular Disease

## 2023-12-07 DIAGNOSIS — E785 Hyperlipidemia, unspecified: Secondary | ICD-10-CM

## 2023-12-07 DIAGNOSIS — I214 Non-ST elevation (NSTEMI) myocardial infarction: Secondary | ICD-10-CM

## 2023-12-07 DIAGNOSIS — Z789 Other specified health status: Secondary | ICD-10-CM

## 2024-01-04 ENCOUNTER — Telehealth: Payer: Self-pay | Admitting: Pharmacy Technician

## 2024-01-04 ENCOUNTER — Other Ambulatory Visit (HOSPITAL_COMMUNITY): Payer: Self-pay

## 2024-01-04 NOTE — Telephone Encounter (Signed)
 Pharmacy Patient Advocate Encounter   Received notification from Pt Calls Messages that prior authorization for praluent  is required/requested.   Insurance verification completed.   The patient is insured through [facility].   Per test claim: PA required; PA submitted to above mentioned insurance via Latent Key/confirmation #/EOC [id number] Status is pending

## 2024-01-07 DIAGNOSIS — Z0000 Encounter for general adult medical examination without abnormal findings: Secondary | ICD-10-CM | POA: Diagnosis not present

## 2024-01-07 DIAGNOSIS — L821 Other seborrheic keratosis: Secondary | ICD-10-CM | POA: Diagnosis not present

## 2024-01-07 DIAGNOSIS — Z125 Encounter for screening for malignant neoplasm of prostate: Secondary | ICD-10-CM | POA: Diagnosis not present

## 2024-01-07 DIAGNOSIS — D485 Neoplasm of uncertain behavior of skin: Secondary | ICD-10-CM | POA: Diagnosis not present

## 2024-01-07 DIAGNOSIS — R7303 Prediabetes: Secondary | ICD-10-CM | POA: Diagnosis not present

## 2024-01-07 LAB — LAB REPORT - SCANNED
A1c: 6.1
EGFR: 68

## 2024-01-07 NOTE — Telephone Encounter (Signed)
 Pharmacy Patient Advocate Encounter  Received notification from [facility] that Prior Authorization for praluent  has been APPROVED from 01/04/24 to 01/03/25   PA #/Case ID/Reference #: [id number]

## 2024-01-08 ENCOUNTER — Ambulatory Visit: Payer: Self-pay | Admitting: Cardiovascular Disease

## 2024-01-11 DIAGNOSIS — E782 Mixed hyperlipidemia: Secondary | ICD-10-CM | POA: Diagnosis not present
